# Patient Record
Sex: Female | Born: 1970 | Race: White | Hispanic: No | Marital: Married
Health system: Midwestern US, Community
[De-identification: ages and names within clinical notes are randomized; demographics above are authoritative.]

## PROBLEM LIST (undated history)

## (undated) DIAGNOSIS — I209 Angina pectoris, unspecified: Secondary | ICD-10-CM

## (undated) DIAGNOSIS — R928 Other abnormal and inconclusive findings on diagnostic imaging of breast: Secondary | ICD-10-CM

## (undated) DIAGNOSIS — R1011 Right upper quadrant pain: Secondary | ICD-10-CM

## (undated) DIAGNOSIS — J4540 Moderate persistent asthma, uncomplicated: Secondary | ICD-10-CM

## (undated) DIAGNOSIS — T17308A Unspecified foreign body in larynx causing other injury, initial encounter: Secondary | ICD-10-CM

## (undated) DIAGNOSIS — R110 Nausea: Secondary | ICD-10-CM

## (undated) DIAGNOSIS — I2510 Atherosclerotic heart disease of native coronary artery without angina pectoris: Secondary | ICD-10-CM

## (undated) DIAGNOSIS — R9431 Abnormal electrocardiogram [ECG] [EKG]: Secondary | ICD-10-CM

## (undated) DIAGNOSIS — K219 Gastro-esophageal reflux disease without esophagitis: Secondary | ICD-10-CM

## (undated) DIAGNOSIS — N200 Calculus of kidney: Secondary | ICD-10-CM

## (undated) DIAGNOSIS — M546 Pain in thoracic spine: Secondary | ICD-10-CM

## (undated) DIAGNOSIS — G35 Multiple sclerosis: Secondary | ICD-10-CM

## (undated) DIAGNOSIS — K1379 Other lesions of oral mucosa: Secondary | ICD-10-CM

## (undated) DIAGNOSIS — Z1231 Encounter for screening mammogram for malignant neoplasm of breast: Secondary | ICD-10-CM

## (undated) DIAGNOSIS — M859 Disorder of bone density and structure, unspecified: Secondary | ICD-10-CM

## (undated) DIAGNOSIS — R52 Pain, unspecified: Secondary | ICD-10-CM

## (undated) DIAGNOSIS — S0990XA Unspecified injury of head, initial encounter: Secondary | ICD-10-CM

## (undated) DIAGNOSIS — R109 Unspecified abdominal pain: Secondary | ICD-10-CM

---

## 1998-02-17 NOTE — Op Note (Signed)
[facility]                                OPERATION REPORT   NAME:     [name] [name], [name] [name]   MR#:      [id number]                          DATE:   SS#                                         PT. LOCATION:   Leonia Reeves, M.D.   cc:   Leonia Reeves, M.D.   PREOPERATIVE DIAGNOSIS:   Left breast mass   Intrauterine pregnancy of about [redacted] weeks gestation   POSTOPERATIVE DIAGNOSIS:   Milk adenoma, left breast, by frozen section   PROCEDURE:   Incisional biopsy of left breast mass   SURGEON:   Burna Forts, MD   ANESTHESIA:   Dr. Deanne Coffer (IV sedation and monitoring)  and  1% lidocaine solution and .25%   Marcaine solution as local anesthetics   FINDINGS:   This 28 year old gravida 1 para 0 abortus  0 patient, who is about [redacted] weeks   gestation, presented with left breast  mass  which has progressively gotten   bigger.   Patient was concerned as she  has  a  family  history  of  breast   cancer.   At  this  point in her pregnancy,  after  discussion,  suggesting   biopsy be done after her pregnancy,  as  it  appeared  the  mass  could  be   benign, she requested biopsy as soon  as  possible  because  of  concern of   breast cancer.   The mass was located subareolarly at about  9:00 o'clock in the left breast   area.  Mass measures approximately 2.5  to  3 cm. in diameter.  It is firm,   nontender and there is no evidence of interim changes noted.   PROCEDURE:   With the patient under IV sedation, the entire left breast area was prepped   with Betadine and draped in sterile manner.   The mass was then marked with   marking pen and an outline of incision  was made with the marking pen along   the  areolar  margin  in  the 9:00 o'clock  position.   Then  1%  Xylocaine   solution was injected around the areolar  mass  and  incision was then made   through the skin and subcutaneous tissues  along  the  edge  of the areola.    The mass was identified and this was excised.  The bleeders were controlled   with  electrocautery.   With  good  hemostasis   achieved,  the  wound  was   irrigated with saline solution.  The  incision  was  then approximated with   2-0 chromic catgut.  Skin was closed  subcuticularly  with 4-0 nylon.  Skin   was  reinforced  with  __________________.   Sterile  dressings  were  than   applied over the wound.   Sponge count, needle count and instrument  count were correct at the  end of   the procedure.  Patient tolerated the  procedure  well  and  was  taken  to   recovery room in satisfactory condition.   Frozen section report by Dr. _____________ was "consistent with adenoma."   ___________________________   Leonia Reeves, M.D.   vmt  D:  02/17/1998  T:  03/03/1998  2:36 P   [id number]

## 1998-03-03 NOTE — Op Note (Signed)
 [facility]                                OPERATION REPORT   NAME:     [name] [name], [name] [name]   MR#:      [id number]                          DATE:   SS#                                         PT. LOCATION:   MADELIN MANNHEIM, M.D.   cc:   MADELIN MANNHEIM, M.D.   PREOPERATIVE DIAGNOSIS:   Left breast mass   Intrauterine pregnancy of about [redacted] weeks gestation   POSTOPERATIVE DIAGNOSIS:   Milk adenoma, left breast, by frozen section   PROCEDURE:   Incisional biopsy of left breast mass   SURGEON:   ALONSO MANNHEIM, MD   ANESTHESIA:   Dr. Herma (IV sedation and monitoring)  and  1% lidocaine solution and .25%   Marcaine solution as local anesthetics   FINDINGS:   This 28 year old gravida 1 para 0 abortus  0 patient, who is about [redacted] weeks   gestation, presented with left breast  mass  which has progressively gotten   bigger.   Patient was concerned as she  has  a  family  history  of  breast   cancer.   At  this  point in her pregnancy,  after  discussion,  suggesting   biopsy be done after her pregnancy,  as  it  appeared  the  mass  could  be   benign, she requested biopsy as soon  as  possible  because  of  concern of   breast cancer.   The mass was located subareolarly at about  9:00 o'clock in the left breast   area.  Mass measures approximately 2.5  to  3 cm. in diameter.  It is firm,   nontender and there is no evidence of interim changes noted.   PROCEDURE:   With the patient under IV sedation, the entire left breast area was prepped   with Betadine and draped in sterile manner.   The mass was then marked with   marking pen and an outline of incision  was made with the marking pen along   the  areolar  margin  in  the 9:00 o'clock  position.   Then  1%  Xylocaine   solution was injected around the areolar  mass  and  incision was then made   through the skin and subcutaneous tissues  along  the  edge  of the areola.   The mass was identified and this was excised.  The bleeders were  controlled   with  electrocautery.   With  good  hemostasis   achieved,  the  wound  was   irrigated with saline solution.  The  incision  was  then approximated with   2-0 chromic catgut.  Skin was closed  subcuticularly  with 4-0 nylon.  Skin   was  reinforced  with  __________________.   Sterile  dressings  were  than   applied over the wound.   Sponge count, needle count and instrument  count were correct at the  end of   the procedure.  Patient tolerated the  procedure  well  and  was  taken  to   recovery room in satisfactory condition.   Frozen section report by Dr. _____________ was consistent with adenoma.   ___________________________   MADELIN MANNHEIM, M.D.   vmt  D:  02/17/1998  T:  03/03/1998  2:36 P   [id number]

## 1999-04-24 NOTE — Op Note (Signed)
[name]                                OPERATION REPORT   NAME:         [name] [name], [name] [name]   MR #:         [id number]                    DATE:           04/24/1999   BILLING #:    [id number]                   PT. LOCATION:   OR  OR37   SS #          [id number]   JEFFREY Neoma Laming, M.D.   cc:   JEFFREY Neoma Laming, M.D.(2 COPIES)         Lynann Bologna, M.D.   PREOPERATIVE DIAGNOSIS:   1.  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   2.  Allergic rhinosinusitis.   POSTOPERATIVE DIAGNOSIS:   Same   OPERATION:   1.  Diagnostic nasal sinus endoscopy.   2.  Right endoscopic nasal polypectomies with Microdebrider.   3.  Bilateral endoscopic revision maxillary antrostomies with removal of         the mucous membrane contents.   4.  Bilateral endoscopic total ethmoidectomies with Microdebrider.   5.  Right endoscopic frontal sinusotomy with removal of polypoid tissue and         stenting of frontal nasal duct.   SURGEON:   Dr. Lowell Guitar   ASSISTANT:   Gae Gallop, RN   ANESTHESIA:   General endotracheal anesthesia, Dr. Manson Passey supplemented with 4% topical   cocaine, 2% lidocaine, with 1:100,000 epinephrine (6 cc) and 2% lidocaine   with 1:50,000 epinephrine (4 cc).   ESTIMATED BLOOD LOSS:   Minimal   PACKING:   Bilateral Merogel, bilateral Bactroban ointment, bilateral nasal packs   COMPLICATIONS:   None   INDICATIONS:   This is a pleasant 29 year old white female who is followed by  [facility] for sinonasal disease and   allergies.  She was initially seen on November 23, 1997 when her family   physician referred her for allergies and sinus problems.  In summary, she   was found to have nasal polyps and chronic sinusitis and multiple   allergies.  She had undergone past surgery at [facility] in   [place] in the form of septoplasty and some    sinus surgery, but were not able to obtain the old records.  She moved from   [place], [place] in May 1999 to [place].  She had been seen by   an allergist in [place] and was on allergy injections from age 63   years to age 76 years.  She complains of nasal congestion, sneezing, watery   itchy eyes and rhinorrhea.  She gets multiple sinus infections in the past.   Now she complains of left sided otalgia and tinnitus but no vertigo.   Past surgical history included endoscopic sinus surgery with inferior   turbinate resection in [place], [place] in January 1998.  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  We started her on Amoxicillin for an acute   infection, Nasonex nose spray, saline irrigation and Entex LA.  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  The ethmoid showed near complete opacification on the right   and partial on the left.  She also had near complete opacification of the   right maxillary sinus and a large polyp on the left maxillary sinus.  The   sphenoids were clear.  We also did an allergy workup on her November 23, 1998 and she was found to be a really allergic individual.  She had   positive reactions to white ash, [place] grass, blue grass, cedar, fescue,   Johnson grass, Lambs quarters, oak, pigweed, ragweed, red top, sheep's   sorrel, Timothy pollen, cat hair, dog hair, Alternaria, Aspergillus,   Cephalosporium, Fusarium, Helminthosporium, Hormodendrum, Penicillium,   Stephthelium, Candida, Trichophyte,  Epidermophyton and house dust mix and    dust mites.  Her immunoglobulin level showed a massive elevation of her Ig   to 279 with normal range of 0 to 59.  We started her again on intranasal   steroid sprays of Nasonex because of her nasal polyposis and switched her   to Allegra and started her on allergy shots.  Her nasal polyps decreased in   size, but have not completely resolved.  Because of her situation we have   recommended that we proceed with endoscopic sinus surgery.  She concurs   with that and because of the repeat infections, headaches and other   symptomatology has requested that we proceed.   Note #1.  Informed consent was obtained from the patient.   Note #2.  Preoperative vital signs - blood pressure 108/79, pulse 77,   respirations 16, temperature 97.5, height 5'0", weight 58 kg.   Note #3.  Preoperative lab data - CBC shows white blood cell count 9,200,   RBC 4.32, hemoglobin 13.2, hematocrit 38.8 with normal indices.  Platelets   379,000.  Prothrombin time for patient 13.1, control 13.9.  PTT for patient   34.0, control 31.5 with an INR of 0.9.  Urinalysis was within normal   limits.   DESCRIPTION OF PROCEDURE:  The patient was brought to the operating room   and placed in a supine position.  After satisfactory general anesthesia was   established via oral endotracheal intubation, the patient was given a gram   of Kefzol IV and 12 mg of Decadron IV.  She then had the vibrissae of her   nose clipped with Iris scissors.  The nose was then lightly packed with 4%   topical cocaine on cottonoids.  After satisfactory initiation of  mucosal   vasoconstriction the cottonoids were removed and the nasal septum was   infiltrated in the usual manner with a total of 6 cc of 2% Lidocaine with   1:100,000 epinephrine.  Then the lateral nasal wall and uncinate process on   each side were infiltrated with a total of 2 cc per side for a grand total   of 4 cc of 2% lidocaine with 1:50,000 epinephrine.  After this the patient  underwent a standard surgical prepping and draping of her face and nose for   endoscopic sinus surgery.   Surgery was begun by performing a careful diagnostic nasal sinus endoscopy   with the 0 degree endoscope.  The external nose was photographed showing   some lack of tip support.  The scope was passed into the left side of the   nose showing there had been a resection of the inferior turbinate partially   as well as partial resection of the middle turbinate.  There was a high   inferior wall to the maxillary antrostomy and there were large polyps in   the left maxillary sinus, especially anteriorly which looked to be filled   with mucopus.  The ethmoid had also polyps in its mucosal area.   The   eustachian tube orifice and nasopharynx were normal.  The scope was then   passed into the right side of the nose showing the right maxillary sinus to   be full of fluid and massive polyps.  The ethmoid was totally obliterated   with polyps and no frontal recess could even be seen.  The eustachian tube   orifice and nasopharynx were normal.  We felt we could work around the   septum without having to revise her septum at this time.   After this using the 0 degree endoscope we went into the left side of the   nose and lowered the inferior wall of the maxillary antrostomy and the back   wall.  Then switching to the 70 degree scope we went into the left   maxillary sinus and cleaned out all the diseased mucosa and the large   mucopyocele that was anteriorly.  After this with a Microdebrider selected   air cells were revised and cleansed in the left ethmoid.  Care was taken   not to disturb the frontal nasal duct which was clear on the CT scan.  We   did not disturb the osteoma since it was not obstructing.  We then entered   into the right side of the nose and with the Microdebrider removed all of   the free standing nasal polyps in the posterior nasal cavity.  Then we went    into the right maxillary sinus and with the Microdebrider removed all the   nasal polyps along with suction debridement and enlarged the maxillary   antrostomy opening with back biting and up biting Blakesley-Wilde forceps.   There were multiple large pus filled polyps especially in the roof of the   right maxillary sinus.  Once this was done we went with the Microdebrider   and revised the entire ethmoid.  A lot of the posterior ethmoid air cells   and superior cells and Arginase cells had not been removed.  These were all   filled with polyps.  Once all these cells were removed we found the frontal   nasal duct.  There was a large purulent polyp actually obstructing the   duct.  This polyp was then grabbed with Kuhne sinus forceps and removed   inferiorly, thus allowing release of the air fluid level in the right   frontal sinus.  Then using the 60 degree Microdebrider blade, we enlarged   the frontal nasal duct anteriorly and laterally.  We had a good wide open   duct and one could see some polypoid mucosal changes on the anterior table   and posterior table of the frontal sinus, but the drainage opening  was now   widely patent.  This drainage channel was then stented open with rolled   Gelfilm.  All sinus surgical sites were packed with Merogel, Bactroban   ointment, then bilateral nasal packs were applied, nasal drip pad was   applied and this completed the procedure on the patient.   The patient was then returned to anesthesia where the patient was awakened   from anesthesia, extubated and taken to the recovery room in excellent   condition.  Blood loss on the case was minimal and there were no   complications from the anesthetic or surgery; tolerated both quite well.   The family members were informed of the operative findings.  The   postoperative instructions were reviewed with them in detail.  The patient   was issued a typewritten sheet of instructions on her nasal and sinus    surgery along with three prescriptions:  Zithromax Pak to use one pack as   directed for five days.  Since she is sensitive to Percocet and Demerol we   are giving her Lortab.  We will try that for today, one tablet every four   hours as needed for pain and Phenergan 25 mg suppositories one q 6 hours   p.r.n. postoperative nausea.  We will have her in the office tomorrow   morning at 9 a.m. to remove her packing on Wednesday April 25, 1999.

## 1999-05-01 NOTE — Op Note (Signed)
 [name]                                OPERATION REPORT   NAME:         [name] [name], [name] [name]   MR #:         [id number]                    DATE:           04/24/1999   BILLING #:    [id number]                   PT. LOCATION:   OR  OR37   SS #          [id number]   [name] [name], M.D.   cc:   [name] [name], M.D.(2 COPIES)         [name] [name], M.D.   PREOPERATIVE DIAGNOSIS:   1.  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   2.  Allergic rhinosinusitis.   POSTOPERATIVE DIAGNOSIS:   Same   OPERATION:   1.  Diagnostic nasal sinus endoscopy.   2.  Right endoscopic nasal polypectomies with Microdebrider.   3.  Bilateral endoscopic revision maxillary antrostomies with removal of         the mucous membrane contents.   4.  Bilateral endoscopic total ethmoidectomies with Microdebrider.   5.  Right endoscopic frontal sinusotomy with removal of polypoid tissue and         stenting of frontal nasal duct.   SURGEON:   Dr. MONTE   ASSISTANT:   Gib Sharps, RN   ANESTHESIA:   General endotracheal anesthesia, Dr. Delores supplemented with 4% topical   cocaine, 2% lidocaine, with 1:100,000 epinephrine (6 cc) and 2% lidocaine   with 1:50,000 epinephrine (4 cc).   ESTIMATED BLOOD LOSS:   Minimal   PACKING:   Bilateral Merogel, bilateral Bactroban ointment, bilateral nasal packs   COMPLICATIONS:   None   INDICATIONS:   This is a pleasant 29 year old white female who is followed by  [name] for sinonasal disease and   allergies.  She was initially seen on November 23, 1997 when her family   physician referred her for allergies and sinus problems.  In summary, she   was found to have nasal polyps and chronic sinusitis and multiple   allergies.  She had undergone past surgery at [facility] in   [place], [place]  years ago in the form of septoplasty and some   sinus  surgery, but were not able to obtain the old records.  She moved from   [place], [place]  in May 1999 to [place].  She had been seen by   an allergist in [place]  and was on allergy injections from age 44   years to age 38 years.  She complains of nasal congestion, sneezing, watery   itchy eyes and rhinorrhea.  She gets multiple sinus infections in the past.   Now she complains of left sided otalgia and tinnitus but no vertigo.   Past surgical history included endoscopic sinus surgery with inferior   turbinate resection in [place], [place]  in January 1998.  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  We started her on Amoxicillin for an acute   infection, Nasonex nose spray, saline irrigation and Entex LA.  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  The ethmoid showed near complete opacification on the right   and partial on the left.  She also had near complete opacification of the   right maxillary sinus and a large polyp on the left maxillary sinus.  The   sphenoids were clear.  We also did an allergy workup on her November 23, 1998 and she was found to be a really allergic individual.  She had   positive reactions to white ash, [place] grass, blue grass, cedar, fescue,   Johnson grass, Lambs quarters, oak, pigweed, ragweed, red top, sheep's   sorrel, Timothy pollen, cat hair, dog hair, Alternaria, Aspergillus,   Cephalosporium, Fusarium, Helminthosporium, Hormodendrum, Penicillium,   Stephthelium, Candida, Trichophyte,  Epidermophyton and house dust mix and   dust mites.  Her immunoglobulin level showed a massive elevation of her Ig   to  279 with normal range of 0 to 59.  We started her again on intranasal   steroid sprays of Nasonex because of her nasal polyposis and switched her   to Allegra and started her on allergy shots.  Her nasal polyps decreased in   size, but have not completely resolved.  Because of her situation we have   recommended that we proceed with endoscopic sinus surgery.  She concurs   with that and because of the repeat infections, headaches and other   symptomatology has requested that we proceed.   Note #1.  Informed consent was obtained from the patient.   Note #2.  Preoperative vital signs - blood pressure 108/79, pulse 77,   respirations 16, temperature 97.5, height 5'0, weight 58 kg.   Note #3.  Preoperative lab data - CBC shows white blood cell count 9,200,   RBC 4.32, hemoglobin 13.2, hematocrit 38.8 with normal indices.  Platelets   379,000.  Prothrombin time for patient 13.1, control 13.9.  PTT for patient   34.0, control 31.5 with an INR of 0.9.  Urinalysis was within normal   limits.   DESCRIPTION OF PROCEDURE:  The patient was brought to the operating room   and placed in a supine position.  After satisfactory general anesthesia was   established via oral endotracheal intubation, the patient was given a gram   of Kefzol IV and 12 mg of Decadron IV.  She then had the vibrissae of her   nose clipped with Iris scissors.  The nose was then lightly packed with 4%   topical cocaine on cottonoids.  After satisfactory initiation of  mucosal   vasoconstriction the cottonoids were removed and the nasal septum was   infiltrated in the usual manner with a total of 6 cc of 2% Lidocaine with   1:100,000 epinephrine.  Then the lateral nasal wall and uncinate process on   each side were infiltrated with a total of 2 cc per side for a grand total   of 4 cc of 2% lidocaine with 1:50,000 epinephrine.  After this the patient   underwent  a standard surgical prepping and draping of her face and nose for   endoscopic sinus surgery.    Surgery was begun by performing a careful diagnostic nasal sinus endoscopy   with the 0 degree endoscope.  The external nose was photographed showing   some lack of tip support.  The scope was passed into the left side of the   nose showing there had been a resection of the inferior turbinate partially   as well as partial resection of the middle turbinate.  There was a high   inferior wall to the maxillary antrostomy and there were large polyps in   the left maxillary sinus, especially anteriorly which looked to be filled   with mucopus.  The ethmoid had also polyps in its mucosal area.   The   eustachian tube orifice and nasopharynx were normal.  The scope was then   passed into the right side of the nose showing the right maxillary sinus to   be full of fluid and massive polyps.  The ethmoid was totally obliterated   with polyps and no frontal recess could even be seen.  The eustachian tube   orifice and nasopharynx were normal.  We felt we could work around the   septum without having to revise her septum at this time.   After this using the 0 degree endoscope we went into the left side of the   nose and lowered the inferior wall of the maxillary antrostomy and the back   wall.  Then switching to the 70 degree scope we went into the left   maxillary sinus and cleaned out all the diseased mucosa and the large   mucopyocele that was anteriorly.  After this with a Microdebrider selected   air cells were revised and cleansed in the left ethmoid.  Care was taken   not to disturb the frontal nasal duct which was clear on the CT scan.  We   did not disturb the osteoma since it was not obstructing.  We then entered   into the right side of the nose and with the Microdebrider removed all of   the free standing nasal polyps in the posterior nasal cavity.  Then we went   into the right maxillary sinus and with the Microdebrider removed all the   nasal polyps along with suction debridement and enlarged the maxillary    antrostomy opening with back biting and up biting Blakesley-Wilde forceps.   There were multiple large pus filled polyps especially in the roof of the   right maxillary sinus.  Once this was done we went with the Microdebrider   and revised the entire ethmoid.  A lot of the posterior ethmoid air cells   and superior cells and Arginase cells had not been removed.  These were all   filled with polyps.  Once all these cells were removed we found the frontal   nasal duct.  There was a large purulent polyp actually obstructing the   duct.  This polyp was then grabbed with Kuhne sinus forceps and removed   inferiorly, thus allowing release of the air fluid level in the right   frontal sinus.  Then using the 60 degree Microdebrider blade, we enlarged   the frontal nasal duct anteriorly and laterally.  We had a good wide open   duct and one could see some polypoid mucosal changes on the anterior table   and posterior table of the frontal sinus, but the drainage opening  was now   widely patent.  This drainage channel was then stented open with rolled   Gelfilm.  All sinus surgical sites were packed with Merogel, Bactroban   ointment, then bilateral nasal packs were applied, nasal drip pad was   applied and this completed the procedure on the patient.   The patient was then returned to anesthesia where the patient was awakened   from anesthesia, extubated and taken to the recovery room in excellent   condition.  Blood loss on the case was minimal and there were no   complications from the anesthetic or surgery; tolerated both quite well.   The family members were informed of the operative findings.  The   postoperative instructions were reviewed with them in detail.  The patient   was issued a typewritten sheet of instructions on her nasal and sinus   surgery along with three prescriptions:  Zithromax Pak to use one pack as   directed for five days.  Since she is sensitive to Percocet and Demerol we   are giving her Lortab.   We will try that for today, one tablet every four   hours as needed for pain and Phenergan 25 mg suppositories one q 6 hours   p.r.n. postoperative nausea.  We will have her in the office tomorrow   morning at 9 a.m. to remove her packing on Wednesday April 25, 1999.

## 2000-03-14 NOTE — Progress Notes (Signed)
CORPORATE CARE CLINICAL SUMMARY   NAME:    [name] [name], [name] [name]                               SS#:        [id number]   [id number]   DOB:     08/29/1970                                   AGE:        30   SEX:     F                                            LOCATION:CORPORATE   CARE   MR#:     [id number]                                     DATE:       02/08/200   2   DICTATING PHYSICIAN:  Angus Palms, D.O.   REFERRING PHYS:   The patient is here for right maxillary pressure and drainage over the last   2-3 days, green, with feeling tired, mild cough but no fever or chills.   Also possibly pregnancy as she did a home test which was positive as her   menstrual cycle is five days late and she is usually very regular.  Gravida   I, 0,0.  No GI symptoms.   ALLERGIES:  Demerol.   MEDICATIONS:  None.   PHYSICAL EXAMINATION:   VITAL SIGNS:  Blood pressure 108/70, temperature 97.8, pulse 64,   respirations 16, 5'0" tall, 124 pounds.   HEENT:  Examination shows sinus redness and congestion, slightly sore high   anterior nodes.  Ears normal.  Throat red without pus.  She is hydrated.   Note no abdominal pain.   She is on allergy shots per Dr. Lowell Guitar.   ASSESSMENT:   1.  Amenorrhea.   2.  Maxillary sinusitis.   PLAN:  Amoxicillin 500 mg t.i.d. times ten days and urine HCG checked is   negative, compared to her home test, which was positive.  Will check serum   HCG then further advise and discuss what OTC medicines are safe in   pregnancy.

## 2000-07-25 NOTE — Progress Notes (Signed)
CORPORATE CARE CLINICAL SUMMARY   NAME:    [name] [name], [name] [name]                         SS#:        [id number]   DOB:     06/17/1970                             AGE:        30   SEX:     F                                      LOCATION:   CORPORATE CARE   MR#:     [id number]                               DATE:       07/25/2000   DICTATING PHYSICIAN:  Waynetta Sandy, M.D.   REFERRING PHYS:   CHIEF COMPLAINT:   Nasal congestion.   HISTORY OF PRESENT ILLNESS:   Ms. Borenstein is a 30 year old female with a past   medical history of fibromyalgia and allergies who presents with a two day   history of nasal congestion and sinus pressure.   The patient states that   she has associated headaches, sore throat, no cough noted though.   She has   not had any sputum production from her nose and denies any fever, chills,   but has tried Duratuss which she says has not helped at all.   In the past   she has tried Claritin D and Allegra but is currently on Clarinex.   She is   status post sinus surgery times two, most recent procedure done in 2001.   She has allergies to Demerol.   CURRENT MEDICATIONS:   Elavil, Neurontin, birth control pills, Clarinex and   Nasonex.   PHYSICAL EXAMINATION:   VITAL SIGNS:   Her temperature is 98.0, pulse 100, respirations 20, blood   pressure 80/60.   GENERAL:   She is a well developed, well nourished female who appears to be   in no acute distress.   HEENT:   Normocephalic and atraumatic.   Extraocular muscles are intact.   Pupils are equally round and reactive to light.   Nares are patent with   moist mucosal membranes.   Tympanic membranes are clear but mottled in   appearance.   No erythema presently.   No facial pressure to palpation in   the facial region but mild facial pressure to palpation in the right   maxillary sinus region.   NECK:   Is supple without lymphadenopathy.   No thyromegaly.   LUNGS:   Are clear to auscultation bilaterally.    CARDIOVASCULAR:   Regular rate and rhythm with no murmurs or gallops noted.   ASSESSMENT:   1.  Allergic rhinitis.   2.  Chronic sinusitis.   PLAN:   Patient was reassured that this does not appear to be a sinus infection   currently.   She is to continue her Duratuss as previously prescribed.   A   prescription was given to the patient for Z-Pak to be started if there was   an increase in her temperature or if the symptoms  continue to persist.   She is to follow-up with Dr. ___________ for change in her antihistamine.

## 2000-09-03 NOTE — Procedures (Signed)
[facility]                                 PROCEDURE NOTE   NAME:     [name] [name]   MR #:     [id number]                          DATE:   SS #      [id number]                       PT. LOCATION:     [id number]   DOUGLAS H. HOWERTON, M.D.   cc:   DOUGLAS H. HOWERTON, M.D.   Extra copies to office:  0   PROCEDURE:   Flexible sigmoidoscopy.   SURGEON:   Douglas H. Howerton, M.D.   INDICATIONS FOR PROCEDURE:   Abdominal pain, rectal bleeding, leukocytosis.   ASA score prior to the   procedure is 1.   MEDICATIONS:   Fentyl 75 mg IV.   Versed 2 mg IV.   FINDINGS:   After informed consent was obtained from the patient, the patient was   placed in the left lateral decubitus position and premedicated.   After the   patient was adequately sedated, PCF-140L colonoscope was passed to the mid   transverse colon, originally it was identified by triangular folds.   Slow   withdrawal of the pediatric colonoscope from this region revealed almost   confluent inflammation and ulcer extending from the splenic flexure to 37   centimeter above the dentate line.   There was surface ulceration was well.   There was eccentrically located.   Multiple biopsies were performed.   The   endoscopic appearance was consistent with mild ischemic colitis.   Internal   hemorrhoids were observed at the anorectal junction during endoscopic   withdrawal.   IMPRESSION:   1.  Mild ischemic colitis.   2.  Internal hemorrhoids.   PLAN:   1.  Full liquid diet.   2.  Continue p.r.n. antibiotics.   3.  Home tomorrow if stable.

## 2000-09-03 NOTE — Discharge Summary (Signed)
[facility]                                DISCHARGE SUMMARY   NAME:     [name] [name], [name] [name]   MR #:     [id number]                            ADM DATE:       09/03/2000   SS #      [id number]                         DIS DATE:       09/08/2000   Yisroel Ramming, M.D.   cc:   Yisroel Ramming, M.D.   PRINCIPAL DIAGNOSIS:   1.  Mild left sided ischemic colitis.   2.  Status post left inguinal hernia repair.   PLAN:   1.  Flagyl 250 mg q.i.d. for 4 days.   2.  Continue medication at home, including Neurontin, 900 mg q.h.s. and         Elavil 50 mg q.h.s.   3.  Avoid birth control pills in the future.   4.  Follow up p.r.n.   HISTORY OF PRESENT ILLNESS:    This is a pleasant 30 year old white female   who was admitted to [facility] by Dr. Perry Mount   09/03/2000 for evaluation of GI bleeding. This patient developed severe   infraumbilical and left sided abdominal pain, colicky in nature, followed   subsequently by a large volume repeated or recurrent rectal bleeding.  The   patient had no prior history of abdominal pain or GI bleeding.   PHYSICAL EXAMINATION:   The physical examination on admission revealed a   middle age female in mild distress.   VITAL SIGNS:  Blood pressure of 96/60,  pulse 60,respirations 16 and   temperature of 96.1 degrees Fahrenheit.   HEENT:  Examination was normal.   LUNGS:  The lungs were clear to auscultation.   CARDIAC:  Examination revealed a regular rate and rhythm with no murmur.   ABDOMEN:  The abdomen was soft, tender in the emergency room on the left   side, without a palpable mass.  Bowel sounds  were active throughout.   RECTAL:  Examination revealed Hemoccult positive brown stool.   EXTREMITIES:  The extremities were without cyanosis or edema.   ADMITTING LABORATORIES:  Included a white blood cell count of 14.5,   hemoglobin of 12.5, platelet count of 408,000, normal liver enzymes, BUN    was 9, and a creatinine of .9. Stool samples were all unremarkable   including Clostridium difficile toxin assays and O &amp; P stains.   Biopsy from the colon revealed changes consistent with either   pseudomembranous or ischemic colitis.   HOSPITAL COURSE:  The patient was admitted to the hospital  and underwent   flexible sigmoidoscopy to the mid  transverse colon.  This was performed   the day following admission.  Examination revealed inflammation in the left   colon from 30 centimeters to the splenic flexure, inflammation that was   nonconfluent and eccentrically located with surface ulceration, likely   related to ischemic colitis.  There were no pseudomembranes in the rest of   the colon. The mucosa above this region and below was normal.  Internal  hemorrhoids were visualized at the anorectal junction.   Note that biopsies   were performed in the area of presumed ischemic colitis prior to completion   of the examination.   The patient was admitted to a regular nursing floor where she was kept on a   liquid diet.  Over the next several days, the patient's  symptoms gradually   improved.   At the time of hospital discharge, she was tolerating liquid diet and was   not experiencing abdominal pain and had no further rectal bleeding.   DISCHARGE PLANS:  As dictated above, the patient will be  asked to return   on a p.r.n. basis in the future.

## 2000-09-03 NOTE — Consults (Signed)
[facility]                               CONSULTATION REPORT                     CONSULTANT:  Larene Beach, M.D.   NAME:             [name] [name]   BILLING #:        [id number]              DATE OF CONSULT:       09/03/2000   MR #:             [id number]               ADM DATE:              09/03/2000   SS #              [id number]            PT. LOCATION:   ATTENDING:        Angus Palms, D.O.   cc:   Angus Palms, D.O.         Larene Beach, M.D.   COPY TO [address]   INDICATIONS:    GI bleeding.   This is a 30 year old white female previously healthy except for   fibromyalgia and sinus problems who had acute onset of rectal bleeding   after eating pork chops last evening which has continued.   Patient then   was brought to the ER for further evaluation where a white count was noted   to be evaluated at 15,300.   She is admitted for observation.   Patient   denies previous episodes of this.   She does have a left shift with 86%   granulocytes and 13% bands.   PAST MEDICAL HISTORY:    Includes no known allergies.   OPERATION:   Left inguinal hernia repair.   MEDICATIONS:   Neurontin 900 mg at bed time and Elavil 50 mg at bed time.   She also takes birth control pills, takes Claritin occasionally.   ALLERGIES:    Demerol which produced hives.   SOCIAL HISTORY:   Nonsmoker, nondrinker, has a 65-year-old child.   REVIEW OF SYSTEMS:   Denies chronic shortness of breath, chest pain,   nausea, vomiting on a chronic basis, burning on urination.   She does have   fibromyalgia.   Denies depression, anxiety on a chronic basis.   PHYSICAL EXAMINATION:   VITAL SIGNS:   Blood pressure 96/60, pulse 60, respirations 16, temperature   96.1.   GENERAL:   She is a flush faced well developed, well nourished white female   in no acute distress.   HEENT:   Sclerae clear, conjunctivae pink, EOMs intact, tongue is   papillated.    NECK:   Supple, no bruits.   LUNGS:   Clear.   HEART:   S1, S2 is normal.   No murmurs, gallops.   ABDOMEN:   Soft, no active bowel sounds.   Tender in the ER, tender on my   examination.   No palpable masses or organomegaly.   Bowel sounds are   present.   RECTAL EXAM:   By ER revealed Hemoccult positive, but not grossly bloody   stool.   EXTREMITIES:  No cyanosis, clubbing, calf tenderness or edema.   IMPRESSION:   1.  Rule out infectious gastroenteritis versus ischemia versus diverticular      bleed, versus polyp, versus remote chance of CA or AVM.   2.  Positive family history, maternal grandmother of colon cancer.   SUGGESTED PLAN:   Admit for culture, antibiotics and flexible sigmoidoscopy and when well is   explained to the patient future colonoscopy.   The risks of the procedure   have been explained to the patient and her husband and agree to proceed.   Electronically Signed By:   Larene Beach, M.D. 09/11/2000 16:02   _________________________________   Larene Beach, M.D.   eb  D:  09/03/2000  T:  09/03/2000  8:19 P   [id number]

## 2000-09-03 NOTE — Progress Notes (Signed)
CORPORATE CARE CLINICAL SUMMARY   NAME:    [name] [name], [name] [name]                           SS#:        [id number]   DOB:     02-04-1971                             AGE:        30   SEX:     F                                      LOCATION:   CORPORATE CARE   MR#:     [id number]                               DATE:   DICTATING PHYSICIAN:  Angus Palms, D.O.   REFERRING PHYS:   The patient is here for evaluation of rectal bleed which started on arising   this morning with cramps.  She has no prior history of this, no history of   hemorrhoids, and she had no bowel movement yesterday, which has usually   been normal.  She has no upper respiratory infection symptoms, no fever, no   vomiting.  She notes the cramps are off and on and it has been all blood   coming out when she passed it a few times.  This happened no more than   three times.   ALLERGIES:  DEMEROL.   MEDICATIONS:  Elavil, Neurontin, birth control pills.   PHYSICAL EXAMINATION:   VITAL SIGNS:  Blood pressure 96/60, temperature 96.1, pulse 64,   respirations 16.  Weight 5' tall, 136 pounds.   Examination shows tender diffuse throughout the abdomen except in the right   upper quadrant.  No mass, no organomegaly.  Positive bowel sounds heard and   no rebound.  She is hydrated.  Rectal examination in negative for any mass   or hemorrhoids or lesions, and is heme positive.   ASSESSMENT:   1.  Abdominal pain left lower quadrant.   2.  Rectal bleed.   PLAN:  Will check CBC and would hope that this is self limiting as she did   eat ice cream late last night and did take an Imitrex tablet yesterday for   a headache.   She notes Imitrex and ice cream have never bothered her before but will see   what CBC shows.   May need referral to GI if this does not self limit and stop on its own and   pending the CBC results.

## 2000-09-03 NOTE — Progress Notes (Signed)
CORPORATE CARE CLINICAL SUMMARY   NAME:    [name] [name], [name] [name]                         SS#:        [id number]   DOB:     09-01-1970                             AGE:        30   SEX:     F                                      LOCATION:   CORPORATE CARE   MR#:     [id number]                               DATE:   DICTATING PHYSICIAN:  Angus Palms, D.O.   REFERRING PHYS:   The patient's CBC result came in showing high white count with left shift.   Discussed with Dr. Federico Flake who will see the patient in consult in the   Emergency Room for this rectal bleed with leukocytosis of acute nature.

## 2000-09-04 NOTE — Procedures (Signed)
[facility]                                 PROCEDURE NOTE   NAME:     [name] [name], [name] [name]   MR #:     [id number]                          DATE:   SS #      [id number]                       PT. LOCATION:     [id number]   Yisroel Ramming, M.D.   cc:   Yisroel Ramming, M.D.   Extra copies to office:  0   PROCEDURE:   Flexible sigmoidoscopy.   SURGEON:   Jaynee Eagles. Howerton, M.D.   INDICATIONS FOR PROCEDURE:   Abdominal pain, rectal bleeding, leukocytosis.   ASA score prior to the   procedure is 1.   MEDICATIONS:   Fentyl 75 mg IV.   Versed 2 mg IV.   FINDINGS:   After informed consent was obtained from the patient, the patient was   placed in the left lateral decubitus position and premedicated.   After the   patient was adequately sedated, PCF-140L colonoscope was passed to the mid   transverse colon, originally it was identified by triangular folds.   Slow   withdrawal of the pediatric colonoscope from this region revealed almost   confluent inflammation and ulcer extending from the splenic flexure to 37   centimeter above the dentate line.   There was surface ulceration was well.   There was eccentrically located.   Multiple biopsies were performed.   The   endoscopic appearance was consistent with mild ischemic colitis.   Internal   hemorrhoids were observed at the anorectal junction during endoscopic   withdrawal.   IMPRESSION:   1.  Mild ischemic colitis.   2.  Internal hemorrhoids.   PLAN:   1.  Full liquid diet.   2.  Continue p.r.n. antibiotics.   3.  Home tomorrow if stable.

## 2001-01-08 NOTE — Progress Notes (Signed)
CORPORATE CARE CLINICAL SUMMARY   NAME:  [name] [name], [name] [name]                          SS#:        [id number]   DOB:    05-Mar-1970                               AGE:        30   SEX:    F                                     LOCATION:  CORPORATE CARE   MR#:    [id number]                                 DATE:       01/08/2001   DICTATING PHYSICIAN:  Otilio Carpen, M.D.   REFERRING PHYS:   The patient is a 30 -year-old white female who presents with complaints of   sinus congestion and sinus pressure.  Symptoms have been worsening over the   past several days.  OTC meds are not working.  The patient initially had   sore throat pain. She has thick greenish nasal discharge.  No fever or   chills.  No nausea or vomiting.  She has had occasional loose stools.   PAST MEDICAL HISTORY:  Is significant for fibromyalgia.   MEDICATIONS:  amitriptyline 50 milligrams p.o. daily, Neurontin 900   milligrams p.o. daily, Vioxx 50 milligrams p.o. q.day.   THE PATIENT IS ALLERGIC TO DEMEROL.   LMP was December 07, 2000 and was normal for her.  PHYSICAL EXAMINATION:   VITAL SIGNS:  Blood pressure is 124/82, pulse 76, respirations 18.   Temperature 97.8.  Height 5\' 0" .  Weight 143.   HEENT:  Normocephalic, atraumatic, PERRLA, EOMI.  Nose with boggy mucous   membranes and mucous discharge noted.  Sinuses tender to palpation left   greater than right.  Posterior pharynx clear.  TMs clear.  O significant   adenopathy.  HEENT otherwise within normal limits.   NECK:  Supple and nontender.  No significant cervical or supraclavicular   adenopathy.   HEART:  Is regular rate and rhythm without murmur.   LUNGS:  Clear to auscultation bilaterally.   EXTREMITIES:  Normal and nontender.   NEUROLOGICAL:  Intact.   ASSESSMENT:   1. Sinusitis, acute.   PLAN:   1. p.o. fluids, rest, Tylenol p.r.n.   2. Amoxicillin 500 milligrams p.o. t.i.d. x 10 days.   3. Sudafed p.r.n. for congestion.    4. Humibid-LA 1 p.o. b.i.d. p.r.n. congestion or cough.   5.  Call INI 5-7 days, prior if worse, and p.r.n.  Routine follow up   otherwise.   Electronically Signed By:   Otilio Carpen, M.D. 12/28/2001 10:14   _____________________________________________   Otilio Carpen, M.D.   hp  D: 01/08/2001  T: 01/08/2001  8:41 P    [id number]

## 2001-01-22 NOTE — Progress Notes (Signed)
CORPORATE CARE CLINICAL SUMMARY   NAME:  [name] [name], [name] [name]                       SS#:        [id number]   DOB:    11-22-1970                              AGE:        30   SEX:    F                                      LOCATION:  CORPORATE CARE   MR#:    [id number]                                DATE:       01/22/2001   DICTATING PHYSICIAN:  Angus Palms, D.O.   REFERRING PHYS:   The patient here with complaint of missed cycle 3 weeks now and notes that   she did 3 home test which were negative and wanted to be sure with blood   test.   She went off birth control in July of 2002 after her colitis.  She has had   a menses each month though slightly irregular.   She notes a lot of stress going on and 3 weeks ago she did have a full   blown sinus infection, treated with antibiotics.  This was when her last   cycle was due.  She is Para 1, 00 1.   ALLERGIES:  Demerol.   MEDICATIONS:  Amitriptyline, Neurontin, Vioxx.   PHYSICAL EXAMINATION   VITAL SIGNS:   Blood pressure 120/76, temperature 98.2, pulse 76,   respirations 18, height 5'2", weight 136 pounds.  Last menstrual period   12/07/00.   ABDOMEN:  Soft, nontender.  No masses or organomegaly.  No thyromegaly. No   lymphadenopathy.   GENITOURINARY:  No bladder or bowel difficulties.   ASSESSMENT:  Amenorrhea.   PLAN:  Will check serum hCG and then give further advise.   Electronically Signed By:   Angus Palms, D.O. 01/26/2001 08:30   _____________________________________________   Angus Palms, D.O.   jdm  D: 01/22/2001  T: 01/23/2001  4:56 P    [id number]

## 2001-02-26 NOTE — Progress Notes (Signed)
CORPORATE CARE CLINICAL SUMMARY   NAME:  [name] [name], [name] [name]                       SS#:        [id number]   DOB:    1970-07-03                              AGE:        30   SEX:    F                                      LOCATION:  CORPORATE CARE   MR#:    [id number]                                DATE:       02/26/2001   DICTATING PHYSICIAN:  Angus Palms, D.O.   The patient called noting she is having sleeping problems and wants to know   if her medicine can be changed.   She is currently on Amitriptyline and Neurontin.   Advised the patient to decrease the Amitriptyline to 25 mg q. hs and we   will add Ambien 10 mg #30 to take 1 p.o. q. hs p.r.n. sleep and let me know   if this works by next week.   Made a call to her pharmacy, [phone].   Electronically Signed By:   Angus Palms, D.O. 04/15/2001 16:46   _____________________________________________   Angus Palms, D.O.   sp  D: 02/26/2001  T: 02/27/2001  9:59 A    [id number]

## 2001-03-03 NOTE — Progress Notes (Signed)
CORPORATE CARE CLINICAL SUMMARY   NAME:  [name] [name], [name] [name]                          SS#:        [id number]   DOB:    Sep 02, 1970                               AGE:        30   SEX:    F                                     LOCATION:  CORPORATE CARE   MR#:    [id number]                                 DATE:       03/04/2001   DICTATING PHYSICIAN:  Otilio Carpen, M.D.   The patient is a 31 year old female who presents with complaints of sinus   congestion and sinus pressure in the left maxillary sinus area.  She also   complains of left ear pain.  She denies any fever or chills.  No nausea,   vomiting or diarrhea.  She does have a nasal discharge.   PAST MEDICAL HISTORY:  Significant for fibromyalgia and allergic rhinitis.   MEDICATIONS:   1. Neurontin 900 mg p.o. daily.   2. Elavil 25 mg q. h.s.   3. Ambien 5 mg q. h.s.   ALLERGIES:  Demerol.   The patient states that she has Nasonex and Claritin at home but she has   not been using this.  She also complains of having difficulty following   asleep.  She states this is relieved with the Ambien although now she feels   too groggy with this.   PHYSICAL EXAMINATION:  Blood pressure 100/60, pulse 84, respirations 20,   temperature 98.4, height 5'0", weight 142.   HEENT:  NC/AT, Pupils equal, round, reactive to light and accommodation,   extraocular movements are intact.  Nose with boggy mucous membranes and   mucous discharge noted.  Sinuses are tender to palpation and percussion.   Posterior pharynx clear.  TMs clear.  No significant adenopathy.  HEENT   otherwise within normal limits.   NECK:  Supple and nontender.  No significant cervical or supraclavicular   adenopathy.   HEART:  Regular rate and rhythm without murmur.   LUNGS:  Clear to auscultation bilaterally.   ABDOMEN:  Benign.   EXTREMITIES:  Normal and nontender.   NEUROLOGICAL:  Intact.   ASSESSMENT:   1. Sinusitis, acute.   2. Left otalgia possibly secondary to #1.   3. Allergic rhinitis.    4. Sleep disturbance with intermittent insomnia.   PLAN:   1. P.o. fluids, rest, Tylenol p.r.n.   2. Amoxicillin 500 mg p.o. t.i.d. x 10 days.  Humibid-LA 1 p.o. b.i.d.      p.r.n. congestion or cough.  Sudafed p.r.n. congestion.   3. The patient is to call if she is not improved over the next 5 to 7 days,      prior if worse, and p.r.n.   4. The patient is instructed to decrease her Ambien dose to   tablet and to  only take it on a p.r.n. basis as opposed to taking it daily.  She is to      see if this improves her symptoms and controls her sleep problems as      well.  Routine follow up otherwise.   Electronically Signed By:   Otilio Carpen, M.D. 12/28/2001 10:38   _____________________________________________   Otilio Carpen, M.D.   sp  D: 03/04/2001  T: 03/04/2001  9:57 A    [id number]

## 2001-03-26 NOTE — Progress Notes (Signed)
CORPORATE CARE CLINICAL SUMMARY   NAME:  [name] [name], [name] [name]                          SS#:        [id number]   DOB:    March 17, 1970                               AGE:        30   SEX:    F                                     LOCATION:  CORPORATE CARE   MR#:    [id number]                                 DATE:       03/26/2001   DICTATING PHYSICIAN:  Otilio Carpen, M.D.   The patient is a 31 year old female who presents with complaints of   headache since yesterday evening.  This is consistent with her usual   migraine.  She denies any fever or chills.  No vomiting or diarrhea.  She   does have slight nausea.  Her headache is right-sided in nature.  The   patient does have an ongoing symptom of having a bad taste in her mouth   prior to her headaches and she had this yesterday.  Her headache started   yesterday evening.  She had difficulty sleeping last night due to the   headache.  She rates her headache a 5/10 on a scale of 1 to 10 with 10   being the worst headache she has had.  Imitrex has worked for her in the   past.   PAST MEDICAL HISTORY:  Significant for fibromyalgia and allergic rhinitis.   ALLERGIES:  Demerol.   MEDICATIONS:   1. Neurontin 900 mg p.o. q.d.   2. Elavil 25 mg p.o. q. h.s.   3. Ambien 10 mg p.o. q. h.s.   REVIEW OF SYSTEMS:  Reveals that the patient was unable to rest well with   the   dose of Ambien, so is now back up to 10 mg as needed.  She denies any   numbness or weakness, coordination or balance changes or slurred speech.   Symptoms are like her usual migraines.   Last menstrual period was 03/01/01.   PHYSICAL EXAMINATION:  Blood pressure 94/66, pulse 72, respirations 16,   temperature 98.5, height 5'0", weight 141.   HEENT:  NC/AT, Pupils equal, round, reactive to light and accommodation,   extraocular movements are intact, fundi benign.  Nose with clear discharge   and boggy mucous membranes.  No significant adenopathy.  HEENT otherwise   within normal limits.    NECK:  Supple and nontender.  No significant cervical or supraclavicular   adenopathy.   HEART:  Regular rate and rhythm without murmur.   LUNGS:  Clear to auscultation bilaterally.   ABDOMEN:  Normoactive bowel sounds, soft, nontender, nondistended, without   masses or hepatosplenomegaly.   EXTREMITIES:  Normal and nontender.  Motor 5/5.  No sensory or motor   deficits.   NEUROLOGICAL:  Cranial nerves II-XII intact bilaterally.  The patient   appears not to feel well but  does not look toxic.   ASSESSMENT:   1. Migraine headache.   2. Nausea secondary to #1.   3. Fibromyalgia.   PLAN:   1. Toradol 30 mg intramuscular x 1 dose.  The patient is noted to tolerate      this well without adverse effect.   2. P.o. fluids, rest, Tylenol p.r.n.   3. Phenergan 25 mg p.o. q.6h. p.r.n. nausea (14 with no refill).   4. Imitrex 25 mg.  The patient is instructed to take 1 p.o. p.r.n.      migraine.  She may repeat in 2 hours as directed if needed to relieve      headache.   5. The patient is to call if she is not improved over the next 3 days,      prior if worse, and p.r.n.  Routine follow up otherwise.   Electronically Signed By:   Otilio Carpen, M.D. 12/28/2001 11:32   _____________________________________________   Otilio Carpen, M.D.   sp  D: 03/26/2001  T: 03/27/2001 10:54 A    [id number]

## 2001-04-09 NOTE — Progress Notes (Signed)
CORPORATE CARE CLINICAL SUMMARY   NAME:  [name] [name], [name] [name]                          SS#:        [id number]   DOB:    1970-06-21                               AGE:        31   SEX:    F                                     LOCATION:  CORPORATE CARE   MR#:    [id number]                                 DATE:       04/09/2001   DICTATING PHYSICIAN:  Otilio Carpen, M.D.   REFERRING PHYS:   The patient is a 31 -year-old female who presents with complaints of   congestion and cough.  She has had subjective fever and chills.  No nausea,   vomiting or diarrhea.  She has clear discharge.  Cough is dry.  Symptoms   have been present over the past 4 days.  OTC meds are not working.   PAST MEDICAL HISTORY:  Is significant for fibromyalgia and seasonal   allergies.   MEDICATIONS:  Neurontin 900 milligrams p.o. q.h.s., Claritin-D, Elavil 50   milligrams p.o. q.h.s.   ALLERGIES:  The patient is allergic to Demerol.   LMP was April 01, 2001 and normal for her.  The patient states that she   ran out of her Ambien. She had reduced her Elavil from 50 milligrams to 25   milligrams when she started taking her Ambien for sleep.  She has now   increased her Elavil back to 50 milligrams and feels that this is helping   her sleep.  She does not feel she needs the Ambien at present.   PHYSICAL EXAMINATION: VITAL SIGNS:  Blood pressure is 112/64, pulse 76,   respirations 18.  Temperature 99.9.  Height 5\' 3" .  Weight 138.   HEENT:  Normocephalic, atraumatic, PERRLA, EOMI.  Nose with boggy mucous   membranes and clear discharge.  Posterior pharynx clear.  TMs clear.  No   significant adenopathy.  HEENT otherwise within normal limits.   NECK:  Supple and nontender.  No significant cervical or supraclavicular   adenopathy.   HEART:  Is regular rate and rhythm.   LUNGS:  With coarse upper airway sounds, otherwise clear.   ABDOMEN:  Benign.   EXTREMITIES:  Normal and nontender.   NEUROLOGICAL:  Intact.   ASSESSMENT:    1. Upper respiratory infection.   2. Allergic rhinitis.   3. Fibromyalgia.   PLAN:   1. p.o. fluids, rest, Tylenol p.r.n.   2. Humibid-LA 1 p.o. b.i.d. p.r.n. for congestion or cough.  Claritin-D      daily as directed.   3. The patient is given a refill on her Elavil 50 milligrams p.o. q.h.s.      (30 with 1 refill).   4. The patient is to call if she is not improved in regard to her upper      respiratory  symptoms in the next 5 to 7 days, prior if worse, and p.r.n.      Routine follow up otherwise.   Electronically Signed By:   Otilio Carpen, M.D. 12/28/2001 12:41   _____________________________________________   Otilio Carpen, M.D.   hp  D: 04/09/2001  T: 04/09/2001  6:40 P    [id number]

## 2001-04-09 NOTE — Progress Notes (Signed)
CORPORATE CARE CLINICAL SUMMARY   NAME:  [name] [name], [name] [name]                          SS#:        [id number]   DOB:    08/12/1970                               AGE:        31   SEX:    F                                     LOCATION:  CORPORATE CARE   MR#:    [id number]                                 DATE:   DICTATING PHYSICIAN:  Angus Palms, D.O.   REFERRING PHYS:   The patient called requesting refill on her Neurontin and okayed Neurontin   300 milligrams #90 to take 1 p.o. t.i.d. with 2 refills.  Medication called   into [facility], [id number].   Electronically Signed By:   Angus Palms, D.O. 10/24/2001 09:25   _____________________________________________   Angus Palms, D.O.   hp  D: 04/30/2001  T: 04/30/2001  9:15 P    [id number]

## 2001-05-06 NOTE — Progress Notes (Signed)
CORPORATE CARE CLINICAL SUMMARY   NAME:  [name] [name], [name] [name]                          SS#:        [id number]   DOB:    Mar 12, 1970                               AGE:        31   SEX:    F                                     LOCATION:  CORPORATE CARE   MR#:    [id number]                                 DATE:       05/06/2001   DICTATING PHYSICIAN:  Angus Palms, D.O.   REFERRING PHYS:   Patient here for evaluation of ongoing diarrhea.  She notes she went to   [facility] several days ago for sinus infection and was treated with   Augmentin and within 24 hours, got diarrhea and nausea which has been   ongoing with any food or fluid she takes in her.  She is trying to keep up   hydration.   She notes she is pregnant with several home pregnancy tests and did see her   obstetrician 2 days ago.   She notes no fever, no chills, no bleeding and symptoms have been 5 days   total.   She was hospitalized for 5 days last summer with colitis and had to take   Flagyl.   ALLERGIES:   See chart.   MEDICATIONS:  See chart.   PHYSICAL EXAMINATION:   VITAL SIGNS:   BP 110/80, temperature 98.1, pulse 100, respirations 20, 5   feet, 131 pounds.   LMP 03/27/01.   ABDOMEN:  She has tenderness diffusely of the abdomen equally with no mass,   no organomegaly, no icterus or jaundice.   HEENT:  :  Her oral membranes appear moist.  ENT exam is negative.   NECK:  No lymphadenopathy.   HEART:  Regular without murmur.   LUNGS:  Clear.   ASSESSMENT:   Nausea, diarrhea and dehydration.   PLAN:  Urine test done does show ketones and CBC done STAT shows no anemia   with good white count.   Patient given off work note and today was treated with IV D-5W of 1 liter   over 3 hours.   Patient felt better afterwards and had no orthostasis and her urine test   after IV showed all normal, no ketones.   Patient given Phenergan 25 mg p.o. x 1 on presentation and rested and slept    a few hours while she got the IV and felt better with nausea.   She notes she has lost 7 pounds over 4 days and will slowly increase diet   with the BRAT diet which she is aware of.   Depending on the response of the colitis, if it resolves on its own, she   will be set and if not, she will contact OB about safe medicines to treat   the colitis such as Flagyl or Cipro.  She will keep in touch with me on   this too.   Note:  Urine test after IV normal.  CBC normal also.   Electronically Signed By:   Angus Palms, D.O. 11/10/2001 11:15   _____________________________________________   Angus Palms, D.O.   [email or web address].[email or web address]  D: 05/06/2001  T: 05/06/2001  7:40 P    [id number]

## 2001-05-07 NOTE — ED Provider Notes (Signed)
[facility]                      EMERGENCY DEPARTMENT TREATMENT REPORT   NAME:  WAVERLEY, KREMPASKY   MR #:         BILLING #: [id number]          DOS: 05/07/2001   TIME: 9:56 P   40-03-62   cc:    Angus Palms, D.O.          Karl Pock, M.D.   Primary Physician:  Tresa Endo, M.D.   TIME:   2100 hours.   CHIEF COMPLAINT:   Nausea, vomiting.   HISTORY OF PRESENT ILLNESS:   This is a 31 year old female that arrived to   the emergency department stating that she went to see Dr. Tresa Endo for a sinus   infection and found out that she was pregnant.  Last menstrual period   03/27/01.  Was given Augmentin and after the first dose, Sunday morning she   started having diarrhea and called Dr. Tresa Endo and stopped taking her   antibiotics as she has had a past medical history of pseudomembranous   colitis that she was admitted for in July 2002.  She also saw Dr. [name] for   her pregnancy.  He redid the urinalysis and it was positive. Tried to do a   vaginal ultrasound but stated that it was too early.  She wanted to know   what she could do for the diarrhea and he told her to take Kaopectate.   That did not work and 48 hours afterwards she went to see Dr. Tresa Endo,   received a liter of fluids with Phenergan and this was on Wednesday, and   today she is back because she has not felt better.  Has had nausea and   vomiting.  No fevers.  No sore throat.  No earaches.  Still has the sinus   tenderness.  No vaginal bleeding.   REVIEW OF SYSTEMS:   CONSTITUTIONAL:  No fever, chills, weight loss.   ENT: No sore throat, runny nose or other URI symptoms.   RESPIRATORY:  No cough, shortness of breath, or wheezing.   CARDIOVASCULAR:  No chest pain, chest pressure, or palpitations.   GASTROINTESTINAL: The patient complaining of vomiting and diarrhea but no   abdominal pain.   GENITOURINARY:  No dysuria, frequency, or urgency.   INTEGUMENTARY:  No rashes.   PAST MEDICAL HISTORY:  Fibromyalgia, pseudomembranous colitis.    FAMILY HISTORY:  Negative.   SOCIAL HISTORY: Negative.   ALLERGIES:  Demerol.   Last menstrual period 03/27/01.  Has a positive pregnancy via urinalysis x   2.   PHYSICAL EXAMINATION:   GENERAL APPEARANCE:  The patient appears well developed and well nourished.   Appearance and behavior are age and situation appropriate.   HEENT:   Head is normocephalic and atraumatic.  Eyes:  Conjunctivae clear,   lids normal.  Pupils equal, symmetrical, and normally reactive.   Ears/Nose:  Hearing is grossly intact to voice.  Internal and external   examinations of the ears are unremarkable.   Mouth/Throat:  Surfaces of the   pharynx, palate, and tongue are pink, moist, and without lesions.   NECK:  Supple, nontender, symmetrical, no masses or JVD, trachea midline,   thyroid not enlarged, nodular, or tender.   LYMPHATIC:  No cervical or submandibular lymphadenopathy palpated.   RESPIRATORY:  Clear and equal breath sounds.  No respiratory distress,  tachypnea, or accessory muscle use.  Chest percussion normal.   CARDIOVASCULAR:  Heart regular, without murmurs, gallops, rubs, or thrills.   PMI not displaced. Carotid pulses 2+ and equal bilaterally without bruits.   DP pulses 2+ and equal bilaterally.   GI:  Abdomen soft, nontender, without complaint of pain to palpation.  No   hepatomegaly or splenomegaly.   SKIN:  Warm and dry without rashes.   NEUROLOGIC:  Cranial nerves, deep tendon reflexes, strength, and light   touch sensation are unremarkable.   IMPRESSION/MANAGEMENT PLAN:   The patient will have a laboratory   urinalysis, i-STAT, normal saline wide open, will be given Reglan 10 mg IV   may repeat x 1, orthostatics after the first liter, sips of liquids and   Tylenol 975 p.o.   CONTINUATION BY DR. FRUMKIN:   H&amp;P above by Mr. [place].   My history and examination:  Basically persistent diarrhea after Augmentin   and persistent vomiting after some IV fluid therapy. Diarrhea was actually    gone since this morning.  Admits to lightheadedness with standing, no   significant abdominal pain, no bleeding or discharge. Past history   remarkable for fibromyalgia and she has recently stopped taking those   medications (Neurontin and Elavil).   MY EXAMINATION:   GENERAL DESCRIPTION:   A healthy-appearing 31 year old woman in minimal   distress.   HEENT:  Pupils equal and reactive, extraocular movements normal.  Nose and   throat unremarkable.   NECK:  Supple, no significant cervical lymphadenopathy.  No JVD.   LUNGS:  Clear and equal breath sounds.  No respiratory distress or   tachypnea.   HEART:  Regular without significant murmurs, gallops, or rubs.   BACK:  No CVAT.   EXTREMITIES:   Calves soft and nontender.  No peripheral edema.  Pulses   satisfactory.   ABDOMEN:  Soft, nontender, without complaint of pain to palpation.   INITIAL ASSESSMENT AND MANAGEMENT PLAN:  Recurrent vomiting, volume   depletion most likely with intrauterine pregnancy we want to maximize her   volume status.   COURSE IN THE EMERGENCY DEPARTMENT:   The patient was given a liter of IV   fluids, 10 mg of Reglan and sips of liquids. At the completion of that   therapy she was able to take by mouth and was not orthostatic.   DIAGNOSTIC STUDIES:   An i-STAT was unremarkable with BUN 7, glucose 90,   hematocrit 46, electrolytes normal, CO2 is 26.  Urinalysis showed some   ketones and a specific gravity of 1.020 with moderate leukocyte esterase   which was largely epithelial cells (cultured).  Has 30-50 epithelial cells,   5-9 white cells, 3+ bacteria (no urinary symptoms).   SUBSEQUENT COURSE IN THE EMERGENCY DEPARTMENT:    For the ketonuria the   patient was given a second liter of fluids and felt well until she stood up   and tried to walk to the bathroom when she had some diarrhea.  She has not   had any diarrhea otherwise today.  At disposition she still had no   abdominal pain, no nausea and was taking by mouth.    Repeat abdominal exam was entirely nontender.   FINAL DIAGNOSES:   1. Vomiting and diarrhea.   2. Early pregnancy.   3. Volume depletion.   DISPOSITION/PLAN: Discussed this may be gastroenteritis versus vomiting   from pregnancy which she had with her previous pregnancy. She is given   prescriptions  for Phenergan suppositories 25 mg #3 with 1 refill to take   that or to take Reglan 10 mg p.o. #15 with 1 refill for her nausea. She is   advised Imodium and bedrest for her diarrhea.  To return specifically for   increase in pain, inability to take by mouth or orthostasis, advised to   call her gynecologist in the morning.   Electronically Signed By:   Shanna Cisco, M.D. 05/08/2001 17:14   ____________________________   Shanna Cisco, M.D.   dh/jdm  D:  05/07/2001  T:  05/07/2001 10:28 P   [id number]   Hilaria Ota, PA-C

## 2001-05-09 NOTE — ED Provider Notes (Signed)
[facility]                      EMERGENCY DEPARTMENT TREATMENT REPORT   ADMISSION   NAME:  [name] [name], [name] [name]   MR #:         BILLING #: [id number]          DOS: 05/09/2001   TIME: 2:20 P   40-03-62   cc:    Angus Palms, D.O.          Karl Pock, M.D.   Primary Physician:   CHIEF COMPLAINT: Still vomiting.   HISTORY OF PRESENT ILLNESS:  The patient is a 31 year old woman.  She had   developed an infection, was treated with Augmentin at the very end of   March.  She showed up at [facility] on 05/06/01 with diarrhea.  She has   had a home pregnancy test that was positive at that time and had ketones in   her urine, was given a liter of "D5W" with a repeat urinalysis without   ketones.  She called [facility] on 05/07/01 for continued vomiting and   diarrhea and was referred here.  She saw me on that date.  At that time her   diarrhea had lessened (twice that day including once just before she left   the emergency department) with some persistent vomiting after her visit the   previous day.  She had a benign physical examination except for some   clinical dehydration  She had small ketones in her urine and a specific   gravity of 1.020.  A urine culture just showed some staphylococcus, feces   at 10,000 units and some lactobacillus at 50,000 units.  She had had a lot   of epithelial cells.  She has no urinary symptoms.  The patient returns   after her discharge the evening of the third with, she says, continued   vomiting.  She vomited frequently, has not been able to keep any liquids   down and the diarrhea which was gone the day after her emergency department   evaluation returned yesterday.  No blood or dark material at either end.   The patient admits to being lightheaded with standing again.   She was seen again in [facility] where they spoke to Dr. [name].  A note   from him is for [place] to evaluate.  The impression the patient got was that   she was to be admitted.    At [facility] she had a weight of 132 pounds.  She was 131 pounds on   05/06/01 there.  Urinalysis with large ketones, glucose was negative,   specific gravity of 1.025, trace blood and leukocyte esterase.  She was   sent here from [facility] after their phone call to Dr. [name].   PAST MEDICAL HISTORY:  Fibromyalgia, pseudomembranous colitis.   FAMILY HISTORY:  Negative, on one else ill.   SOCIAL HISTORY: Nonsmoker.   ALLERGIES:  Demerol.   MEDICATIONS: Reglan p.o. and Phenergan suppositories which I discharged her   with (states that they are not helping).   PHYSICAL EXAMINATION:   VITAL SIGNS: Blood pressure 115/80, pulse 90, respiration 20, temperature   98.79F.   GENERAL:   This is a thin 31 year old woman in minimal distress.  She is   known to me from her evaluation on April 3.   NECK:  Supple, Kernig's and Brudzinski's signs were absent.   LUNGS:  Clear  and equal breath sounds, no respiratory distress or   tachypnea.   HEART:  Regular, without significant murmurs, gallops, or rubs.  ABDOMEN:   Soft, nontender, without complaint of pain to palpation.  Mucous membranes   are questionably dry.   EXTREMITIES:   Calves soft and nontender.  No peripheral edema.  Pulses   satisfactory.   NEUROLOGICAL:  Grossly intact.   FINAL DIAGNOSES:   1. Persistent and progressive vomiting and diarrhea: vomiting in pregnancy      plus/minus gastroenteritis.   2. Recurrent volume depletion with ketonuria.   3. Early pregnancy.   DISPOSITION:  Discussed with Dr. [name] who was aware that the patient would be   coming over here.  He had apparently talked to Dr. Lavone Neri and asked him   to see her in consultation.  Orders written by me as requested by him.   Electronically Signed By:   Shanna Cisco, M.D. 05/09/2001 23:37   ____________________________   Shanna Cisco, M.D.   rew  D:  05/09/2001  T:  05/09/2001  2:22 P   [id number]

## 2001-05-09 NOTE — Consults (Signed)
[facility]                               CONSULTATION REPORT                     CONSULTANT:  Larene Beach, M.D.   NAME:       [name] [name]   BILLING #:  [id number]             DATE OF CONSULT:     05/09/2001   MR #:       [id number]              ADM DATE:            05/09/2001   SS #        [id number]           PT. LOCATION:        [id number]   ATTENDING:  Karl Pock, M.D.   cc:    Angus Palms, D.O.          Larene Beach, M.D.          Karl Pock, M.D.   HISTORY OF PRESENT ILLNESS:   I was asked to see this 31 year old white   female who had Augmentin on Saturday for a sinus infection and began Sunday   with diarrhea and [facility] with diarrhea and   received IV fluid with D-5W the next day.  The diarrhea did decrease   somewhat but she has had increased vomiting and ketonuria.  She presents   back to the emergency room.  She has had previous pseudomembranous colitis   and has had ampicillin treatment within the past 4 weeks.  She is 5 to [redacted]   weeks pregnant with her 3rd pregnancy and she has 1 living child.   PAST MEDICAL HISTORY:   Pseudomembranous colitis, fibromyalgia.   ALLERGIES:   Demerol   PAST SURGICAL HISTORY:   Sinus operation and left hernia repair.  Mole   removal.  She has had a lumpectomy for benign breasts.   FAMILY HISTORY:   Negative for hyperemesis gravidarum.   SOCIAL HISTORY:   Nonsmoker, nondrinker.   REVIEW OF SYSTEMS:   Reveals diffuse muscle aches related to fibromyalgia.   She denies trouble with vision, taste or swallowing, shortness of breath,   heartburn, chest pain, burning on urination, muscle aches or arthritis   except as mentioned.  Denies depression.   PHYSICAL EXAMINATION:   VITAL SIGNS:   BP 115/80 right arm supine, pulse 90, respirations 18,   temperature 98.3.   GENERAL:  She is a pale white female with braces on her teeth.   HEENT:  Sclerae clear, conjunctivae pink, EOMs intact.  Tongue is    palpable.   NECK:  Supple, no bruit.   LUNGS:  Clear.   HEART:  S1 and S2 are normal.  No murmurs or gallops.   ABDOMEN:  Soft with bowel sounds nontender.   RECTAL:   Deferred.   EXTREMITIES:  No clubbing, cyanosis, calf tenderness or edema.   PERTINENT LABORATORY DATA:   White count 10,000, hemoglobin 14.7,   hematocrit 44.3, 437,000 platelets.   IMPRESSION:   1.    Diarrhea, rule out pseudomembranous colitis, rule out antibiotic         associated, rule out viral gastroenteritis.   2.  Vomiting, rule out viral, rule out hyperemesis secondary to         sinusitis.  Rule out occult gallbladder disease.   PLAN:   1.    Coca-Cola.   2.    IV fluids.   3.    Nutritional consult.   4.    Stool for clostridium difficile and routine culture.   5.    Yogurt.   6.    Observe.   7.    Ultrasound right upper quadrant.   If stool for clostridium difficile positive, will have to discuss whether   or not vancomycin is viable option in someone in early stages of   pregnancy.   Electronically Signed By:   Larene Beach, M.D. 05/14/2001 15:41   _________________________________   Larene Beach, M.D.   [email or web address]  D:  05/09/2001  T:  05/10/2001  1:10 P   [id number]

## 2001-05-09 NOTE — Progress Notes (Signed)
[facility] CLINICAL SUMMARY   NAME:  [name] [name], [name] [name]                          SS#:        [id number]   DOB:    [birth year]                               AGE:        31   SEX:    F                                     [facility]   MR#:    [id number]                                 DATE:       05/09/2001   DICTATING PHYSICIAN:  Angus Palms, D.O.   REFERRING PHYS:   The patient in for evaluation of ongoing nausea, vomiting, and weakness,   and she has noted that she is even vomiting water today.  Diarrhea is very   little, as she has not been able to eat.  She is pregnant, about 5-6 weeks,   and is seen by Dr. [name].  She notes over this past week since this all   started, after the once dose of Augmentin, symptoms have never gone away,   but the IV fluids given x 2 on 2 different days did help temporarily.   She notes emergency room did blood test and a stool test when she was there   two and a half days ago.  She has not seen any blood.  Husband with her   today.   ALLERGIES:  Demerol.   MEDICATIONS:  None.   PHYSICAL EXAMINATION:   VITAL SIGNS:  Blood pressure 102/60, temperature 98.1, pulse 84,   respiratory 12, height 5' tall, weight 132 pounds.   Last menstrual period 03/27/01.   Exam: Weight is down 7 pounds total, but no more since last 3 days.  Exam   shows soreness of lower abdomen.  Tongue moist.  No rash.   ASSESSMENT:  Dehydration, malnourishment, and weakness.   PLAN:  Urine test done shows large ketones.   Discussed with Dr. [name] and Dr. Lavone Neri.  Note Dr. Lavone Neri admitted her 9   months ago when she had a colitis problem, but that had all resolved.   The patient sent to emergency room where Dr. [name] will meet patient and start   evaluation for further IV fluids, treatment of the intestinal problems, and   will also consult Dr. Lavone Neri, who I spoke with today too.   Electronically Signed By:   Angus Palms, D.O. 11/10/2001 11:15    _____________________________________________   Angus Palms, D.O.   gm  D: 05/09/2001  T: 05/10/2001 12:02 A    [id number]

## 2001-05-09 NOTE — Discharge Summary (Signed)
[facility]                                DISCHARGE SUMMARY   [name] [name], [name] [name]   E:   MR  [id number]                          ADM DATE:      05/09/2001   #:   [name] [name]  [id number]                       DIS DATE:      05/12/2001   #   Karl Pock, M.D.   cc:    Karl Pock, M.D.   FINAL DIAGNOSIS:   Nausea and vomiting with dehydration as well as diarrhea, probable   antibiotic induced gastrointestinal upset.   This is a 31 year old, white female, gravida 3, para 1, AB 1, admitted on   May 09, 2001, because of nausea, vomiting, diarrhea and weight loss of 7   pounds.  The patient is also pregnant.  LMP   March 27, 2001.  She was   previously treated by Dr. Lavone Neri for colitis back in July of 2002.   Because of that, Dr. Lavone Neri was also consulted.   Treatment consisted of IV therapy of Coke Cola, yogurt.  The patient's   symptoms gradually subsided.  First the diarrhea and then the vomiting, and   then finally the nausea.  The patient is now discharged to be followed up   in the office for the [facility] checkup.   ________________________________   Karl Pock, M.D.   le  D:  05/12/2001  T:  05/13/2001  6:29 A   [id number]

## 2001-05-09 NOTE — H&P (Signed)
[facility]                              HISTORY AND PHYSICAL   NAME:    [name] [name], [name] [name]   MR #:    [id number]                    ADM DATE:        05/09/2001   BILLING  [id number]                   PT. LOCATION     [id number]   #:   SS #     [id number]   Karl Pock, M.D.   cc:    Karl Pock, M.D.   CHIEF COMPLAINT:  Nausea, vomiting with dehydration and diarrhea.   PRESENT HISTORY:  This is a 31 year old white female gravida 3, para 1, AB   1, admitted because of nausea, vomiting, diarrhea and weight loss of 7   pounds.  The patient is also pregnant, last menstrual period March 27, 2001, about 5 to [redacted] weeks pregnant.  She was seen in [facility] on April   2nd, 2003 and received intravenous IV of D5W.  The diarrhea got better but   the nausea and vomiting increased and resulted in ketonuria.   ALLERGIES:  Demerol.   SOCIAL AND PERSONAL HISTORY:  Non smoker and non alcohol.   FAMILY HISTORY:  Benign.   PAST SURGICAL HISTORY:  Sinus surgery.  Left inguinal hernia repair.   Breast biopsies.   PAST MEDICAL HISTORY:  Fibromyalgia and pseudomembranous colitis.   GENERAL PHYSICAL EXAMINATION:  Afebrile, blood pressure 150/80.   GENERAL:  Well developed, nourished female.   SKIN:  Unremarkable.   LYMPH NODES:  Negative.   HEENT:  Unremarkable.   NECK:  Supple.   CHEST:  Equal expansion.   LUNGS:  Clear to P &amp; A.   HEART:  Regular sinus rhythm.  No murmurs.   BREASTS:  Negative for masses and tenderness.   GENITOURINARY:  No cva tenderness.   ABDOMEN:  Soft, non-tender.  Liver and spleen not palpable.  Bowel sounds   normal.   PELVIC:   Soft closed cervix, no vaginal bleeding.   EXTREMITIES AND SPINE:  Within normal limits.   IMPRESSION:  Diarrhea with nausea and vomiting, previous history associated   membranous colitis.   ____________________________   Karl Pock, M.D.   Xaver.Mink  D:  06/01/2001  T:  06/01/2001  6:54 A   [id number]

## 2001-12-15 NOTE — Op Note (Signed)
[facility]                                OPERATION REPORT   [name], [name]   E:   MR  [id number]                         DATE:            12/15/2001   #:   [name]  [id number]                      PT. LOCATION:    [id number]   #   Karl Pock, M.D.   cc:    Karl Pock, M.D.   PREOPERATIVE DIAGNOSES:   1. Gravida 3, para 1, 37 weeks and 4 days.   2. Previous history of difficult vacuum delivery.   3. Spontaneous onset of labor.   POSTOPERATIVE DIAGNOSES:   Same   OPERATION PERFORMED:   Primary cesarean section   SURGEON:   Maude Leriche, M.D.   ASSISTANT:   Mr. Jetta Lout   ANESTHESIA:   Spinal by Dr. Vickie Epley and Ms. Edwards   DESCRIPTION OF PROCEDURE:  Under satisfactory general anesthesia the   patient was prepped and draped in supine position with left lateral tilt.   Pfannenstiel incision made on the lower anterior abdomen. The anterior   abdominal wall was entered in layers, followed by the entrance of the   peritoneal cavity.  Bladder flap created transversely.  Transverse incision   was made on the lower anterior uterine wall. Uterine cavity entered.   Amniotic membrane ruptured.  Clear fluid was obtained.  A viable female   infant was delivered through LOT position.  The baby was suctioned,   umbilical cord doubly clamped and cut.  The baby was given to house   pediatrician for further care and management. The placenta was delivered   manually.  The uterine cavity was closed in one layer with continuous   interlocking chromic #1.  Visceral peritonealization accomplished with #0   chromic continuously. Peritoneal cavity irrigated with lactated Ringer's   solution.  After sponge count and needle count were correct, the peritoneal   cavity was closed with #1 chromic continuously, fascia re-approximated with   #0 Vicryl continuously in 2 segments and subcutaneous fat interruptedly   re-approximated with #00 plain.  The skin was re-approximated with metallic   staples.  The  patient tolerated the procedure well.  Estimated blood loss   was 300 cc. As stated, the baby is a viable female infant.   _________________________________   Karl Pock, M.D.   hp  D:  12/15/2001  T:  12/16/2001  2:53 P   [id number]

## 2001-12-15 NOTE — Op Note (Signed)
[facility]                                OPERATION REPORT   [name] [name], [name] [name]   E:   MR  [id number]                         DATE:            12/15/2001   #:   [name] [name]  [id number]                      PT. LOCATION:    [id number]   #   Karl Pock, M.D.   cc:    Karl Pock, M.D.   PREOPERATIVE DIAGNOSES:   1. Gravida 3, para 1, 37 weeks and 4 days.   2. Previous history of difficult vacuum delivery.   3. Spontaneous onset of labor.   POSTOPERATIVE DIAGNOSES:   Same   OPERATION PERFORMED:   Primary cesarean section   SURGEON:   Maude Leriche, M.D.   ASSISTANT:   Mr. Jetta Lout   ANESTHESIA:   Spinal by Dr. Vickie Epley and Ms. Edwards   DESCRIPTION OF PROCEDURE:  Under satisfactory general anesthesia the   patient was prepped and draped in supine position with left lateral tilt.   Pfannenstiel incision made on the lower anterior abdomen. The anterior   abdominal wall was entered in layers, followed by the entrance of the   peritoneal cavity.  Bladder flap created transversely.  Transverse incision   was made on the lower anterior uterine wall. Uterine cavity entered.   Amniotic membrane ruptured.  Clear fluid was obtained.  A viable female   infant was delivered through LOT position.  The baby was suctioned,   umbilical cord doubly clamped and cut.  The baby was given to house   pediatrician for further care and management. The placenta was delivered   manually.  The uterine cavity was closed in one layer with continuous   interlocking chromic #1.  Visceral peritonealization accomplished with #0   chromic continuously. Peritoneal cavity irrigated with lactated Ringer's   solution.  After sponge count and needle count were correct, the peritoneal   cavity was closed with #1 chromic continuously, fascia re-approximated with   #0 Vicryl continuously in 2 segments and subcutaneous fat interruptedly   re-approximated with #00 plain.  The skin was re-approximated with metallic    staples.  The patient tolerated the procedure well.  Estimated blood loss   was 300 cc. As stated, the baby is a viable female infant.   _________________________________   Karl Pock, M.D.   hp  D:  12/15/2001  T:  12/16/2001  2:53 P   [id number]

## 2001-12-17 NOTE — Discharge Summary (Signed)
[facility]                                DISCHARGE SUMMARY   [name] [name], [name] [name]   E:   MR  [id number]                          ADM DATE:      12/15/2001   #:   [name] [name]  [id number]                       DIS DATE:      12/17/2001   #   Karl Pock, M.D.   cc:    Karl Pock, M.D.   FINAL DIAGNOSIS:  Gravida 3, para 2, 37 weeks and 4 days spontaneous onset   of labor, previous history of difficult vacuum.   PROCEDURE PERFORMED:  Primary cesarean section.   This is a 31 year old white female gravida 3, para 2 at 37 weeks and 4 days   with spontaneous onset of labor, previous history of difficult vacuum.   Primary cesarean section was performed under spinal anesthesia which she   tolerated the procedure well and left the operating room with satisfactory   condition.  The delivery was a viable female infant.  Postoperative course is   benign and discharge today to be followed up in the office in 6 weeks.  The   patient at the time of discharge on regular diet, up and about, not   complaining of any unusual pain.  Postoperative instructions given.  Take   home medication, [id number]-N-[id number] #30.   ________________________________   Karl Pock, M.D.   [email or web address]  D:  12/17/2001  T:  12/17/2001  3:35 P   [id number]

## 2004-06-13 NOTE — Procedures (Signed)
[facility]                         PERIPHERAL VASCULAR LABORATORY   NAME:     [name] [name], [name] [name]                      DATE:   06/13/2004   AGE/DOB: 34  /  1970/09/15                      ROOM #: OP   SEX:      F                                 MR #:    [id number]   CPT CODE: 16109                            SS#     [id number]   REFERRING PHYSICIAN:   Vivianne Master, M.D.   CHIEF COMPLAINT/SYMPTOMS:  Headaches; right eye pain   EXAMINATION:  CEREBROVASCULAR EXAMINATION   INTERPRETATION:   Duplex velocity profile reveals peak systolic velocity of   122 in the right internal and 128 in the left internal.  B-mode ultrasound   reveals minimal plaquing in both carotid bifurcations.  Vertebrals are open   with normally directed flow.   IMPRESSION:      1. Less than 20% diameter reduction in both internal carotid arteries.      2. Vertebrals are open with normally directed flow.   Electronically Signed By:   Ponciano Ort, M.D. 06/22/2004 09:35   ______________________________________________   Ponciano Ort, M.D.   lo  D: 06/13/2004  T: 06/13/2004  5:59 P  [id number] dj

## 2004-06-13 NOTE — Procedures (Signed)
[facility]                         PERIPHERAL VASCULAR LABORATORY   NAME:     [name], [name]                      DATE:   06/13/2004   AGE/DOB: 34  /  06/10/1970                      ROOM #: OP   SEX:      F                                 MR #:    [id number]   CPT CODE: 93880                            SS#     [id number]   REFERRING PHYSICIAN:   HOWARD R. FELDMAN, M.D.   CHIEF COMPLAINT/SYMPTOMS:  Headaches; right eye pain   EXAMINATION:  CEREBROVASCULAR EXAMINATION   INTERPRETATION:   Duplex velocity profile reveals peak systolic velocity of   122 in the right internal and 128 in the left internal.  B-mode ultrasound   reveals minimal plaquing in both carotid bifurcations.  Vertebrals are open   with normally directed flow.   IMPRESSION:      1. Less than 20% diameter reduction in both internal carotid arteries.      2. Vertebrals are open with normally directed flow.   Electronically Signed By:   ROBERT G. GAYLE, M.D. 06/22/2004 09:35   ______________________________________________   ROBERT G. GAYLE, M.D.   lo  D: 06/13/2004  T: 06/13/2004  5:59 P  [id number] dj

## 2004-08-14 NOTE — Op Note (Signed)
[facility]                                OPERATION REPORT                         SURGEON:  Ann Held, M.D.   [facility] [place], [place]   E:   MR  [id number]                         DATE:            08/14/2004   #:   [name] [name]  [id number]                      PT. LOCATION:    OR  OR04   #   Ann Held, M.D.   cc:    Ann Held, M.D.   PREOPERATIVE DIAGNOSIS:   Mass in left chest wall, left upper breast   POSTOPERATIVE DIAGNOSIS:   Mass, left chest wall, clinically lipoma   OPERATIVE PROCEDURE:   Excision of subcutaneous mass, left chest wall   SURGEON:   Dr. Marcelle Overlie   ASSISTANT:   2A   ANESTHESIA:   LMA, 1% lidocaine with epinephrine local   COMPLICATIONS:   None   INDICATIONS FOR OPERATION:   The patient is 34 year old female with a persistent mass located either   within the superior aspect of the left breast or within the subcutaneous   tissue of the left chest wall just superior to the breast.  This mass did   not show up on mammogram.  It had remained persistently palpable.  It was   felt that excision to rule out a breast mass was indicated.   DESCRIPTION OF PROCEDURE:  The patient was placed on the operating table in   the supine position.  After induction of LMA anesthesia a small transverse   incision was made over the mass.  This was carried down into the   subcutaneous tissues, at which time a typical-appearing lipoma with a   definite capsule surrounding it was dissected free from the surrounding   fatty tissue.  The mass was passed off the operative field.  Further   palpation revealed no abnormality in the chest wall.  Following this, the   wound was irrigated.  Hemostasis was assured with the electrocautery unit.   The skin was then closed with a running subcuticular suture of 4-0 Monocryl   and further reinforced with benzoin and Steri-Strips.  Needle and sponge   counts were correct.  The patient tolerated the procedure well and was   returned to the recovery room in satisfactory  condition.   Electronically Signed By:   Ann Held, M.D. 08/24/2004 15:19   _________________________________   Ann Held, M.D.   sc  D:  08/14/2004  T:  08/15/2004  3:54 P   [id number]

## 2004-08-14 NOTE — Op Note (Signed)
[facility]                                OPERATION REPORT                         SURGEON:  Ann Held, M.D.   [facility] [place], [place]   E:   MR  [id number]                         DATE:            08/14/2004   #:   [name]  [id number]                      PT. LOCATION:    OR  OR04   #   Ann Held, M.D.   cc:    Ann Held, M.D.   PREOPERATIVE DIAGNOSIS:   Mass in left chest wall, left upper breast   POSTOPERATIVE DIAGNOSIS:   Mass, left chest wall, clinically lipoma   OPERATIVE PROCEDURE:   Excision of subcutaneous mass, left chest wall   SURGEON:   Dr. Marcelle Overlie   ASSISTANT:   2A   ANESTHESIA:   LMA, 1% lidocaine with epinephrine local   COMPLICATIONS:   None   INDICATIONS FOR OPERATION:   The patient is 34 year old female with a persistent mass located either   within the superior aspect of the left breast or within the subcutaneous   tissue of the left chest wall just superior to the breast.  This mass did   not show up on mammogram.  It had remained persistently palpable.  It was   felt that excision to rule out a breast mass was indicated.   DESCRIPTION OF PROCEDURE:  The patient was placed on the operating table in   the supine position.  After induction of LMA anesthesia a small transverse   incision was made over the mass.  This was carried down into the   subcutaneous tissues, at which time a typical-appearing lipoma with a   definite capsule surrounding it was dissected free from the surrounding   fatty tissue.  The mass was passed off the operative field.  Further   palpation revealed no abnormality in the chest wall.  Following this, the   wound was irrigated.  Hemostasis was assured with the electrocautery unit.   The skin was then closed with a running subcuticular suture of 4-0 Monocryl   and further reinforced with benzoin and Steri-Strips.  Needle and sponge   counts were correct.  The patient tolerated the procedure well and was    returned to the recovery room in satisfactory condition.   Electronically Signed By:   Ann Held, M.D. 08/24/2004 15:19   _________________________________   Ann Held, M.D.   sc  D:  08/14/2004  T:  08/15/2004  3:54 P   [id number]

## 2005-01-17 NOTE — ED Provider Notes (Signed)
[facility]                      EMERGENCY DEPARTMENT TREATMENT REPORT   NAME:  [name] [name]                       PT. LOCATION:     ER  ER21   MR #:         BILLING #: [id number]          DOS: 01/17/2005   TIME: 1:19 P   [id number]   cc:    Vivianne Master, M.D.   Primary Physician:   CHIEF COMPLAINT:   Dizziness.   HISTORY OF PRESENT ILLNESS:   The patient is a 34 year old woman who has   history of chronic headaches, migraines under the care of Dr. Josefa Half, who   comes in complaining of new onset of dizziness, feeling like she is going   to pass out.  She said that has been going on for the past couple of days.   She sees Dr. Josefa Half has MRI of the brain is currently trying to get   insurance approval for MRV of the brain as well.  She says the headache has   been unchanged.  They are everyday constantly for the past 2 weeks.  She   said the headache severity does not seem to correlate with the dizziness   episodes that she believes are unrelated.  Prior dizziness she says she   just feels like her whole body is heavy, that she is going to pass out.  It   is worse when she sits up, when she is about doing things.  Has not had any   palpitations, chest pain.  Saw Dr. Gibson Ramp for the dizziness yesterday,   when it persisted today he told her to come to the emergency department.   REVIEW OF SYSTEMS:   CONSTITUTIONAL:  No fever, chills, weight loss.   EYES: No visual symptoms.   ENT: No sore throat, runny nose or other URI symptoms.   RESPIRATORY:  No cough, shortness of breath, or wheezing.   CARDIOVASCULAR:  No chest pain, chest pressure, or palpitations.   GASTROINTESTINAL:  No vomiting, diarrhea, or abdominal pain.   GENITOURINARY:  No dysuria, frequency, or urgency.   MUSCULOSKELETAL:  No joint pain or swelling.   INTEGUMENTARY:  No rashes.   NEUROLOGICAL:  As described in HPI.   PAST MEDICAL HISTORY:  Fibromyalgia, chronic headaches.  Under the care of   Dr. Josefa Half.    PAST SURGICAL HISTORY:  C-section, hernia repair, polyp removal, jaw   surgery.   SOCIAL HISTORY: Denies tobacco or illicit drug use. Consumes alcohol   socially.   FAMILY HISTORY:  Mother with coronary disease, bypass x 2.   MEDICATIONS:  BuSpar, Flexeril, Singulair, verapamil, Rhinocort, Astelin.   ALLERGIES:  Demerol-causes stomach sensitivity.   PHYSICAL EXAMINATION:   VITAL SIGNS:  Temperature 97.7, blood pressure 134/86, pulse 74,   respirations 16, pulse oximetry 100%.   GENERAL:   Well-developed woman.   HEENT:   Eyes:  Conjunctivae clear, lids normal.  Pupils equal,   symmetrical, and normally reactive.   Mouth/Throat:  Surfaces of the   pharynx, palate, and tongue are pink, moist, and without lesions.   LYMPHATICS:  No cervical or submandibular lymphadenopathy palpated.   RESPIRATORY:  Clear and equal breath sounds.  No respiratory distress,   tachypnea, or accessory muscle  use.   CARDIOVASCULAR:  Heart regular, without murmurs, gallops, rubs, or thrills.   PMI not displaced.   GI:  Abdomen soft, nontender, without complaint of pain to palpation.  No   hepatomegaly or splenomegaly.   MUSCULOSKELETAL:   Nails:  No clubbing or deformities.  Nail beds pink with   prompt capillary refill.   SKIN:  Warm and dry without rashes.   NEUROLOGICAL:  Awake, oriented x 3.  Strength 5/5 upper and lower   extremities.  Sensation intact.   BACK:  No cva tenderness.   INITIAL IMPRESSION/MANAGEMENT PLAN:   The patient comes in with complaints   of dizziness.  We are going to check orthostatics, metabolic cardiac   workup.   CONTINUATION BY DR. Raynald Kemp:   COURSE IN THE EMERGENCY DEPARTMENT:  The patient's orthostatics showed that   her blood pressure and pulse remained stable and she did not have dizziness   with standing.  She did receive a liter of fluids  Her diagnostic labs have   returned.   DIAGNOSTIC STUDIES:  EKG showed sinus rhythm, some nonspecific ST  changes,    no signs of acute ischemia.  Cardiac enzymes completely normal.  Troponin 0   despite having her symptoms now for a couple of days.  Electrolytes are   within normal limits.  CBC showed a normal white count, no differential   shift.   PLAN:  I spoke with Dr. Gibson Ramp. The patient symptomatically feels better,   she has been up, she no longer feels dizzy. She received Haldol and   Benadryl and her headache has improved. She feels better and would like to   go home, and I believe given her lack of cardiac risk factors that she   could be followed safely as an outpatient.  Dr. Gibson Ramp is going to set up   an outpatient Holter monitor to recheck to see if she maybe needs   outpatient stress test.   FINAL DIAGNOSIS:  Dizziness evaluation.   DISPOSITION/PLAN: Home.   Electronically Signed By:   Jerilynn Som, M.D. 01/19/2005 00:22   ____________________________   Jerilynn Som, M.D.   dh/jdm  D:  01/17/2005  T:  01/17/2005  2:58 P   [id number]

## 2005-01-25 NOTE — Procedures (Signed)
[facility]                          STRESS ECHOCARDIOGRAM REPORT   NAME:   [name], [name] B                                SS#:   [id number]   DOB:     01/30/1971                                     AGE:        34   SEX:     F                                           ROOM#:     OP   MR#:    [id number]                                       DATE:   01/25/2005   REFERRING PHYS:   H. Feldman                              TAPE/INDEX:   [id number]   PRETEST DATA:   INDICATION:  Syncope   MEDS TAKEN:  --   MEDS HELD:  Flexeril, Welchol, Singulair, Verapamil   TARGET HEART RATE:  184     85%:  156   RISK FACTORS:  Family history; hyperlipidemia   BASELINE ECG:  Normal sinus rhythm; within normal limits   EXERCISE SUPERVISED BY:  Charles C. Ashby, Jr., M.D.   TEST RESULTS:             BRUCE PROTOCOL    STAGE    SPEED (MPH)   GRADE (%)    TIME (MIN:SEC)     HR       BP   Resting                                                  82     115/77      1          1.7           10            3:00         128      94/60      2          2.5           12            3:00         148      98/70      3          3.4           14            1:00           166     ---/--   Recovery                               Immediate        --     ---/--                                             2:00         108     124/72                                             4:00          95     117/74                                             6:00          87     118/65   REASON FOR STOPPING:  Achieved target heart rate; dizziness       TOTAL   EXERCISE TIME:  7:00   ACHIEVED HEART RATE:  166 (90%  max HR)                           EST.   METS:  --   HR RESPONSE:  Tachycardic                                         PEAK RPP:   20,584   BP RESPONSE:  Normal                                              CHEST   PAIN:  None   OBSERVED DYSRHYTHMIAS:  None   ST SEGMENT CHANGES:  None                                 WALL MOTION  ANALYSIS   LV WALL SEGMENT             PRE-EXERCISE             POST-EXERCISE   Basal Anteroseptal             Normal                 Hyperkinesis   Basal Septal                   Normal                 Hyperkinesis   Basal Inferoseptal             Normal                 Hyperkinesis   Basal Posterior                  Normal                 Hyperkinesis   Basal Lateral                  Normal                 Hyperkinesis   Basal Anterior                 Normal                 Hyperkinesis   Mid Anteroseptal               Normal                 Hyperkinesis   Mid Septal                     Normal                 Hyperkinesis   Mid Inferoseptal               Normal                 Hyperkinesis   Mid Posterior                  Normal                 Hyperkinesis   Mid Lateral                    Normal                 Hyperkinesis   Mid Anterior                   Normal                 Hyperkinesis   Apical Septal                  Normal                 Hyperkinesis   Apical Inferior                Normal                 Hyperkinesis   Apical Lateral                 Normal                 Hyperkinesis   Apical Anterior                Normal                 Hyperkinesis   LV  Chamber Size                                        Smaller   ECG INTERPRETATION:  Negative by ECG criteria.   ECHO INTERPRETATION:  Normal wall motion.   OVERALL IMPRESSION:  Negative for ischemia.   ECG AND ECHO INTERPRETATION BY :                                            CHARLES ASHBY, JR, M.D.   ds  D: 01/25/2005  T: 01/27/2005 10:44 P    wks

## 2005-01-25 NOTE — Procedures (Signed)
[facility]                          STRESS ECHOCARDIOGRAM REPORT   NAME:   [name] [name], [name] [name]                                SS#:   [id number]   DOB:     [birth year]                                     AGE:        34   SEX:     F                                           ROOM#:     OP   MR#:    [id number]                                       DATE:   01/25/2005   REFERRING PHYSEbony Hail                              TAPE/INDEX:   [id number]   PRETEST DATA:   INDICATION:  Syncope   MEDS TAKEN:  --   MEDS HELD:  Flexeril, Welchol, Singulair, Verapamil   TARGET HEART RATE:  184     85%:  156   RISK FACTORS:  Family history; hyperlipidemia   BASELINE ECG:  Normal sinus rhythm; within normal limits   EXERCISE SUPERVISED BY:  Zonia Kief. Dian Situ., M.D.   TEST RESULTS:             BRUCE PROTOCOL    STAGE    SPEED (MPH)   GRADE (%)    TIME (MIN:SEC)     HR       BP   Resting                                                  82     115/77      1          1.7           10            3:00         128      94/60      2          2.5           12            3:00         148      98/70      3          3.4           14            1:00  166     ---/--   Recovery                               Immediate        --     ---/--                                             2:00         108     124/72                                             4:00          95     117/74                                             6:00          87     118/65   REASON FOR STOPPING:  Achieved target heart rate; dizziness       TOTAL   EXERCISE TIME:  7:00   ACHIEVED HEART RATE:  166 (90%  max HR)                           EST.   METS:  --   HR RESPONSE:  Tachycardic                                         PEAK RPP:   20,584   BP RESPONSE:  Normal                                              CHEST   PAIN:  None   OBSERVED DYSRHYTHMIAS:  None   ST SEGMENT CHANGES:  None                                 WALL MOTION  ANALYSIS   LV WALL SEGMENT             PRE-EXERCISE             POST-EXERCISE   Basal Anteroseptal             Normal                 Hyperkinesis   Basal Septal                   Normal                 Hyperkinesis   Basal Inferoseptal             Normal                 Hyperkinesis   Basal Posterior  Normal                 Hyperkinesis   Basal Lateral                  Normal                 Hyperkinesis   Basal Anterior                 Normal                 Hyperkinesis   Mid Anteroseptal               Normal                 Hyperkinesis   Mid Septal                     Normal                 Hyperkinesis   Mid Inferoseptal               Normal                 Hyperkinesis   Mid Posterior                  Normal                 Hyperkinesis   Mid Lateral                    Normal                 Hyperkinesis   Mid Anterior                   Normal                 Hyperkinesis   Apical Septal                  Normal                 Hyperkinesis   Apical Inferior                Normal                 Hyperkinesis   Apical Lateral                 Normal                 Hyperkinesis   Apical Anterior                Normal                 Hyperkinesis   LV  Chamber Size                                        Smaller   ECG INTERPRETATION:  Negative by ECG criteria.   ECHO INTERPRETATION:  Normal wall motion.   OVERALL IMPRESSION:  Negative for ischemia.   ECG AND ECHO INTERPRETATION BY :  Donnel Saxon, M.D.   ds  D: 01/25/2005  T: 01/27/2005 10:44 P    wks

## 2005-01-25 NOTE — Procedures (Signed)
[facility]                          STRESS ECHOCARDIOGRAM REPORT   NAME:   [name], [name] B                                SS#:   [id number]   DOB:     04/05/1970                                     AGE:        34   SEX:     F                                           ROOM#:     OP   MR#:    [id number]                                       DATE:   01/25/2005   REFERRING PHYS:   H. Feldman                              TAPE/INDEX:   [id number]   PRETEST DATA:   INDICATION:  Syncope   MEDS TAKEN:  --   MEDS HELD:  Flexeril, Welchol, Singulair, Verapamil   TARGET HEART RATE:  184     85%:  156   RISK FACTORS:  Family history; hyperlipidemia   BASELINE ECG:  Normal sinus rhythm; within normal limits   EXERCISE SUPERVISED BY:  Charles C. Ashby, Jr., M.D.   TEST RESULTS:             BRUCE PROTOCOL    STAGE    SPEED (MPH)   GRADE (%)    TIME (MIN:SEC)     HR       BP   Resting                                                  82     115/77      1          1.7           10            3:00         128      94/60      2          2.5           12            3:00         148      98/70      3          3.4           14            1:00           166     ---/--   Recovery                               Immediate        --     ---/--                                             2:00         108     124/72                                             4:00          95     117/74                                             6:00          87     118/65   REASON FOR STOPPING:  Achieved target heart rate; dizziness       TOTAL   EXERCISE TIME:  7:00   ACHIEVED HEART RATE:  166 (90%  max HR)                           EST.   METS:  --   HR RESPONSE:  Tachycardic                                         PEAK RPP:   20,584   BP RESPONSE:  Normal                                              CHEST   PAIN:  None   OBSERVED DYSRHYTHMIAS:  None   ST SEGMENT CHANGES:  None                                  WALL MOTION ANALYSIS   LV WALL SEGMENT             PRE-EXERCISE             POST-EXERCISE   Basal Anteroseptal             Normal                 Hyperkinesis   Basal Septal                   Normal                 Hyperkinesis   Basal Inferoseptal             Normal                 Hyperkinesis   Basal Posterior                  Normal                 Hyperkinesis   Basal Lateral                  Normal                 Hyperkinesis   Basal Anterior                 Normal                 Hyperkinesis   Mid Anteroseptal               Normal                 Hyperkinesis   Mid Septal                     Normal                 Hyperkinesis   Mid Inferoseptal               Normal                 Hyperkinesis   Mid Posterior                  Normal                 Hyperkinesis   Mid Lateral                    Normal                 Hyperkinesis   Mid Anterior                   Normal                 Hyperkinesis   Apical Septal                  Normal                 Hyperkinesis   Apical Inferior                Normal                 Hyperkinesis   Apical Lateral                 Normal                 Hyperkinesis   Apical Anterior                Normal                 Hyperkinesis   LV  Chamber Size                                        Smaller   ECG INTERPRETATION:  Negative by ECG criteria.   ECHO INTERPRETATION:  Normal wall motion.   OVERALL IMPRESSION:  Negative for ischemia.   ECG AND ECHO INTERPRETATION BY :                                            CHARLES ASHBY, JR, M.D.   ds  D: 01/25/2005  T: 01/27/2005 10:44 P    wks

## 2005-01-25 NOTE — Procedures (Signed)
[facility]                          STRESS ECHOCARDIOGRAM REPORT   NAME:   [name] [name], [name] [name]                                SS#:   [id number]   DOB:     1970/12/12                                     AGE:        34   SEX:     F                                           ROOM#:     OP   MR#:    [id number]                                       DATE:   01/25/2005   REFERRING PHYSEbony Hail                              TAPE/INDEX:   [id number]   PRETEST DATA:   INDICATION:  Syncope   MEDS TAKEN:  --   MEDS HELD:  Flexeril, Welchol, Singulair, Verapamil   TARGET HEART RATE:  184     85%:  156   RISK FACTORS:  Family history; hyperlipidemia   BASELINE ECG:  Normal sinus rhythm; within normal limits   EXERCISE SUPERVISED BY:  Zonia Kief. Dian Situ., M.D.   TEST RESULTS:             BRUCE PROTOCOL    STAGE    SPEED (MPH)   GRADE (%)    TIME (MIN:SEC)     HR       BP   Resting                                                  82     115/77      1          1.7           10            3:00         128      94/60      2          2.5           12            3:00         148      98/70      3          3.4           14            1:00  166     ---/--   Recovery                               Immediate        --     ---/--                                             2:00         108     124/72                                             4:00          95     117/74                                             6:00          87     118/65   REASON FOR STOPPING:  Achieved target heart rate; dizziness       TOTAL   EXERCISE TIME:  7:00   ACHIEVED HEART RATE:  166 (90%  max HR)                           EST.   METS:  --   HR RESPONSE:  Tachycardic                                         PEAK RPP:   20,584   BP RESPONSE:  Normal                                              CHEST   PAIN:  None   OBSERVED DYSRHYTHMIAS:  None   ST SEGMENT CHANGES:  None                                  WALL MOTION ANALYSIS   LV WALL SEGMENT             PRE-EXERCISE             POST-EXERCISE   Basal Anteroseptal             Normal                 Hyperkinesis   Basal Septal                   Normal                 Hyperkinesis   Basal Inferoseptal             Normal                 Hyperkinesis   Basal Posterior  Normal                 Hyperkinesis   Basal Lateral                  Normal                 Hyperkinesis   Basal Anterior                 Normal                 Hyperkinesis   Mid Anteroseptal               Normal                 Hyperkinesis   Mid Septal                     Normal                 Hyperkinesis   Mid Inferoseptal               Normal                 Hyperkinesis   Mid Posterior                  Normal                 Hyperkinesis   Mid Lateral                    Normal                 Hyperkinesis   Mid Anterior                   Normal                 Hyperkinesis   Apical Septal                  Normal                 Hyperkinesis   Apical Inferior                Normal                 Hyperkinesis   Apical Lateral                 Normal                 Hyperkinesis   Apical Anterior                Normal                 Hyperkinesis   LV  Chamber Size                                        Smaller   ECG INTERPRETATION:  Negative by ECG criteria.   ECHO INTERPRETATION:  Normal wall motion.   OVERALL IMPRESSION:  Negative for ischemia.   ECG AND ECHO INTERPRETATION BY :  Donnel Saxon, M.D.   ds  D: 01/25/2005  T: 01/27/2005 10:44 P    wks

## 2005-01-29 NOTE — Procedures (Signed)
[facility]                              CARDIOLOGY DEPARTMENT                     AMBULATORY ECG (HOLTER MONITOR) REPORT   Name: Coombs, Ammara B                      Location: OP         Age: 34   Date: 01/29/2005   Ref Phys: Howard Feldman                     MR#: [id number]   Reason For Study:  Syncope   Procedure:    A dual channel ambulatory ECG was obtained and computer scan                 analysis was performed.  Selected strips were reviewed as                 recorded in real time.   Results and Impression:   INDICATION:  Syncope.   INTERPRETATION:  Normal sinus rhythm is the basic rhythm.  Physiologic   heart rate variation from 61-143 bpm is observed with a mean of 89 bpm.   Symptom diary entries of "nausea, dizziness, etc." occur during sinus   rhythm without ST segment changes.  A single VPC is recorded in the entire   tracing.  This was asymptomatic.   __________________________________________________   STEVEN R. JONES, M.D.   bes  D: 01/31/2005  T: 02/03/2005  3:27 P    [id number]

## 2005-01-31 NOTE — Procedures (Signed)
 [facility]                              CARDIOLOGY DEPARTMENT                     AMBULATORY ECG (HOLTER MONITOR) REPORT   Name: [name] [name], [name] [name]                      Location: OP         Age: 34   Date: 01/29/2005   Ref Phys: Kayla Sella                     MR#: [id number]   Reason For Study:  Syncope   Procedure:    A dual channel ambulatory ECG was obtained and computer scan                 analysis was performed.  Selected strips were reviewed as                 recorded in real time.   Results and Impression:   INDICATION:  Syncope.   INTERPRETATION:  Normal sinus rhythm is the basic rhythm.  Physiologic   heart rate variation from 61-143 bpm is observed with a mean of 89 bpm.   Symptom diary entries of nausea, dizziness, etc. occur during sinus   rhythm without ST segment changes.  A single VPC is recorded in the entire   tracing.  This was asymptomatic.   __________________________________________________   ELSPETH FABIENE MOLT, M.D.   bes  D: 01/31/2005  T: 02/03/2005  3:27 P    [id number]

## 2005-02-19 NOTE — Procedures (Signed)
[facility]                         PERIPHERAL VASCULAR LABORATORY   NAME:     Apps, Takeisha B                     DATE:   02/19/2005   AGE/DOB: 34  /  08/16/1970                      ROOM #: OP   SEX:      F                                 MR #:    [id number]   CPT CODE: 93880                            SS#     [id number]   REFERRING PHYSICIAN:   HOWARD R. FELDMAN, M.D.   CHIEF COMPLAINT/SYMPTOMS:  ATYPICAL NEUROLOGICAL SYMPTOMS   EXAMINATION:   CEREBROVASCULAR EXAMINATION   INTERPRETATION:   Duplex exam revealed normal velocities and spectra   throughout both carotid bifurcations.  Imaging revealed no significant   stenosis or flow disturbance.  Vertebral flow was antegrade with normal   velocities.   IMPRESSION:   No evidence of significant extracranial carotid or vertebral artery   occlusive disease,   1-49% stenoses bilaterally.   Electronically Signed By:   RASESH M. SHAH, M.D. 02/22/2005 07:57   ______________________________________________   RASESH M. SHAH, M.D.   lo  D: 02/20/2005  T: 02/20/2005  6:44 P  [id number]

## 2005-02-20 NOTE — Procedures (Signed)
[facility]                         PERIPHERAL VASCULAR LABORATORY   NAME:     [name] [name], [name] [name]                     DATE:   02/19/2005   AGE/DOB: 34  /  May 11, 1970                      ROOM #: OP   SEX:      F                                 MR #:    [id number]   CPT CODE: [id number]                            SS#     [id number]   REFERRING PHYSICIAN:   Vivianne Master, M.D.   CHIEF COMPLAINT/SYMPTOMS:  ATYPICAL NEUROLOGICAL SYMPTOMS   EXAMINATION:   CEREBROVASCULAR EXAMINATION   INTERPRETATION:   Duplex exam revealed normal velocities and spectra   throughout both carotid bifurcations.  Imaging revealed no significant   stenosis or flow disturbance.  Vertebral flow was antegrade with normal   velocities.   IMPRESSION:   No evidence of significant extracranial carotid or vertebral artery   occlusive disease,   1-49% stenoses bilaterally.   Electronically Signed By:   Michael Litter, M.D. 02/22/2005 07:57   ______________________________________________   Michael Litter, M.D.   lo  D: 02/20/2005  T: 02/20/2005  6:44 P  [id number]

## 2006-11-22 NOTE — ED Provider Notes (Signed)
[facility]                      EMERGENCY DEPARTMENT TREATMENT REPORT   NAME:  [name] [name]            PT. LOCATION:      ER  ER52          DOB:                                                                         AGE:   MR #:      BILLING #:           DOA:  11/22/2006   DOD:              SEX:  F   [id number]   [id number]   cc:    Vivianne Master, M.D.   Primary Care Physician:  Dr. Thayer Jew   CHIEF COMPLAINT:  Headache.   HISTORY OF PRESENT ILLNESS:  This is a 36 year old female who states she   had an LP done by radiology yesterday and workup for MS.  She states that   since that time she has had pain that goes from her neck down her back.   She states it is worse when she stands and does radiate up into the back of   her head as well.  She denies any fever.  No numbness or weakness in   extremities.  Since arrival here, she reports some lightheadedness and some   tinnitus.  Denies any other complaints.   REVIEW OF SYSTEMS:   CONSTITUTIONAL:  No fever, chills, weight loss.   EYES: No visual symptoms.   ENT:  Positive for tinnitus.   RESPIRATORY:  No cough, shortness of breath, or wheezing.   CARDIOVASCULAR:  No chest pain, chest pressure, or palpitations.   GASTROINTESTINAL:  No vomiting, diarrhea, or abdominal pain.   GENITOURINARY:  No dysuria, frequency, or urgency.   MUSCULOSKELETAL:  No joint pain or swelling.   NEUROLOGIC:  As per HPI.   PAST MEDICAL HISTORY:  Includes fibromyalgia, celiac disease.   SOCIAL HISTORY:  Here with her husband.  Drinks alcohol socially.  Denies   tobacco.   FAMILY HISTORY:  Noncontributory.   MEDICATIONS:  Multiple and reviewed by myself in ibex.   ALLERGIES:  Demerol.   PHYSICAL EXAMINATION:   VITAL SIGNS:  Temperature 97.4, blood pressure 117/77, pulse 78,   respiratory rate of 16.  O2 saturation is 100% on room air.   GENERAL APPEARANCE:  The patient appears well developed and well nourished.    Appearance and behavior are age and situation appropriate.   HEENT:  Eyes:  Conjunctivae clear, lids normal.  Pupils equal, symmetrical,   and normally reactive. Mouth/Throat:  Surfaces of the pharynx, palate, and   tongue are pink, moist, and without lesions.  Tympanic membranes are clear   bilaterally.   NECK:  Supple.   RESPIRATORY:  Clear and equal breath sounds.  No respiratory distress,   tachypnea, or accessory muscle use.   HEART:  Regular rate and rhythm.   ABDOMEN:  Soft and nontender.  There is no tenderness over the spine.  The  patient has full range of motion of her neck.   SKIN:  Warm and dry without rashes.   NEUROLOGIC:  Alert, oriented.  Sensation intact, motor strength equal and   symmetric.  Cranial nerves are intact.   INITIAL ASSESSMENT AND MANAGEMENT PLAN:  This is a 36 year old female with   possibly an atypical spinal headache post LP.  She also complains of this   lightheadedness and tinnitus.  She states that she has had similar symptoms   with anxiety in the past as well.  We will hydrate and treat   symptomatically.   DIAGNOSTIC STUDIES:  CBC is normal except for 71% segmented neutrophils and   17% lymphocytes.  BMP is normal except for chloride of 110 and glucose of   115.   EMERGENCY DEPARTMENT COURSE:  The patient received IV fluids along with   caffeine IV.  She reports not feeling much change and much worse with   standing.  I spoke with anesthesiology who came to see the patient.   Discussed risks and benefits of the above patch.  At this point, the   patient would like to go home and see if she gets better and possibly   return to the anesthesiology department for a blood patch tomorrow if she   is not improved.  She is given the number and contact information for this.   I also spoke with Dr. Gavin Potters, her neurologist.  She agrees to follow up   with this patient.  I reviewed her CSF results, which reveal no evidence of    infection from what was done here yesterday.  I did give her a dose of   Ativan and Dilaudid prior to discharge.  She reports feeling better and   ready to go home.  She agrees to return for any new or worsening symptoms,   numbness or weakness in extremities, increasing headache, or fever   concerns.   CLINICAL IMPRESSION/DIAGNOSES:   1.  Acute cephalgia, likely spinal headache.   2.  Lightheadedness.   3.  Tinnitus.   DISPOSITION:  The patient is to follow up with her neurologist as above.   Prescriptions for Ativan p.r.n. dispense 12 and Fioricet dispense 15.   Electronically Signed By:   Acquanetta Chain, M.D. 11/29/2006 17:45   ____________________________   Acquanetta Chain, M.D.   My signature above authenticates this document and my orders, the final   diagnosis(es), discharge prescription(s) and instructions in the [facility] record.   MW  D:  11/22/2006  T:  11/24/2006  8:25 A   [id number]

## 2006-11-23 NOTE — Op Note (Signed)
[facility]                                OPERATION REPORT                          SURGEON:  Theo Dills, M.D.   [facility] [place], [place]   E:   MR  [id number]                DATE OF SURGERY:                     11/21/2006   #:   [name] [name]  [id number]             PT. LOCATION:                        [facility]   #   Theo Dills, M.D.          DOB: January 15, 1971        AGE:36        SEX:  F   cc:    Theo Dills, M.D.   PREOPERATIVE DIAGNOSIS:   Post dural puncture headache.   POSTOPERATIVE DIAGNOSIS:   Post dural puncture headache.   PROCEDURE PERFORMED:   Epidural blood patch.   SURGEON:   Dr. Theo Dills   ANESTHESIA:   Local infiltration.   JUSTIFICATION FOR THE PROCEDURE:   This 36 year old lady presents today after initial presentation for   nonresolving, nonremitting headache for which she had undergone the initial   headache workup to include a lumbar puncture. The patient developed a   decided positional headache reminiscent of a dural puncture headache for   which she sought medical attention 2 evenings ago. The patient was   evaluated and found to have equivocal symptoms and was urged to make the   decision to try and wait and see if the headache would resolve naturally as   it often times does. The patient was carefully instructed to follow up if   her headache did not remit and if she developed any other significant   symptoms, and she concurred. The patient was also given clear access to the   anesthesia services to follow up with if her headache did develop a more   positional character and did not remit with the assumption that she would   have an epidural blood patch at that time should she decide to return to   the hospital for two purposes: 1) diagnostic; secondary intent is for   therapeutic benefit. After careful consideration of the risks and benefits   explanation to the patient, she enthusiastically wishes to proceed and in   view of the clear positional nature of this headache and its  nonremitting   nature, I felt that this is a good therapeutic option at this time.   DESCRIPTION OF PROCEDURE:   Informed consent was obtained. The patient was placed sitting and prepared   with a Betadine skin wash and a 1% Xylocaine skin wheal in the skin and   subcutaneous overlying the posterior spinous processes of L3 and L4. An   18-gauge Tuohy needle was then introduced through the skin wheal and   advanced to the midline utilizing the loss of resistance to air technique   until the epidural space was quickly and positively identified. My  assistant drew off 20 mL of sterile autologous blood from her right   antecubital space and transferred the blood to me sterilely at which time   the patient was injected with 18 mL total volume of her autologous blood   under sterile fashion without untoward sequelae. The needle was withdrawn,   sterile dressing was placed, and the patient was then allowed to return to   the supine position in stable condition.   The patient was then evaluated in approximately 20 minutes with a 20%,   20-degree head up position, and she noted no return of her headache. The   patient was then advanced to a 45-degree head up position for several   minutes and reported a slight return of her headache but a definite   improvement over what it had been. The patient was once again returned to   the supine position and urged to relax until such time as she felt that she   could put her clothes on and get ready to go home. Further recovery in the   postoperative area was uneventful, and the patient was ready for discharge   to home within the hour. The patient is urged to force fluids and remain at   relative bed rest on light duty for approximately 24-48 hours and to follow   up through the emergency room should she have any significant developments   and if her headache returns to once again contact [facility] to   see if she needs another blood patch. The patient understands her    instructions for discharge and agrees to follow up if she develops any   further problems.   Electronically Signed By:   Theo Dills, M.D. 12/22/2006 12:13   _________________________________   Theo Dills, M.D.   PS1  D:  11/23/2006  T:  11/24/2006  8:03 A   [id number]

## 2006-11-23 NOTE — Op Note (Signed)
[facility]                                OPERATION REPORT                          SURGEON:  Theo Dills, M.D.   [facility] [place], [place]   E:   MR  [id number]                DATE OF SURGERY:                     11/21/2006   #:   [name] [name]  [id number]             PT. LOCATION:                        [facility]   #   Theo Dills, M.D.          DOB: [birth year]        AGE:36        SEX:  F   cc:    Theo Dills, M.D.   PREOPERATIVE DIAGNOSIS:   Post dural puncture headache.   POSTOPERATIVE DIAGNOSIS:   Post dural puncture headache.   PROCEDURE PERFORMED:   Epidural blood patch.   SURGEON:   Dr. Theo Dills   ANESTHESIA:   Local infiltration.   JUSTIFICATION FOR THE PROCEDURE:   This 36 year old lady presents today after initial presentation for   nonresolving, nonremitting headache for which she had undergone the initial   headache workup to include a lumbar puncture. The patient developed a   decided positional headache reminiscent of a dural puncture headache for   which she sought medical attention 2 evenings ago. The patient was   evaluated and found to have equivocal symptoms and was urged to make the   decision to try and wait and see if the headache would resolve naturally as   it often times does. The patient was carefully instructed to follow up if   her headache did not remit and if she developed any other significant   symptoms, and she concurred. The patient was also given clear access to the   anesthesia services to follow up with if her headache did develop a more   positional character and did not remit with the assumption that she would   have an epidural blood patch at that time should she decide to return to   the hospital for two purposes: 1) diagnostic; secondary intent is for   therapeutic benefit. After careful consideration of the risks and benefits   explanation to the patient, she enthusiastically wishes to proceed and in    view of the clear positional nature of this headache and its nonremitting   nature, I felt that this is a good therapeutic option at this time.   DESCRIPTION OF PROCEDURE:   Informed consent was obtained. The patient was placed sitting and prepared   with a Betadine skin wash and a 1% Xylocaine skin wheal in the skin and   subcutaneous overlying the posterior spinous processes of L3 and L4. An   18-gauge Tuohy needle was then introduced through the skin wheal and   advanced to the midline utilizing the loss of resistance to air technique   until the epidural space was quickly and positively identified. My  assistant drew off 20 mL of sterile autologous blood from her right   antecubital space and transferred the blood to me sterilely at which time   the patient was injected with 18 mL total volume of her autologous blood   under sterile fashion without untoward sequelae. The needle was withdrawn,   sterile dressing was placed, and the patient was then allowed to return to   the supine position in stable condition.   The patient was then evaluated in approximately 20 minutes with a 20%,   20-degree head up position, and she noted no return of her headache. The   patient was then advanced to a 45-degree head up position for several   minutes and reported a slight return of her headache but a definite   improvement over what it had been. The patient was once again returned to   the supine position and urged to relax until such time as she felt that she   could put her clothes on and get ready to go home. Further recovery in the   postoperative area was uneventful, and the patient was ready for discharge   to home within the hour. The patient is urged to force fluids and remain at   relative bed rest on light duty for approximately 24-48 hours and to follow   up through the emergency room should she have any significant developments   and if her headache returns to once again contact [facility] to    see if she needs another blood patch. The patient understands her   instructions for discharge and agrees to follow up if she develops any   further problems.   Electronically Signed By:   Theo Dills, M.D. 12/22/2006 12:13   _________________________________   Theo Dills, M.D.   PS1  D:  11/23/2006  T:  11/24/2006  8:03 A   [id number]

## 2007-06-05 NOTE — Procedures (Signed)
[facility]                               STRESS ECHO-(ECHO)   NAME:   [name], [name] B                                SS#:   [id number]   DOB:     01/08/1971                                     AGE:        37   SEX:     F                                           ROOM#:     OP   MR#:    [id number]                                       DATE:   06/05/2007   REFERRING PHYS:   HOWARD R. FELDMAN, M.D.                 TAPE/INDEX:   Referring Physician:  Dr. Feldman       WALL MOTION ANALYSIS   LV WALL SEGMENT              PRE-EXERCISE             POST-EXERCISE   Basal Anteroseptal              Normal                 Hyperkinesis   Basal Septal                    Normal                 Hyperkinesis   Basal Inferoseptal              Normal                 Hyperkinesis   Basal Posterior                 Normal                 Hyperkinesis   Basal Lateral                   Normal                 Hyperkinesis   Basal Anterior                  Normal                 Hyperkinesis   Mid Anteroseptal                Normal                 Hyperkinesis   Mid Septal                        Normal                 Hyperkinesis   Mid Inferoseptal                Normal                 Hyperkinesis   Mid Posterior                   Normal                 Hyperkinesis   Mid Lateral                     Normal                 Hyperkinesis   Mid Anterior                    Normal                 Hyperkinesis   Apical Septal                   Normal                 Hyperkinesis   Apical Inferior                 Normal                 Hyperkinesis   Apical Lateral                  Normal                 Hyperkinesis   Apical Anterior                 Normal                 Hyperkinesis   LV  Chamber Size                                         Smaller   ECG INTERPRETATION:  Negative by ECG criteria.   ECHO INTERPRETATION:  Normal wall motion.   OVERALL IMPRESSION:  Negative for ischemia.   Electronically Signed  By:   CHARLES ASHBY, JR, M.D. 06/09/2007 10:25   ______________________________   MOHIT BHASIN, M.D.   Values Dictated by KATHRYN NEWCOMB, ECHOTECH   AK D: 06/05/2007  T: 06/05/2007 10:37 P    [id number]   cc:   MOHIT BHASIN, M.D.         HOWARD R. FELDMAN, M.D.

## 2007-06-05 NOTE — Procedures (Signed)
[facility]                               STRESS ECHO-(ECHO)   NAME:   [name] [name], [name] [name]                                SS#:   [id number]   DOB:     01-20-1971                                     AGE:        37   SEX:     F                                           ROOM#:     OP   MR#:    [id number]                                       DATE:   06/05/2007   REFERRING PHYS:   Vivianne Master, M.D.                 TAPE/INDEX:   Referring Physician:  Dr. Gibson Ramp       WALL MOTION ANALYSIS   LV WALL SEGMENT              PRE-EXERCISE             POST-EXERCISE   Basal Anteroseptal              Normal                 Hyperkinesis   Basal Septal                    Normal                 Hyperkinesis   Basal Inferoseptal              Normal                 Hyperkinesis   Basal Posterior                 Normal                 Hyperkinesis   Basal Lateral                   Normal                 Hyperkinesis   Basal Anterior                  Normal                 Hyperkinesis   Mid Anteroseptal                Normal                 Hyperkinesis   Mid Septal  Normal                 Hyperkinesis   Mid Inferoseptal                Normal                 Hyperkinesis   Mid Posterior                   Normal                 Hyperkinesis   Mid Lateral                     Normal                 Hyperkinesis   Mid Anterior                    Normal                 Hyperkinesis   Apical Septal                   Normal                 Hyperkinesis   Apical Inferior                 Normal                 Hyperkinesis   Apical Lateral                  Normal                 Hyperkinesis   Apical Anterior                 Normal                 Hyperkinesis   LV  Chamber Size                                         Smaller   ECG INTERPRETATION:  Negative by ECG criteria.   ECHO INTERPRETATION:  Normal wall motion.   OVERALL IMPRESSION:  Negative for ischemia.   Electronically Signed  By:   Donnel Saxon, M.D. 06/09/2007 10:25   ______________________________   Osa Craver, M.D.   Values Dictated by Nancie Neas, ECHOTECH   AK D: 06/05/2007  T: 06/05/2007 10:37 P    [id number]   cc:   Osa Craver, M.D.         Vivianne Master, M.D.

## 2007-06-05 NOTE — Procedures (Signed)
Acquisition Time: 2007-06-05  08:54:11   Total Exercise Time: 450 secs   Test Indications: CHEST PAIN   Medications:   Protocol: BRUCE STRESS   Max HR: 169 BPM  93% of  Pred: 181 BPM   Max BP: 127/064 mmHG   Max Work Load: 9.2 METS   Conclusion: Please Correlate with Echo Report   Confirmed by Francisco Capuchin, M.D., Mohit ([id number]) on 06/05/2007 9:53:18 AM   Referred By:  Thayer Jew           Overread By: Osa Craver, M.D.

## 2007-06-05 NOTE — Procedures (Signed)
Acquisition Time: 2007-06-05  08:54:11   Total Exercise Time: 450 secs   Test Indications: CHEST PAIN   Medications:   Protocol: BRUCE STRESS   Max HR: 169 BPM  93% of  Pred: 181 BPM   Max BP: 127/064 mmHG   Max Work Load: 9.2 METS   Conclusion: Please Correlate with Echo Report   Confirmed by Bhasin, M.D., Mohit ([id number]) on 06/05/2007 9:53:18 AM   Referred By:  FELDMAN, HOWARD           Overread By: Mohit Bhasin, M.D.

## 2007-06-05 NOTE — Procedures (Signed)
[facility]                               STRESS ECHO-(ECHO)   NAME:   [name] [name], [name] [name]                                SS#:   [id number]   DOB:     1970/03/17                                     AGE:        37   SEX:     F                                           ROOM#:     OP   MR#:    [id number]                                       DATE:   06/05/2007   REFERRING PHYS:   Vivianne Master, M.D.                 TAPE/INDEX:   Referring Physician:  Dr. Gibson Ramp       WALL MOTION ANALYSIS   LV WALL SEGMENT              PRE-EXERCISE             POST-EXERCISE   Basal Anteroseptal              Normal                 Hyperkinesis   Basal Septal                    Normal                 Hyperkinesis   Basal Inferoseptal              Normal                 Hyperkinesis   Basal Posterior                 Normal                 Hyperkinesis   Basal Lateral                   Normal                 Hyperkinesis   Basal Anterior                  Normal                 Hyperkinesis   Mid Anteroseptal                Normal                 Hyperkinesis   Mid Septal  Normal                 Hyperkinesis   Mid Inferoseptal                Normal                 Hyperkinesis   Mid Posterior                   Normal                 Hyperkinesis   Mid Lateral                     Normal                 Hyperkinesis   Mid Anterior                    Normal                 Hyperkinesis   Apical Septal                   Normal                 Hyperkinesis   Apical Inferior                 Normal                 Hyperkinesis   Apical Lateral                  Normal                 Hyperkinesis   Apical Anterior                 Normal                 Hyperkinesis   LV  Chamber Size                                         Smaller   ECG INTERPRETATION:  Negative by ECG criteria.   ECHO INTERPRETATION:  Normal wall motion.   OVERALL IMPRESSION:  Negative for ischemia.    Electronically Signed By:   Donnel Saxon, M.D. 06/09/2007 10:25   ______________________________   Osa Craver, M.D.   Values Dictated by Nancie Neas, ECHOTECH   AK D: 06/05/2007  T: 06/05/2007 10:37 P    [id number]   cc:   Osa Craver, M.D.         Vivianne Master, M.D.

## 2007-06-05 NOTE — Procedures (Signed)
[facility]                               STRESS ECHO-(ECHO)   NAME:   [name] [name], [name] [name]                                SS#:   [id number]   DOB:     08/29/1970                                     AGE:        37   SEX:     F                                           ROOM#:     OP   MR#:    [id number]                                       DATE:   06/05/2007   REFERRING PHYS:   HOWARD R. FELDMAN, M.D.                 TAPE/INDEX:   Referring Physician:  Dr. Feldman       WALL MOTION ANALYSIS   LV WALL SEGMENT              PRE-EXERCISE             POST-EXERCISE   Basal Anteroseptal              Normal                 Hyperkinesis   Basal Septal                    Normal                 Hyperkinesis   Basal Inferoseptal              Normal                 Hyperkinesis   Basal Posterior                 Normal                 Hyperkinesis   Basal Lateral                   Normal                 Hyperkinesis   Basal Anterior                  Normal                 Hyperkinesis   Mid Anteroseptal                Normal                 Hyperkinesis   Mid Septal                        Normal                 Hyperkinesis   Mid Inferoseptal                Normal                 Hyperkinesis   Mid Posterior                   Normal                 Hyperkinesis   Mid Lateral                     Normal                 Hyperkinesis   Mid Anterior                    Normal                 Hyperkinesis   Apical Septal                   Normal                 Hyperkinesis   Apical Inferior                 Normal                 Hyperkinesis   Apical Lateral                  Normal                 Hyperkinesis   Apical Anterior                 Normal                 Hyperkinesis   LV  Chamber Size                                         Smaller   ECG INTERPRETATION:  Negative by ECG criteria.   ECHO INTERPRETATION:  Normal wall motion.   OVERALL IMPRESSION:  Negative for ischemia.    Electronically Signed By:   CHARLES ASHBY, JR, M.D. 06/09/2007 10:25   ______________________________   MOHIT BHASIN, M.D.   Values Dictated by KATHRYN NEWCOMB, ECHOTECH   AK D: 06/05/2007  T: 06/05/2007 10:37 P    [id number]   cc:   MOHIT BHASIN, M.D.         HOWARD R. FELDMAN, M.D.

## 2008-07-08 LAB — CULTURE, URINE
Culture result:: NO GROWTH
Culture: NO GROWTH

## 2008-09-03 LAB — CULTURE, URINE
Culture result:: NO GROWTH
Culture: NO GROWTH

## 2011-05-30 NOTE — Procedures (Signed)
[facility]                  [address], [place], [place]  [address]                          SOMATOSENSORY EVOKED RESPONSES      PATIENT:    [name] [name], [name] [name]  MRN:            [id number]     DATE:       05/23/2011  BILLING:        [id number]    LOCATION  REFERRING:  DICTATING:  [name], MD      PROCEDURE:  Upper extremity somatosensory evoked responses.    EP:  13-149    DESCRIPTION:  Well formed reproducible waves were obtained to median nerve  stimulation on the right.  On the left, subcortical waves were obtained but  a cortical channel was noisy and a latency for the cortical wave could not  be determined on that side.    With right median nerve stimulation, Erb point was at 9.0 ms.  The cervical  N13 was 11.6 ms.  The subcortical P14 was at 12 ms, and the cortical N19  was at 14.1 ms.  The interval from Erb point to N13 was 2.6 ms, which was  normal.  From N13 to N19 was 2.5 ms, also normal, and the central  conduction time from Erb point to N19 was 5.1 ms, normal on the right.    On the left side, the Erb point potential was normal and symmetric at 9.1  ms and the cervical N13 was normal and symmetric at 12.2 ms.  The  subcortical P14 was normal and symmetric at 12.1 ms, but a cortical latency  could not be determined.    IMPRESSION:  These evoked potentials are normal with right median nerve  stimulation, and the subcortical waves with left median nerve stimulation  are normal.  A cortical wave could not be obtained on the left due to  excessive noise in the cortical channel.  If clinically indicated, a repeat  study with sedation may give more accurate results.                                                        [name], MD    JPH:wmx  D: 05/30/2011  3:26 P T: 05/30/2011  8:51 P  Job #:  [id number]  CScriptDoc #:  [id number]  cc:   Vivianne Master, MD        [name], MD

## 2011-05-30 NOTE — Procedures (Signed)
[facility]                  [address] [place], [place]  [address]                        BRAINSTEM AUDITORY EVOKED RESPONSES    PATIENT:    [name] [name], [name] [name]  MRN:            [id number]     DATE:       05/23/2011  BILLING:        [id number]    LOCATION  REFERRING:  DICTATING:  [name], MD      Repetition rate:  Marland Kitchen  Masking:  .  Stimulus intensity:  .  Thresholds  (r/l): Marland Kitchen  Sampling time:  .  High-frequency filter:  .  Low-frequency  filter:  .  Repetitions averaged:  I-             I     III    V        I-III    III-V    I-V       V/I                          (<6.2)   (<2.65)  (<2.3)   (<4.6)    (>0.5)  Right  Left  Difference                                 (<0.5)   (<0.5)  (<0.5)      PROCEDURE: Brainstem auditory evoked responses.    EP 13-148    DESCRIPTION: Well-formed, reproducible waves were obtained to quick  stimulation bilaterally using 90 dB stimulation and 20 dB contralateral  masking.    With right ear stimulation, wave 1 was at 1.6 ms, wave 3 was at 3.7 ms, and  wave 5 was at 5.4 ms.    With left ear stimulation, wave 1 was at 1.6 ms, wave 3 was at 3.5 ms, and  wave 5 was 5.5 ms.    The intervals from wave 1 to wave 3 were 2.1 ms on the right and 1.9 ms on  the left, from wave 3 to wave 5 were 1.7 ms on the right and 2 ms on the  left, and from wave 1 to wave 5 was 3.8 ms on the right and 3.9 ms on the  left, the difference being 0.1 ms.    IMPRESSION: These are normal and symmetric brainstem auditory evoked  responses bilaterally.                                                        [name], MD    JPH:wmx  D: 05/30/2011  3:26 P T: 05/30/2011  8:41 P  Job #:  [id number]  CScriptDoc #:  [id number]  cc:   Vivianne Master, MD        [name], MD

## 2011-05-30 NOTE — Procedures (Signed)
[facility]                  [address], [place], [place]  [address]                              VISUAL EVOKED RESPONSES    PATIENT:    [name] [name], [name] [name]  MRN:            [id number]     DATE:       05/23/2011  BILLING:        [id number]    LOCATION  REFERRING:  DICTATING:  [name], MD        Check size:  .  Repetition rate:  .  Responses averaged:                    N75               P100 (<125.1)     Amplitude  Right  Left  Difference                                      (<11.3 ms)        (<2.5-fold)  13-147    CLINICAL INFORMATION:  Question of demyelinating disease.    PROCEDURE:  Visual evoked response.    DESCRIPTION:  Well-formed, reproducible waves were obtained to pattern  reversal stimulation bilaterally using 30 minute checks.    With left eye stimulation, where the visual acuity was 20/30, N75 was at  80.5 ms and P100 was at 106.3 ms.    With right eye stimulation, where the visual acuity was 20/30, N75 was 80.1  ms and P100 was at 104.3 ms.    The intraocular latency difference in the P100 potential was 2 ms.    IMPRESSION:  These are normal and symmetric pattern  reversal visual evoked  responses bilaterally.                                                        [name], MD    JPH:wmx  D: 05/30/2011  3:26 P T: 05/30/2011  8:37 P  Job #:  [id number]  CScriptDoc #:  [id number]  cc:   Vivianne Master, MD        [name], MD

## 2011-05-30 NOTE — Procedures (Signed)
[facility]                  [address], [place], [place]  [address]                          SOMATOSENSORY EVOKED RESPONSES      PATIENT:    [name] [name], [name] [name]  MRN:            [id number]     DATE:       05/23/2011  BILLING:        [id number]    LOCATION  REFERRING:  DICTATING:  Pharaoh Pio P Chinita Schimpf, MD      PROCEDURE:  Upper extremity somatosensory evoked responses.    EP:  13-149    DESCRIPTION:  Well formed reproducible waves were obtained to median nerve  stimulation on the right.  On the left, subcortical waves were obtained but  a cortical channel was noisy and a latency for the cortical wave could not  be determined on that side.    With right median nerve stimulation, Erb point was at 9.0 ms.  The cervical  N13 was 11.6 ms.  The subcortical P14 was at 12 ms, and the cortical N19  was at 14.1 ms.  The interval from Erb point to N13 was 2.6 ms, which was  normal.  From N13 to N19 was 2.5 ms, also normal, and the central  conduction time from Erb point to N19 was 5.1 ms, normal on the right.    On the left side, the Erb point potential was normal and symmetric at 9.1  ms and the cervical N13 was normal and symmetric at 12.2 ms.  The  subcortical P14 was normal and symmetric at 12.1 ms, but a cortical latency  could not be determined.    IMPRESSION:  These evoked potentials are normal with right median nerve  stimulation, and the subcortical waves with left median nerve stimulation  are normal.  A cortical wave could not be obtained on the left due to  excessive noise in the cortical channel.  If clinically indicated, a repeat  study with sedation may give more accurate results.                                                        Brenin Heidelberger P Reyne Falconi, MD    JPH:wmx  D: 05/30/2011  3:26 P T: 05/30/2011  8:51 P  Job #:  [id number]  CScriptDoc #:  [id number]  cc:   HOWARD R FELDMAN, MD        Lorianne Malbrough P Peggi Yono, MD

## 2011-05-30 NOTE — Procedures (Signed)
[facility]                  [address], [place], [place]  [address]                        BRAINSTEM AUDITORY EVOKED RESPONSES    PATIENT:    [name] [name], [name] [name]  MRN:            [id number]     DATE:       05/23/2011  BILLING:        [id number]    LOCATION  REFERRING:  DICTATING:  Tameka Hoiland P Meegan Shanafelt, MD      Repetition rate:  .  Masking:  .  Stimulus intensity:  .  Thresholds  (r/l): .  Sampling time:  .  High-frequency filter:  .  Low-frequency  filter:  .  Repetitions averaged:  I-             I     III    V        I-III    III-V    I-V       V/I                          (<6.2)   (<2.65)  (<2.3)   (<4.6)    (>0.5)  Right  Left  Difference                                 (<0.5)   (<0.5)  (<0.5)      PROCEDURE: Brainstem auditory evoked responses.    EP 13-148    DESCRIPTION: Well-formed, reproducible waves were obtained to quick  stimulation bilaterally using 90 dB stimulation and 20 dB contralateral  masking.    With right ear stimulation, wave 1 was at 1.6 ms, wave 3 was at 3.7 ms, and  wave 5 was at 5.4 ms.    With left ear stimulation, wave 1 was at 1.6 ms, wave 3 was at 3.5 ms, and  wave 5 was 5.5 ms.    The intervals from wave 1 to wave 3 were 2.1 ms on the right and 1.9 ms on  the left, from wave 3 to wave 5 were 1.7 ms on the right and 2 ms on the  left, and from wave 1 to wave 5 was 3.8 ms on the right and 3.9 ms on the  left, the difference being 0.1 ms.    IMPRESSION: These are normal and symmetric brainstem auditory evoked  responses bilaterally.                                                        Johnte Portnoy P Bronsyn Shappell, MD    JPH:wmx  D: 05/30/2011  3:26 P T: 05/30/2011  8:41 P  Job #:  [id number]  CScriptDoc #:  [id number]  cc:   HOWARD R FELDMAN, MD        Vanessa Kampf P Ronnette Rump, MD

## 2011-05-30 NOTE — Procedures (Signed)
[facility]                  [address], [place], [place]  [address]                              VISUAL EVOKED RESPONSES    PATIENT:    [name] [name], [name] [name]  MRN:            [id number]     DATE:       05/23/2011  BILLING:        [id number]    LOCATION  REFERRING:  DICTATING:  Gustavo Dispenza P Omri Bertran, MD        Check size:  .  Repetition rate:  .  Responses averaged:                    N75               P100 (<125.1)     Amplitude  Right  Left  Difference                                      (<11.3 ms)        (<2.5-fold)  13-147    CLINICAL INFORMATION:  Question of demyelinating disease.    PROCEDURE:  Visual evoked response.    DESCRIPTION:  Well-formed, reproducible waves were obtained to pattern  reversal stimulation bilaterally using 30 minute checks.    With left eye stimulation, where the visual acuity was 20/30, N75 was at  80.5 ms and P100 was at 106.3 ms.    With right eye stimulation, where the visual acuity was 20/30, N75 was 80.1  ms and P100 was at 104.3 ms.    The intraocular latency difference in the P100 potential was 2 ms.    IMPRESSION:  These are normal and symmetric pattern  reversal visual evoked  responses bilaterally.                                                        Maleiyah Releford P Hatcher Froning, MD    JPH:wmx  D: 05/30/2011  3:26 P T: 05/30/2011  8:37 P  Job #:  [id number]  CScriptDoc #:  [id number]  cc:   HOWARD R FELDMAN, MD        Vlada Uriostegui P Marlita Keil, MD

## 2011-06-05 NOTE — Procedures (Signed)
[facility]                  [address], [place], [place]  [address]                          SOMATOSENSORY EVOKED RESPONSES      PATIENT:    [name] [name], [name] [name]  MRN:            [id number]     DATE:       05/23/2011  BILLING:        [id number]    LOCATION  REFERRING:  DICTATING:  [name], MD      PROCEDURE: Upper extremity somatosensory evoked responses.    EP #: [id number]    DESCRIPTION: Well-formed reproducible subcortical waves were obtained to  median nerve stimulation bilaterally. With left median nerve stimulation,  the cortical channel was noisy and no wave could be identified. On that  side, the Erb point potential was at 9.1 ms, the N13 was at 12.2 ms, and  the P14 was 12.1 ms.    With right median nerve stimulation, the Erb point potential was at 9 ms,  N13 was 11.6 ms, and the P14 was at 12 ms. The cortical wave N20 was at  14.1 ms.    IMPRESSION:  These upper extremity somatosensory evoked responses are not  satisfactory for interpretation. A repeat study with sedation should be  requested if clinically indicated.                                                        [name], MD    JPH:wmx  D: 06/05/2011  5:36 P T: 06/05/2011  9:17 P  Job #:  [id number]  CScriptDoc #:  [id number]  cc:   Vivianne Master, MD        [name], MD

## 2011-06-05 NOTE — Procedures (Signed)
[facility]                  [address], [place], [place]  [address]                        BRAINSTEM AUDITORY EVOKED RESPONSES    PATIENT:    [name] [name], [name] [name]  MRN:            [id number]     DATE:       05/23/2011  BILLING:        [id number]    LOCATION  REFERRING:  DICTATING:  [name], MD      Repetition rate:  Marland Kitchen  Masking:  .  Stimulus intensity:  .  Thresholds  (r/l): Marland Kitchen  Sampling time:  .  High-frequency filter:  .  Low-frequency  filter:  .  Repetitions averaged:  I-             I     III    V        I-III    III-V    I-V       V/I                          (<6.2)   (<2.65)  (<2.3)   (<4.6)    (>0.5)  Right  Left  Difference                                 (<0.5)   (<0.5)  (<0.5)      DESCRIPTION:  Well-formed reproducible waves were obtained to quick  stimulation using 90 dB stimulation with contralateral 20 dB masking.    With right ear stimulation, wave 1 was at 1.6 ms, wave 3 at 3.7 ms, and  wave 5 at 5.4 ms.    With left ear stimulation, wave 1 was at 1.6 ms, wave 3 at 3.5 ms, and wave  5 at 5.5 ms.    The intervals from wave 1 to wave 3, 2.1 ms on the right and 1.9 ms on the  left, from wave 3 to wave 5, 1.7 ms on the right and 2 ms on the left, and  from wave 1 to wave 5, 3.8 ms on the right and 3.9 ms on the left, the  difference being 0.1 ms.    IMPRESSION: These are normal and symmetric brain stem auditory evoked  responses bilaterally.                                                        [name], MD    JPH:wmx  D: 06/05/2011  5:36 P T: 06/05/2011  9:11 P  Job #:  [id number]  CScriptDoc #:  [id number]  cc:   Vivianne Master, MD        [name], MD

## 2011-06-05 NOTE — Procedures (Signed)
[facility]                  [address] [place], [place]  [address]                              VISUAL EVOKED RESPONSES    PATIENT:    [name] [name], [name] [name]  MRN:            [id number]     DATE:       05/23/2011  BILLING:        [id number]    LOCATION  REFERRING:  DICTATING:  [name], MD      EP #: [id number]    CLINICAL INFORMATION: Question of demyelinating disease.    DESCRIPTION: Well-formed reproducible waves were obtained to pattern  reversal stimulation bilaterally using 30 minute checks.    With left eye stimulation, where the visual acuity was 20/30, N75 was at  80.5 ms and P100 was at 106.3 ms.    With right eye stimulation, where the visual acuity was 20/30, N75 was 80.1  ms and P100 was at 104.3 ms.    The left to right intraocular latency difference was 3.0 ms.    IMPRESSION: These are normal and symmetric visual evoked responses  bilaterally.                                                        [name], MD    JPH:wmx  D: 06/05/2011  5:36 P T: 06/05/2011  9:04 P  Job #:  [id number]  CScriptDoc #:  [id number]  cc:   Vivianne Master, MD        [name], MD

## 2011-06-05 NOTE — Procedures (Signed)
[facility]                  [address], [place], [place]  [address]                        BRAINSTEM AUDITORY EVOKED RESPONSES    PATIENT:    [name] [name], [name] [name]  MRN:            [id number]     DATE:       05/23/2011  BILLING:        [id number]    LOCATION  REFERRING:  DICTATING:  Kadee Philyaw P Delaine Canter, MD      Repetition rate:  .  Masking:  .  Stimulus intensity:  .  Thresholds  (r/l): .  Sampling time:  .  High-frequency filter:  .  Low-frequency  filter:  .  Repetitions averaged:  I-             I     III    V        I-III    III-V    I-V       V/I                          (<6.2)   (<2.65)  (<2.3)   (<4.6)    (>0.5)  Right  Left  Difference                                 (<0.5)   (<0.5)  (<0.5)      DESCRIPTION:  Well-formed reproducible waves were obtained to quick  stimulation using 90 dB stimulation with contralateral 20 dB masking.    With right ear stimulation, wave 1 was at 1.6 ms, wave 3 at 3.7 ms, and  wave 5 at 5.4 ms.    With left ear stimulation, wave 1 was at 1.6 ms, wave 3 at 3.5 ms, and wave  5 at 5.5 ms.    The intervals from wave 1 to wave 3, 2.1 ms on the right and 1.9 ms on the  left, from wave 3 to wave 5, 1.7 ms on the right and 2 ms on the left, and  from wave 1 to wave 5, 3.8 ms on the right and 3.9 ms on the left, the  difference being 0.1 ms.    IMPRESSION: These are normal and symmetric brain stem auditory evoked  responses bilaterally.                                                        Monalisa Bayless P Lira Stephen, MD    JPH:wmx  D: 06/05/2011  5:36 P T: 06/05/2011  9:11 P  Job #:  [id number]  CScriptDoc #:  [id number]  cc:   HOWARD R FELDMAN, MD        Josearmando Kuhnert P Chester Sibert, MD

## 2011-06-05 NOTE — Procedures (Signed)
[facility]                  [address], [place], [place]  [address]                              VISUAL EVOKED RESPONSES    PATIENT:    [name] [name], [name] [name]  MRN:            [id number]     DATE:       05/23/2011  BILLING:        [id number]    LOCATION  REFERRING:  DICTATING:  Honour Schwieger P Diamonds Lippard, MD      EP #: [id number]    CLINICAL INFORMATION: Question of demyelinating disease.    DESCRIPTION: Well-formed reproducible waves were obtained to pattern  reversal stimulation bilaterally using 30 minute checks.    With left eye stimulation, where the visual acuity was 20/30, N75 was at  80.5 ms and P100 was at 106.3 ms.    With right eye stimulation, where the visual acuity was 20/30, N75 was 80.1  ms and P100 was at 104.3 ms.    The left to right intraocular latency difference was 3.0 ms.    IMPRESSION: These are normal and symmetric visual evoked responses  bilaterally.                                                        Yuchen Fedor P Tatiyanna Lashley, MD    JPH:wmx  D: 06/05/2011  5:36 P T: 06/05/2011  9:04 P  Job #:  [id number]  CScriptDoc #:  [id number]  cc:   HOWARD R FELDMAN, MD        Harshan Kearley P Kimia Finan, MD

## 2011-06-05 NOTE — Procedures (Signed)
[facility]                  [address], [place], [place]  [address]                          SOMATOSENSORY EVOKED RESPONSES      PATIENT:    [name] [name], [name] [name]  MRN:            [id number]     DATE:       05/23/2011  BILLING:        [id number]    LOCATION  REFERRING:  DICTATING:  Eugune Sine P Kassi Esteve, MD      PROCEDURE: Upper extremity somatosensory evoked responses.    EP #: [id number]    DESCRIPTION: Well-formed reproducible subcortical waves were obtained to  median nerve stimulation bilaterally. With left median nerve stimulation,  the cortical channel was noisy and no wave could be identified. On that  side, the Erb point potential was at 9.1 ms, the N13 was at 12.2 ms, and  the P14 was 12.1 ms.    With right median nerve stimulation, the Erb point potential was at 9 ms,  N13 was 11.6 ms, and the P14 was at 12 ms. The cortical wave N20 was at  14.1 ms.    IMPRESSION:  These upper extremity somatosensory evoked responses are not  satisfactory for interpretation. A repeat study with sedation should be  requested if clinically indicated.                                                        Vinnie Bobst P Chakita Mcgraw, MD    JPH:wmx  D: 06/05/2011  5:36 P T: 06/05/2011  9:17 P  Job #:  [id number]  CScriptDoc #:  [id number]  cc:   HOWARD R FELDMAN, MD        Lord Lancour P Anabell Swint, MD

## 2012-01-10 LAB — CBC WITH AUTOMATED DIFF
BASOPHILS: 0 % (ref 0–3)
EOSINOPHILS: 7 % — ABNORMAL HIGH (ref 0–5)
HCT: 40.9 % (ref 37.0–50.0)
HGB: 13.3 gm/dl (ref 13.0–17.2)
LYMPHOCYTES: 25 % — ABNORMAL LOW (ref 28–48)
MCH: 31.8 pg (ref 25.4–34.6)
MCHC: 32.4 gm/dl (ref 30.0–36.0)
MCV: 97.9 fL (ref 80.0–98.0)
MONOCYTES: 6 % (ref 1–13)
MPV: 7.8 fL (ref 6.0–10.0)
NEUTROPHILS: 62 % (ref 34–64)
NRBC: 0 (ref 0–0)
PLATELET: 362 10*3/uL (ref 140–450)
RBC: 4.18 M/uL (ref 3.60–5.20)
RDW: 13.6 % (ref 11.5–14.0)
WBC: 8.3 10*3/uL (ref 4.0–11.0)

## 2012-01-10 LAB — CELL COUNT, CSF
CSF RBCs: 0 /mm3 (ref 0–10)
CSF RBCs: 3 /mm3 (ref 0–10)
CSF WBCs: 1 /mm3 (ref 0–10)
CSF WBCs: 2 /mm3 (ref 0–10)

## 2012-01-10 LAB — CRYPTOCOCCUS AG TITER, CSF: Cryptococcus Ag, CSF: NEGATIVE

## 2012-01-10 LAB — PROTHROMBIN TIME + INR
INR: 1 (ref 0.0–1.1)
Prothrombin time: 13.3 seconds (ref 11.5–14.0)

## 2012-01-10 LAB — PROTEIN, CSF: Protein,CSF: 31.4 mg/dl (ref 15.0–45.0)

## 2012-01-10 LAB — PTT: aPTT: 34.8 seconds (ref 24.9–35.6)

## 2012-01-10 LAB — GLUCOSE, CSF: Glucose,CSF: 61 mg/dl (ref 40–70)

## 2012-01-13 LAB — VDRL, CSF: VDRL,CSF: NONREACTIVE

## 2012-01-14 LAB — IMMUNOELECTROPHORESIS (IMMUNOFIX.)
ALPHA-2 GLOBULIN: 0.8 g/dL (ref 0.5–1.0)
Abnormal Protein Band 1: NOT DETECTED
Albumin: 3.8 g/dL (ref 3.5–4.7)
Alpha-1-globulin: 0.3 g/dL (ref 0.1–0.3)
Beta globulin: 1 g/dL (ref 0.8–1.4)
Gamma globulin: 1.1 g/dL (ref 0.6–1.6)
IMMUNOGLOBULIN G: 1078 mg/dL (ref 694–1618)
IMMUNOGLOBULIN M: 145 mg/dL (ref 48–271)
IgA TOTAL: 205 mg/dL (ref 81–463)
K:L Free Light Chain ratio: 1.75 (ref 1.35–2.95)
KAPPA QT, free lt chains: 249 mg/dL (ref 158–502)
LAMBDA QT, free lt chains: 142 mg/dL (ref 100–317)
Protein, total: 6.9 g/dL (ref 6.1–8.1)

## 2012-01-14 LAB — PROTEIN ELECTROPHORESIS
ALPHA-2 GLOBULIN: 0.8 g/dL (ref 0.5–1.0)
Abnormal Protein Band 1: NOT DETECTED
Albumin: 3.7 g/dL (ref 3.5–4.7)
Alpha-1-globulin: 0.2 g/dL (ref 0.1–0.3)
Beta globulin: 1 g/dL (ref 0.8–1.4)
Gamma globulin: 1.1 g/dL (ref 0.6–1.6)
Protein, total: 6.9 g/dL (ref 6.1–8.1)

## 2012-01-15 LAB — MS PROFILE+MBP, CSF
ALBUMIN, CSF,ALBC: 15 mg/dL (ref 8.0–42.0)
Albumin, CSF: 15 mg/dL (ref 8.0–42.0)
Albumin: 4.3 g/dL (ref 3.5–4.9)
Albumin: 4.3 g/dL (ref 3.5–4.9)
IGG INDEX, CSF, 13924A: 0.43 (ref <0.66)
IGG SYNTHESIS RATE, CSF, MSP6T: -5 mg/24 h (ref <=3.3)
IGG, CSF, 6444: 1.8 mg/dL (ref 0.8–7.7)
IMMUNOGLOBULIN G,IGG: 1200 mg/dL (ref 694–1618)
IMMUNOGLOBULIN G: 1200 mg/dL (ref 694–1618)
IgG Index, CSF: 0.43 (ref <0.66)
IgG Synthesis Rate, CSF: -5 mg/24 h (ref <=3.3)
IgG, CSF: 1.8 mg/dL (ref 0.8–7.7)
MYELIN BASIC PROTEIN: <2 mcg/L (ref 0.0–4.0)
Myelin basic protein: <2 mcg/L (ref 0.0–4.0)

## 2012-01-19 LAB — CULTURE, VIRAL

## 2012-12-06 NOTE — ED Provider Notes (Signed)
[facility]  EMERGENCY DEPARTMENT TREATMENT REPORT  NAME:  [name], [name]  SEX:   F  ADMIT: 12/06/2012  DOB:   1970-11-30  MR#    [id number]  ROOM:    TIME DICTATED: 08 51 PM  ACCT#  [id number]    cc: [name] [name]    TIME OF EVALUATION:    1930    PRIMARY CARE PHYSICIAN:  Dr. Thayer [name]    CHIEF COMPLAINT:  Stomach pain.    HISTORY OF PRESENT ILLNESS:  A 42 year old female who relates gradual worsening right upper and right lower   abdominal pain since last evening that has been constant.  No exacerbating or   relieving factors, now associated with nausea, no vomiting.  Presents for   further evaluation.  Pain described as sharp and aching.    REVIEW OF SYSTEMS:  CONSTITUTIONAL:  No fevers or shivering chills.  EYES:   No visual symptoms.   ENT:  No sore throat, runny nose, or other URI symptoms.   RESPIRATORY:  No cough, shortness of breath, or wheezing.   CARDIOVASCULAR:  No chest pain, chest pressure, or palpitations.   GASTROINTESTINAL:  As above.  Bowel movements have been entirely normal with   no constipation or diarrhea.  GENITOURINARY:  No dysuria, frequency, or urgency. No vaginal bleeding or   discharge.  MUSCULOSKELETAL:  No joint pain or swelling.   INTEGUMENTARY:  No rashes.   NEUROLOGICAL:  No headaches, sensory or motor symptoms.     PAST MEDICAL HISTORY:  Migraines with negative workups, fibromyalgia, celiac disease.    PAST SURGICAL HISTORY:  C-section, hernia repair, sinus polyp removal, lumpectomy to breast, anxiety,   depression.    SOCIAL HISTORY:  Social drinker, nonsmoker or drug use.    FAMILY HISTORY:  Noncontributory.    ALLERGIES:  DEMEROL.    MEDICATIONS:  Multiple and reviewed in Ibex.    PHYSICAL EXAMINATION:  VITAL SIGNS:  Blood pressure 141/71, pulse 62, respirations 18, temperature   97.5 orally, pain 6 out of 10, O2 sats 100% on room air.  GENERAL APPEARANCE:  Patient appears well developed and well nourished.     Appearance and behavior are age and situation appropriate.   Ears/Nose:  Hearing is grossly intact to voice.  Internal and external   examinations of the ears and nose are unremarkable. Mouth/Throat:  Surfaces of   the pharynx, palate, and tongue are pink, moist, and without lesions. Nasal   mucosa, septum, and turbinates unremarkable. Teeth and gums unremarkable.   RESPIRATORY:  Clear and equal breath sounds.  No respiratory distress,   tachypnea, or accessory muscle use.     CARDIOVASCULAR:   Heart regular, without murmurs, gallops, rubs, or thrills.       ABDOMEN: Soft, tender to palpation, predominantly in the  right upper and   right lower quadrants.  Negative  Murphy's sign. Positive Rovsing sign.  No   guarding. The patient has questionable rebound to right lower quadrant.    GENITOURINARY:  External genitalia without swelling, lesions, or discharge.     Vaginal walls have no bulging or lesions.  Cervix pink with no lesions or   discharge noted.  No pain on cervical motion.  Uterus movable without mass or   tenderness.  No adnexal mass or tenderness noted. Performed with Shelbie Hutching at bedside.    MUSCULOSKELETAL: Stance and gait appear normal.   SPINE:  There is no localized cervical, thoracic, lumbar or sacral  body   tenderness to palpation or fist percussion.  There are no bony step-offs,   ecchymosis, areas of soft tissue swelling or deformities.   NEUROLOGIC:  Alert, oriented.  Sensation intact, motor strength equal and   symmetric.     CONTINUATION BY [name] P. HENDRICK, PA:     INITIAL ASSESSMENT AND MANAGEMENT PLAN:  A 42 year old female with acute abdominal pain.  The patient has no   significant pain on adnexal examination. I suspect this is abdominal source.    We will obtain CT to rule out appendicitis. There is no concern for colitis.    His bowel movements have been normal.  We will medicate symptomatically and   also obtain screening laboratory studies for acute infectious inflammatory    pancreatic, hepatic or metabolic abnormalities and urine studies to evaluate   for infection.    DIAGNOSTIC STUDIES:  CT abdomen and pelvis showed right ovarian cyst.  Appendix within normal   limits, intrauterine contraceptive device in satisfactory position. Cyst   measuring 2.8 cm with recommended followup  ultrasound to document clearance   per Radiology.  CMP showed chloride 109, calcium 8.4, otherwise unremarkable.    Lipase normal.  Creatinine 1.  Microscopic urine showed 2+ crystals, 1+   bacteria, occasional WBCs, 5 to 9 squamous cells.  CBC showed normal white   blood cell count, MPV of 10.8, lymphocytes 25.6%, otherwise unremarkable.    Urine pregnancy is negative.  Point of care urinalysis showed trace leukocyte   esterase, otherwise unremarkable.    COURSE:  The patient was stable in the Emergency Department.  As the patient had no   irritative voiding symptoms and minimal findings on urine,  urine culture was    sent, results of which are pending.  Given the ovarian cyst, there was a   concern for torsion. A transvaginal ultrasound was ordered showing no evidence   of torsion.  Complex right ovarian cyst found. An IUD was within uterus and a   small amount of free pelvic fluid per Radiology.  The patient was  informed   of results, medicated during the course of her stay with 2 liters IV fluids,   morphine and Zofran IV oral Percocet.  Symptoms controlled and  patient   subsequently discharged in stable condition. The patient was transferred to   the care of [name] [name] by Dr. Sherlon [name] for the final portion of her ER course.    FINAL DIAGNOSES:  1. Right ovarian cyst.  2. Abdominal pain, right lower quadrant.     DISPOSITION AND TREATMENT PLAN:  Discharge instructions given.  The patient was given a copy of her ultrasound   results. Informed of results and subsequently discharged.  The patient was   given prescriptions for Phenergan, Norco. The patient is discharged home in    stable condition, with instructions to follow up with their regular doctor.    They are advised to return immediately for any worsening or symptoms of   concern. The patient was personally evaluated by myself and Dr. Sherlon [name]  who   agrees with the above assessment and plan.      ___________________  Wynelle Bourgeois [name]  Dictated By: Mearl Latin. Lockie Pares, PA-C    My signature above authenticates this document and my orders, the final  diagnosis (es), discharge prescription (s), and instructions in the [facility]   Pul[facility] record.  Nursing notes have been reviewed by the physician/mid-level provider.    If you have any  questions please contact [phone].    PB  D:12/06/2012 20:51:12  T: 12/06/2012 23:40:37  [id number]  Authenticated by Wynelle Bourgeois, [name] On 12/10/2012 07:14:21 PM

## 2012-12-07 LAB — POC URINE MACROSCOPIC
Bilirubin: NEGATIVE
Blood: NEGATIVE
Glucose: NEGATIVE mg/dl
Ketone: NEGATIVE mg/dl
Nitrites: NEGATIVE
Protein: NEGATIVE mg/dl
Specific gravity: 1.015 (ref 1.005–1.030)
Urobilinogen: 0.2 EU/dl (ref 0.0–1.0)
pH (UA): 8.5 (ref 5–9)

## 2012-12-07 LAB — METABOLIC PANEL, COMPREHENSIVE
ALT (SGPT): 37 U/L (ref 12–78)
AST (SGOT): 15 U/L (ref 15–37)
Albumin: 3.5 gm/dl (ref 3.4–5.0)
Alk. phosphatase: 92 U/L (ref 50–136)
BUN: 11 mg/dl (ref 7–25)
Bilirubin, total: 0.6 mg/dl (ref 0.2–1.0)
CO2: 25 mEq/L (ref 21–32)
Calcium: 8.4 mg/dl — ABNORMAL LOW (ref 8.5–10.1)
Chloride: 109 mEq/L — ABNORMAL HIGH (ref 98–107)
Creatinine: 0.8 mg/dl (ref 0.6–1.3)
GFR est AA: >60
GFR est non-AA: >60
Glucose: 91 mg/dl (ref 74–106)
Potassium: 3.8 mEq/L (ref 3.5–5.1)
Protein, total: 7.4 gm/dl (ref 6.4–8.2)
Sodium: 140 mEq/L (ref 136–145)

## 2012-12-07 LAB — CBC WITH AUTOMATED DIFF
BASOPHILS: 0.5 % (ref 0–3)
EOSINOPHILS: 4.9 % (ref 0–5)
HCT: 43.1 % (ref 37.0–50.0)
HGB: 14.5 gm/dl (ref 13.0–17.2)
IMMATURE GRANULOCYTES: 0.3 % (ref 0.0–3.0)
LYMPHOCYTES: 25.6 % — ABNORMAL LOW (ref 28–48)
MCH: 32 pg (ref 25.4–34.6)
MCHC: 33.6 gm/dl (ref 30.0–36.0)
MCV: 95.1 fL (ref 80.0–98.0)
MONOCYTES: 7.3 % (ref 1–13)
MPV: 10.8 fL — ABNORMAL HIGH (ref 6.0–10.0)
NEUTROPHILS: 61.4 % (ref 34–64)
NRBC: 0 (ref 0–0)
PLATELET: 245 10*3/uL (ref 140–450)
RBC: 4.53 M/uL (ref 3.60–5.20)
RDW-SD: 43.4 (ref 36.4–46.3)
WBC: 10.9 10*3/uL (ref 4.0–11.0)

## 2012-12-07 LAB — POC URINE MICROSCOPIC

## 2012-12-07 LAB — POC HCG,URINE: HCG urine, QL: NEGATIVE

## 2012-12-07 LAB — CREATININE, POC: Creatinine: 1 mg/dl (ref 0.6–1.3)

## 2012-12-07 LAB — LIPASE: Lipase: 128 U/L (ref 73–393)

## 2012-12-14 LAB — CBC W/O DIFF
HCT: 42.9 % (ref 37.0–50.0)
HGB: 13.9 gm/dl (ref 13.0–17.2)
MCH: 31 pg (ref 25.4–34.6)
MCHC: 32.4 gm/dl (ref 30.0–36.0)
MCV: 95.5 fL (ref 80.0–98.0)
MPV: 10.4 fL — ABNORMAL HIGH (ref 6.0–10.0)
PLATELET: 276 10*3/uL (ref 140–450)
RBC: 4.49 M/uL (ref 3.60–5.20)
RDW-SD: 42.2 (ref 36.4–46.3)
WBC: 7.6 10*3/uL (ref 4.0–11.0)

## 2012-12-14 LAB — HCG URINE, QL: HCG urine, QL: NEGATIVE

## 2012-12-17 NOTE — ED Provider Notes (Signed)
[facility]  EMERGENCY DEPARTMENT TREATMENT REPORT  NAME:  [name], [name]  SEX:   F  ADMIT: 12/06/2012  DOB:   1970/10/22  MR#    [id number]  ROOM:    TIME DICTATED: 10 04 AM  ACCT#  [id number]        ADDENDUM BY Wynelle Bourgeois, MD:       The patient was seen and examined with physician assistant, Anibal Henderson, I   agree with his history and examination.  A 42 year old female with right-sided   abdominal discomfort.  CT and ultrasound studies really are unremarkable   other than right ovarian cyst.  Disposition and plan as per Judie Grieve.      ___________________  Wynelle Bourgeois MD  Dictated By: Marland Kitchen     My signature above authenticates this document and my orders, the final  diagnosis (es), discharge prescription (s), and instructions in the [facility]   Pul[facility]ck record.  Nursing notes have been reviewed by the physician/mid-level provider.    If you have any questions please contact [phone].    [facility]  D:12/17/2012 10:04:59  T: 12/17/2012 11:40:58  [id number]  Authenticated by Wynelle Bourgeois, MD On 12/17/2012 02:02:08 PM

## 2012-12-21 NOTE — H&P (Signed)
[facility]  Pre-Admit History and Physical  NAME:  [name] [name]  SEX:   F  ADMIT: 12/23/2012  DOB:09/25/1970  MR#    [id number]  ROOM:    ACCT#  [id number]    I hereby certify this patient for admission based upon medical necessity as   noted below:    <cc: Antonieta Pert MD    DATE OF SERVICE:  12/21/2012.    HISTORY OF PRESENT ILLNESS:  A 42 year old gravida 3, para 2, white married female with chronic pelvic   pain, acute right lower quadrant pain, right ovarian cyst and desire for   sterilization.  Outpatient evaluation has included a transvaginal ultrasound   showing an anteverted uterus measuring 86 x 39 x 48 mm, an endometrium   measuring 3.1 mm with an IUD in situ.  Left ovary not visualized.  Right ovary   has a 24 x 19 x 23 mm cyst.  The patient is now scheduled for surgical   evaluation and treatment.    ALLERGIES:  DEMEROL GAVE SOME TYPE OF REACTION.    MEDICATIONS:  Dexmethylphenidate 10 mg oral tablet 1 p.o. b.i.d., gabapentin 300 mg 1 p.o.   t.i.d., Lexapro 20 mg  2 tablets p.o. every day, Lyrica 100 mg 1 p.o. t.i.d.,   Rizatriptan 10 mg 1 p.o. p.r.n. migraine, tizanidine 4 mg tablet 1 p.o.   b.i.d., Topamax 50 mg 1 p.o. at bedtime, trazodone 50 mg 1 p.o. at bedtime,   Tramadol 50 mg 1 p.o. q.6 hours p.r.n. pain.    PAST SURGICAL HISTORY:  Cesarean section times 1 in 2003, jaw surgery in 2004.  Chest wall biopsy in   2006, sinus surgery 2000, some type of hernia repair in 1996.    MEDICAL ILLNESSES:  Migraines with aura, celiac disease, depression, fibromyalgia and anemia.    SOCIAL HISTORY:  The patient does not smoke cigarettes or drink alcohol.  She is married.  She   is disabled.  She has a bachelor's degree.  She lives in [place].    FAMILY HISTORY:  Positive for heart disease in her father, breast cancer in her grandmother,   colon cancer in a grandmother, depression.    REVIEW OF SYSTEMS:  Noncontributory.  She had a normal menstrual period 11/11/2012 and spotting    12/12/2012.  She has a Mirena IUD for birth control.    PHYSICAL EXAMINATION:  VITAL SIGNS:  Height 60 inches, weight 150 pounds, blood pressure 110/60,   pulse 80.  HEENT:  Unremarkable.  Thyroid without masses.  NECK:  Supple.  CHEST:  Clear.  HEART:  Regular rate and rhythm without murmur, gallop or rub.  BACK:  Negative CVA tenderness.  ABDOMEN:  Soft, flat without hepatosplenomegaly, mass or tenderness.  PELVIC:  Shows normal external genitalia.  Cervix without lesions.  Uterus is   anteverted about 8 to 9 weeks' size and movable.  Adnexa negative.    Rectovaginal confirms.    IMPRESSION:  Chronic pelvic pain with acute right lower quadrant pain, right ovarian cyst   and request for sterilization.    PLAN:  Diagnostic and possible operative laparoscopy with laparoscopic tubal ligation   with Falope rings.      ___________________  Sherrian Divers III  Dictated By: .   Arnetha Courser  D:12/21/2012 15:03:12  T: 12/21/2012 15:25:13  [id number]  Authenticated by Lum Keas. Missy Sabins, M.D. On 12/23/2012 02:30:20 PM

## 2012-12-23 NOTE — Op Note (Signed)
[facility]  Operation Report  NAME:  [name], [name]  SEX:   F  DATE: 12/23/2012  DOB: 12-Jan-1971  MR#    [id number]  ROOM:  OR01  ACCT#  [id number]    cc: Thayer Jew MD    PREOPERATIVE DIAGNOSES:  Chronic pelvic pain, acute right lower quadrant pain, right ovarian cyst,   request for sterilization.      POSTOPERATIVE DIAGNOSES:  Endometriosis, omental adhesions, right ovarian cyst, thickened distal half of   the appendix.    PROCEDURE:  Laparoscopic lysis of abdominal adhesions and omental adhesions, right   salpingo-oophorectomy, appendectomy, left tubal ligation with Falope ring,   fulguration of endometriosis.    FINDINGS:  Left ovarian endometriosis, right posterior uterine serosal endometriosis,   right ovarian cyst, thickened appendix.    SURGEON:  Clementeen Hoof, M.D.      SURGICAL ASSISTANT:     One.      ESTIMATED BLOOD LOSS:  Minimal.    ANESTHESIA:  General endotracheal.    SPECIMENS:  Appendix, right tube and ovary.    INDICATIONS AND CONSENT:  This is a 42 year old female with chronic pelvic pain, right lower quadrant   pain, ovarian cyst, request for sterilization.  Outpatient evaluation included   emergency room evaluation that was unremarkable.  She gives her informed   consent.    DESCRIPTION OF PROCEDURE:  The patient was taken to the operating room, intubated under general   anesthesia without difficulty, prepped and draped in routine sterile fashion   with Hibiclens and Betadine.  Examination under anesthesia revealed an   anteverted uterus, adnexa negative.  Single-tooth tenaculum was placed on the   anterior lip of the cervix and the uterus was sounded to 8 cm.  Cervix was   dilated to a #14 Hanks dilator.  The HUMI uterine manipulator was placed and   attention was directed above.  An infraumbilical skin incision was made.  The   Veress needle was inserted.  The opening pressure was 6 mmHg.  When the   pressure reached 20 mmHg, the laparoscopic trocar was passed into the    abdominal cavity.  The findings were as noted above.  The upper abdomen was   visualized.  The liver, gallbladder, small bowel and large bowel were   unremarkable.  The appendix seemed indurated and distended at its distal half.    The left ovary had some ovarian surface endometriosis.  The posterior serosa   of the uterus had some endometriosis.  The right ovary had a cyst and some   minimal endometriosis.  The omentum was adherent to the periumbilical area   diffusely.  Three laparoscopic trocars were placed into the abdominal cavity   under direct visualization, and then the laparoscopic scissors were used to   cauterize and cut the omental adhesions.  The ureters were identified   bilaterally and the left ovarian endometriosis was cauterized.  The right tube   and ovary were cauterized and cut at the infundibulopelvic ligament well   above the ureter at the level of the ovary, and then the tube and ovary were   excised and sent to pathology.  The appendix was visualized and the   mesoappendix was cauterized and cut and then an Endoloop was placed over the   base of the appendix using a 0 Prolene.  Then the appendix was cauterized and   cut with the EnSeal device.  The abdomen was copiously irrigated.  All areas  were reinspected.  The left tube was then identified by its fimbriated end and   a Falope ring placed across the mid portion.  Twenty mL of 0.5% Marcaine was   instilled into the pelvis.  The extra fluid was evacuated.  The gas and   instruments were then removed and the incisions injected with 10 to 20 mL of   0.5% Marcaine plain.  This was after the 12 mm trocar site was closed with a 0   Vicryl suture using the disposable Carter-Thomason device.  The incisions   were closed with 4-0 Monocryl subcuticular sutures, and the HUMI uterine   manipulator was removed from the vagina.  The patient had no significant   bleeding.  The patient tolerated the procedure well and was returned to the   recovery  room with IV in place, breathing on her own and vital signs stable.    Needle and sponge counts were reported as correct.      ___________________  Sherrian Divers III  Dictated By:.   [facility]  D:12/23/2012 14:39:29  T: 12/23/2012 15:54:22  [id number]  Authenticated by Lum Keas. Missy Sabins, M.D. On 12/24/2012 07:56:34 AM

## 2012-12-23 NOTE — Op Note (Signed)
[facility]  Operation Report  NAME:  [name], [name]  SEX:   F  DATE: 12/23/2012  DOB: 03-21-1970  MR#    [id number]  ROOM:  OR01  ACCT#  [id number]    cc: Thayer Jew MD    PREOPERATIVE DIAGNOSES:  Chronic pelvic pain, acute right lower quadrant pain, right ovarian cyst,   request for sterilization.      POSTOPERATIVE DIAGNOSES:  Endometriosis, omental adhesions, right ovarian cyst, thickened distal half of   the appendix.    PROCEDURE:  Laparoscopic lysis of abdominal adhesions and omental adhesions, right   salpingo-oophorectomy, appendectomy, left tubal ligation with Falope ring,   fulguration of endometriosis.    FINDINGS:  Left ovarian endometriosis, right posterior uterine serosal endometriosis,   right ovarian cyst, thickened appendix.    SURGEON:  Clementeen Hoof, M.D.      SURGICAL ASSISTANT:     One.      ESTIMATED BLOOD LOSS:  Minimal.    ANESTHESIA:  General endotracheal.    SPECIMENS:  Appendix, right tube and ovary.    INDICATIONS AND CONSENT:  This is a 42 year old female with chronic pelvic pain, right lower quadrant   pain, ovarian cyst, request for sterilization.  Outpatient evaluation included   emergency room evaluation that was unremarkable.  She gives her informed   consent.    DESCRIPTION OF PROCEDURE:  The patient was taken to the operating room, intubated under general   anesthesia without difficulty, prepped and draped in routine sterile fashion   with Hibiclens and Betadine.  Examination under anesthesia revealed an   anteverted uterus, adnexa negative.  Single-tooth tenaculum was placed on the   anterior lip of the cervix and the uterus was sounded to 8 cm.  Cervix was   dilated to a #14 Hanks dilator.  The HUMI uterine manipulator was placed and   attention was directed above.  An infraumbilical skin incision was made.  The   Veress needle was inserted.  The opening pressure was 6 mmHg.  When the   pressure reached 20 mmHg, the laparoscopic trocar was passed into the    abdominal cavity.  The findings were as noted above.  The upper abdomen was   visualized.  The liver, gallbladder, small bowel and large bowel were   unremarkable.  The appendix seemed indurated and distended at its distal half.    The left ovary had some ovarian surface endometriosis.  The posterior serosa   of the uterus had some endometriosis.  The right ovary had a cyst and some   minimal endometriosis.  The omentum was adherent to the periumbilical area   diffusely.  Three laparoscopic trocars were placed into the abdominal cavity   under direct visualization, and then the laparoscopic scissors were used to   cauterize and cut the omental adhesions.  The ureters were identified   bilaterally and the left ovarian endometriosis was cauterized.  The right tube   and ovary were cauterized and cut at the infundibulopelvic ligament well   above the ureter at the level of the ovary, and then the tube and ovary were   excised and sent to pathology.  The appendix was visualized and the   mesoappendix was cauterized and cut and then an Endoloop was placed over the   base of the appendix using a 0 Prolene.  Then the appendix was cauterized and   cut with the EnSeal device.  The abdomen was copiously irrigated.  All areas  were reinspected.  The left tube was then identified by its fimbriated end and   a Falope ring placed across the mid portion.  Twenty mL of 0.5% Marcaine was   instilled into the pelvis.  The extra fluid was evacuated.  The gas and   instruments were then removed and the incisions injected with 10 to 20 mL of   0.5% Marcaine plain.  This was after the 12 mm trocar site was closed with a 0   Vicryl suture using the disposable Carter-Thomason device.  The incisions   were closed with 4-0 Monocryl subcuticular sutures, and the HUMI uterine   manipulator was removed from the vagina.  The patient had no significant   bleeding.  The patient tolerated the procedure well and was returned to the    recovery room with IV in place, breathing on her own and vital signs stable.    Needle and sponge counts were reported as correct.      ___________________  Sherrian Divers III  Dictated By:.   [facility]  D:12/23/2012 14:39:29  T: 12/23/2012 15:54:22  [id number]  Authenticated by Lum Keas. [name] [name], M.D. On 12/24/2012 07:56:34 AM

## 2012-12-25 LAB — BLOOD TYPE, (ABO+RH)
ABO/Rh(D): A POS
ABO/Rh: A POS

## 2012-12-25 LAB — URINALYSIS W/ RFLX MICROSCOPIC
Bilirubin: NEGATIVE
Glucose: NEGATIVE mg/dl
Ketone: NEGATIVE mg/dl
Leukocyte Esterase: NEGATIVE
Nitrites: NEGATIVE
Protein: NEGATIVE mg/dl
Specific gravity: 1.01 (ref 1.005–1.030)
Urobilinogen: 1 mg/dl (ref 0.0–1.0)
pH (UA): 7 (ref 5.0–9.0)

## 2012-12-25 LAB — GLUCOSE, POC: Glucose (POC): 177 mg/dL — ABNORMAL HIGH (ref 65–105)

## 2012-12-25 LAB — CBC WITH AUTOMATED DIFF
BASOPHILS: 0.4 % (ref 0–3)
EOSINOPHILS: 0.9 % (ref 0–5)
HCT: 34.2 % — ABNORMAL LOW (ref 37.0–50.0)
HGB: 11.2 gm/dl — ABNORMAL LOW (ref 13.0–17.2)
IMMATURE GRANULOCYTES: 0.7 % (ref 0.0–3.0)
LYMPHOCYTES: 7.9 % — ABNORMAL LOW (ref 28–48)
MCH: 31.6 pg (ref 25.4–34.6)
MCHC: 32.7 gm/dl (ref 30.0–36.0)
MCV: 96.6 fL (ref 80.0–98.0)
MONOCYTES: 5 % (ref 1–13)
MPV: 10.9 fL — ABNORMAL HIGH (ref 6.0–10.0)
NEUTROPHILS: 85.1 % — ABNORMAL HIGH (ref 34–64)
NRBC: 0 (ref 0–0)
PLATELET: 238 10*3/uL (ref 140–450)
RBC: 3.54 M/uL — ABNORMAL LOW (ref 3.60–5.20)
RDW-SD: 45.1 (ref 36.4–46.3)
WBC: 16.9 10*3/uL — ABNORMAL HIGH (ref 4.0–11.0)

## 2012-12-25 LAB — METABOLIC PANEL, COMPREHENSIVE
ALT (SGPT): 49 U/L (ref 12–78)
AST (SGOT): 20 U/L (ref 15–37)
Albumin: 2.6 gm/dl — ABNORMAL LOW (ref 3.4–5.0)
Alk. phosphatase: 74 U/L (ref 45–117)
BUN: 10 mg/dl (ref 7–25)
Bilirubin, total: 1 mg/dl (ref 0.2–1.0)
CO2: 23 mEq/L (ref 21–32)
Calcium: 8.1 mg/dl — ABNORMAL LOW (ref 8.5–10.1)
Chloride: 111 mEq/L — ABNORMAL HIGH (ref 98–107)
Creatinine: 1.1 mg/dl (ref 0.6–1.3)
GFR est AA: >60
GFR est non-AA: 57
Glucose: 159 mg/dl — ABNORMAL HIGH (ref 74–106)
Potassium: 3.6 mEq/L (ref 3.5–5.1)
Protein, total: 6.2 gm/dl — ABNORMAL LOW (ref 6.4–8.2)
Sodium: 141 mEq/L (ref 136–145)

## 2012-12-25 LAB — AMYLASE: Amylase, Total: 33 U/L (ref 25–115)

## 2012-12-25 LAB — POC URINE MICROSCOPIC

## 2012-12-25 LAB — LIPASE: Lipase: 73 U/L (ref 73–393)

## 2012-12-25 LAB — ANTIBODY SCREEN: Antibody screen: NEGATIVE

## 2012-12-25 NOTE — H&P (Signed)
[facility]  Stat History and Physical  NAME:  [name], [name]  SEX:   F  ADMIT: 12/25/2012  DOB:Jun 12, 1970  MR#    [id number]  ROOM:  4210  ACCT#  [id number]    I hereby certify this patient for admission based upon medical necessity as   noted below:    <cc: Thayer Jew MD    REPORT CONFIRMATION #:  [id number]    HISTORY OF PRESENT ILLNESS:  This is a 42 year old white married female who underwent a laparoscopic lysis   of adhesions, right salpingo-oophorectomy, appendectomy, left tubal ligation   and fulguration of endometriosis on 12/23/2012, which was Wednesday afternoon   2 days ago.  Last night she awoke with a temperature of 103.8 degrees and was   complaining of feeling achy, headache, knee pain, shoulder pain and right   abdominal incisional pain.  She denies nausea, vomiting, dysuria and had a   normal bowel movement yesterday.  A 12-point review of systems is negative.      PAST MEDICAL HISTORY:    MEDICAL ILLNESSES:  Migraines with aura, celiac disease, depression, fibromyalgia and anemia.    PAST SURGICAL HISTORY:  Cesarean section in 2003, some type of jaw surgery in 2004, chest wall biopsy   in 2006, sinus surgery in 2000, hernia repair in 1996 and the surgery noted   above laparoscopically on 12/23/2012.    MEDICATIONS:  Dexmethylphenidate 10 mg 1 tablet p.o. b.i.d., gabapentin 300 mg 1 p.o.   t.i.d., Lexapro 20 mg 2 tablets p.o. every day, Lyrica 100 mg p.o. t.i.d.,   Percocet 5/325 1 to 2 p.o. q.6 hours p.r.n. pain, rizatriptan 10 mg 1 p.o.   p.r.n. migraine, tizanidine 4 mg 1 capsule p.o. b.i.d., Topamax 50 mg 1 p.o.   at bedtime, tramadol 50 mg 1 p.o. q.6 hours p.r.n. pain prior to her surgery,   trazodone 50 mg tablet 1 p.o. at bedtime.      REVIEW OF SYSTEMS:    Noncontributory.    SOCIAL HISTORY:  She does not smoke cigarettes or drink alcohol.  She is disabled.  She lives   in [place].  She has a bachelor's degree.    FAMILY HISTORY:   Positive for heart disease in her father, breast cancer in a grandmother and   colon cancer in a grandmother, and depression.    PHYSICAL EXAMINATION:    VITAL SIGNS:  Weight is 150 pounds, blood pressure 116/76, pulse 100,   temperature is 100.9.    GENERAL:  She appears in mild distress which worsens when attempting to lie   down.  HEENT:  Unremarkable.  Thyroid without masses.  NECK:  Supple.  CHEST:  Clear.  HEART:  Regular rate and rhythm without murmur, gallop or rub, pulse rate 100.  BACK:  Negative CVA tenderness.  ABDOMEN:  Soft, flat and diffusely tender with possible rebound.  Bowel sounds   decreased.  No hepatosplenomegaly or masses.  The incisions are well healed   and clean.  PELVIC:  Normal external genitalia.  Cervix without lesion.  There is a small   amount of blood in the vagina.  Uterus is anteverted, anteflexed, tender,   without masses.  Adnexa negative.  Rectovaginal confirms.    IMPRESSION:  Postoperative fever, abdominal tenderness.    PLAN:  Admit to [facility] for evaluation and treatment   including lab work and radiologic evaluation of her abdomen.      ___________________  Sherrian Divers  III  Dictated By: .   MLT  D:12/25/2012 14:28:43  T: 12/25/2012 14:58:16  [id number]  Authenticated by Lum Keas. [name] [name], M.D. On 12/26/2012 09:36:28 AM

## 2012-12-26 LAB — CBC WITH AUTOMATED DIFF
BASOPHILS: 0.3 % (ref 0–3)
EOSINOPHILS: 2.8 % (ref 0–5)
HCT: 32.3 % — ABNORMAL LOW (ref 37.0–50.0)
HGB: 10.7 gm/dl — ABNORMAL LOW (ref 13.0–17.2)
IMMATURE GRANULOCYTES: 0.5 % (ref 0.0–3.0)
LYMPHOCYTES: 10.2 % — ABNORMAL LOW (ref 28–48)
MCH: 31.6 pg (ref 25.4–34.6)
MCHC: 33.1 gm/dl (ref 30.0–36.0)
MCV: 95.3 fL (ref 80.0–98.0)
MONOCYTES: 6.7 % (ref 1–13)
MPV: 11.3 fL — ABNORMAL HIGH (ref 6.0–10.0)
NEUTROPHILS: 79.5 % — ABNORMAL HIGH (ref 34–64)
NRBC: 0 (ref 0–0)
PLATELET: 250 10*3/uL (ref 140–450)
RBC: 3.39 M/uL — ABNORMAL LOW (ref 3.60–5.20)
RDW-SD: 43.5 (ref 36.4–46.3)
WBC: 15.5 10*3/uL — ABNORMAL HIGH (ref 4.0–11.0)

## 2012-12-27 LAB — METABOLIC PANEL, COMPREHENSIVE
ALT (SGPT): 26 U/L (ref 12–78)
AST (SGOT): 9 U/L — ABNORMAL LOW (ref 15–37)
Albumin: 2.4 gm/dl — ABNORMAL LOW (ref 3.4–5.0)
Alk. phosphatase: 69 U/L (ref 45–117)
BUN: 6 mg/dl — ABNORMAL LOW (ref 7–25)
Bilirubin, total: 0.5 mg/dl (ref 0.2–1.0)
CO2: 25 mEq/L (ref 21–32)
Calcium: 8.4 mg/dl — ABNORMAL LOW (ref 8.5–10.1)
Chloride: 112 mEq/L — ABNORMAL HIGH (ref 98–107)
Creatinine: 0.9 mg/dl (ref 0.6–1.3)
GFR est AA: >60
GFR est non-AA: >60
Glucose: 95 mg/dl (ref 74–106)
Potassium: 4.5 mEq/L (ref 3.5–5.1)
Protein, total: 6.2 gm/dl — ABNORMAL LOW (ref 6.4–8.2)
Sodium: 142 mEq/L (ref 136–145)

## 2012-12-27 LAB — CBC WITH AUTOMATED DIFF
BASOPHILS: 0.3 % (ref 0–3)
EOSINOPHILS: 7.9 % — ABNORMAL HIGH (ref 0–5)
HCT: 32.7 % — ABNORMAL LOW (ref 37.0–50.0)
HGB: 10.8 gm/dl — ABNORMAL LOW (ref 13.0–17.2)
IMMATURE GRANULOCYTES: 0.3 % (ref 0.0–3.0)
LYMPHOCYTES: 18.7 % — ABNORMAL LOW (ref 28–48)
MCH: 31.7 pg (ref 25.4–34.6)
MCHC: 33 gm/dl (ref 30.0–36.0)
MCV: 95.9 fL (ref 80.0–98.0)
MONOCYTES: 8.7 % (ref 1–13)
MPV: 10.7 fL — ABNORMAL HIGH (ref 6.0–10.0)
NEUTROPHILS: 64.1 % — ABNORMAL HIGH (ref 34–64)
NRBC: 0 (ref 0–0)
PLATELET: 265 10*3/uL (ref 140–450)
RBC: 3.41 M/uL — ABNORMAL LOW (ref 3.60–5.20)
RDW-SD: 44.1 (ref 36.4–46.3)
WBC: 9 10*3/uL (ref 4.0–11.0)

## 2012-12-27 LAB — C. DIFFICILE/EPI PCR: C. diff toxin by PCR: NEGATIVE

## 2012-12-28 LAB — CBC WITH AUTOMATED DIFF
BASOPHILS: 0.5 % (ref 0–3)
EOSINOPHILS: 8.8 % — ABNORMAL HIGH (ref 0–5)
HCT: 35.2 % — ABNORMAL LOW (ref 37.0–50.0)
HGB: 11.5 gm/dl — ABNORMAL LOW (ref 13.0–17.2)
IMMATURE GRANULOCYTES: 0.4 % (ref 0.0–3.0)
LYMPHOCYTES: 22.3 % — ABNORMAL LOW (ref 28–48)
MCH: 31.1 pg (ref 25.4–34.6)
MCHC: 32.7 gm/dl (ref 30.0–36.0)
MCV: 95.1 fL (ref 80.0–98.0)
MONOCYTES: 8.5 % (ref 1–13)
MPV: 10.9 fL — ABNORMAL HIGH (ref 6.0–10.0)
NEUTROPHILS: 59.5 % (ref 34–64)
NRBC: 0 (ref 0–0)
PLATELET: 301 10*3/uL (ref 140–450)
RBC: 3.7 M/uL (ref 3.60–5.20)
RDW-SD: 43.4 (ref 36.4–46.3)
WBC: 7.4 10*3/uL (ref 4.0–11.0)

## 2013-01-06 LAB — METABOLIC PANEL, COMPREHENSIVE
ALT (SGPT): 126 U/L — ABNORMAL HIGH (ref 12–78)
AST (SGOT): 34 U/L (ref 15–37)
Albumin: 3.3 gm/dl — ABNORMAL LOW (ref 3.4–5.0)
Alk. phosphatase: 94 U/L (ref 45–117)
BUN: 11 mg/dl (ref 7–25)
Bilirubin, total: 0.6 mg/dl (ref 0.2–1.0)
CO2: 24 mEq/L (ref 21–32)
Calcium: 9.4 mg/dl (ref 8.5–10.1)
Chloride: 105 mEq/L (ref 98–107)
Creatinine: 1 mg/dl (ref 0.6–1.3)
GFR est AA: >60
GFR est non-AA: >60
Glucose: 89 mg/dl (ref 74–106)
Potassium: 4.2 mEq/L (ref 3.5–5.1)
Protein, total: 7.6 gm/dl (ref 6.4–8.2)
Sodium: 137 mEq/L (ref 136–145)

## 2013-01-06 LAB — CBC WITH AUTOMATED DIFF
BASOPHILS: 0.3 % (ref 0–3)
EOSINOPHILS: 0.8 % (ref 0–5)
HCT: 39.3 % (ref 37.0–50.0)
HGB: 12.8 gm/dl — ABNORMAL LOW (ref 13.0–17.2)
IMMATURE GRANULOCYTES: 0.5 % (ref 0.0–3.0)
LYMPHOCYTES: 8 % — ABNORMAL LOW (ref 28–48)
MCH: 30.9 pg (ref 25.4–34.6)
MCHC: 32.6 gm/dl (ref 30.0–36.0)
MCV: 94.9 fL (ref 80.0–98.0)
MONOCYTES: 5.5 % (ref 1–13)
MPV: 10.5 fL — ABNORMAL HIGH (ref 6.0–10.0)
NEUTROPHILS: 84.9 % — ABNORMAL HIGH (ref 34–64)
NRBC: 0 (ref 0–0)
PLATELET: 444 10*3/uL (ref 140–450)
RBC: 4.14 M/uL (ref 3.60–5.20)
RDW-SD: 43.5 (ref 36.4–46.3)
WBC: 19.5 10*3/uL — ABNORMAL HIGH (ref 4.0–11.0)

## 2013-01-11 LAB — CBC WITH AUTOMATED DIFF
BASOPHILS: 0.6 % (ref 0–3)
EOSINOPHILS: 2.1 % (ref 0–5)
HCT: 41 % (ref 37.0–50.0)
HGB: 13.5 gm/dl (ref 13.0–17.2)
IMMATURE GRANULOCYTES: 0.3 % (ref 0.0–3.0)
LYMPHOCYTES: 29.6 % (ref 28–48)
MCH: 31.5 pg (ref 25.4–34.6)
MCHC: 32.9 gm/dl (ref 30.0–36.0)
MCV: 95.6 fL (ref 80.0–98.0)
MONOCYTES: 6.9 % (ref 1–13)
MPV: 10.5 fL — ABNORMAL HIGH (ref 6.0–10.0)
NEUTROPHILS: 60.5 % (ref 34–64)
NRBC: 0 (ref 0–0)
PLATELET: 438 10*3/uL (ref 140–450)
RBC: 4.29 M/uL (ref 3.60–5.20)
RDW-SD: 44.1 (ref 36.4–46.3)
WBC: 6.8 10*3/uL (ref 4.0–11.0)

## 2013-01-18 LAB — CBC WITH AUTOMATED DIFF
BASOPHILS: 0.8 % (ref 0–3)
EOSINOPHILS: 5.1 % — ABNORMAL HIGH (ref 0–5)
HCT: 41.3 % (ref 37.0–50.0)
HGB: 13.4 gm/dl (ref 13.0–17.2)
IMMATURE GRANULOCYTES: 0.1 % (ref 0.0–3.0)
LYMPHOCYTES: 33 % (ref 28–48)
MCH: 30.5 pg (ref 25.4–34.6)
MCHC: 32.4 gm/dl (ref 30.0–36.0)
MCV: 94.1 fL (ref 80.0–98.0)
MONOCYTES: 6.5 % (ref 1–13)
MPV: 10.7 fL — ABNORMAL HIGH (ref 6.0–10.0)
NEUTROPHILS: 54.5 % (ref 34–64)
NRBC: 0 (ref 0–0)
PLATELET: 333 10*3/uL (ref 140–450)
RBC: 4.39 M/uL (ref 3.60–5.20)
RDW-SD: 43 (ref 36.4–46.3)
WBC: 7.4 10*3/uL (ref 4.0–11.0)

## 2013-02-02 LAB — CBC WITH AUTOMATED DIFF
BASOPHILS: 0.6 % (ref 0–3)
EOSINOPHILS: 1.4 % (ref 0–5)
HCT: 42.1 % (ref 37.0–50.0)
HGB: 13.6 gm/dl (ref 13.0–17.2)
IMMATURE GRANULOCYTES: 0.3 % (ref 0.0–3.0)
LYMPHOCYTES: 29.1 % (ref 28–48)
MCH: 30 pg (ref 25.4–34.6)
MCHC: 32.3 gm/dl (ref 30.0–36.0)
MCV: 92.9 fL (ref 80.0–98.0)
MONOCYTES: 5.7 % (ref 1–13)
MPV: 10.8 fL — ABNORMAL HIGH (ref 6.0–10.0)
NEUTROPHILS: 62.9 % (ref 34–64)
NRBC: 0 (ref 0–0)
PLATELET: 284 10*3/uL (ref 140–450)
RBC: 4.53 M/uL (ref 3.60–5.20)
RDW-SD: 43.3 (ref 36.4–46.3)
WBC: 7.7 10*3/uL (ref 4.0–11.0)

## 2013-02-02 LAB — URINALYSIS W/ RFLX MICROSCOPIC
Bilirubin: NEGATIVE
Glucose: NEGATIVE mg/dl
Ketone: NEGATIVE mg/dl
Leukocyte Esterase: NEGATIVE
Nitrites: NEGATIVE
Protein: NEGATIVE mg/dl
Specific gravity: 1.025 (ref 1.005–1.030)
Urobilinogen: 0.2 mg/dl (ref 0.0–1.0)
pH (UA): 6 (ref 5.0–9.0)

## 2013-02-02 LAB — METABOLIC PANEL, COMPREHENSIVE
ALT (SGPT): 35 U/L (ref 12–78)
AST (SGOT): 18 U/L (ref 15–37)
Albumin: 3.9 gm/dl (ref 3.4–5.0)
Alk. phosphatase: 80 U/L (ref 45–117)
BUN: 16 mg/dl (ref 7–25)
Bilirubin, total: 0.6 mg/dl (ref 0.2–1.0)
CO2: 25 mEq/L (ref 21–32)
Calcium: 9 mg/dl (ref 8.5–10.1)
Chloride: 108 mEq/L — ABNORMAL HIGH (ref 98–107)
Creatinine: 1.1 mg/dl (ref 0.6–1.3)
GFR est AA: >60
GFR est non-AA: 57
Glucose: 79 mg/dl (ref 74–106)
Potassium: 3.9 mEq/L (ref 3.5–5.1)
Protein, total: 7.6 gm/dl (ref 6.4–8.2)
Sodium: 138 mEq/L (ref 136–145)

## 2013-02-02 LAB — POC URINE MICROSCOPIC

## 2013-10-14 LAB — METABOLIC PANEL, COMPREHENSIVE
ALT (SGPT): 21 U/L (ref 12–78)
AST (SGOT): 11 U/L — ABNORMAL LOW (ref 15–37)
Albumin: 3.5 gm/dl (ref 3.4–5.0)
Alk. phosphatase: 115 U/L (ref 45–117)
BUN: 9 mg/dl (ref 7–25)
Bilirubin, total: 0.6 mg/dl (ref 0.2–1.0)
CO2: 21 mEq/L (ref 21–32)
Calcium: 8.2 mg/dl — ABNORMAL LOW (ref 8.5–10.1)
Chloride: 111 mEq/L — ABNORMAL HIGH (ref 98–107)
Creatinine: 1.1 mg/dl (ref 0.6–1.3)
GFR est AA: >60
GFR est non-AA: 57
Glucose: 81 mg/dl (ref 74–106)
Potassium: 3.8 mEq/L (ref 3.5–5.1)
Protein, total: 7 gm/dl (ref 6.4–8.2)
Sodium: 141 mEq/L (ref 136–145)

## 2013-10-14 LAB — CBC WITH AUTOMATED DIFF
BASOPHILS: 0.6 % (ref 0–3)
EOSINOPHILS: 2.7 % (ref 0–5)
HCT: 41.9 % (ref 37.0–50.0)
HGB: 14 gm/dl (ref 13.0–17.2)
IMMATURE GRANULOCYTES: 0.1 % (ref 0.0–3.0)
LYMPHOCYTES: 27.1 % — ABNORMAL LOW (ref 28–48)
MCH: 31.4 pg (ref 25.4–34.6)
MCHC: 33.4 gm/dl (ref 30.0–36.0)
MCV: 93.9 fL (ref 80.0–98.0)
MONOCYTES: 7.4 % (ref 1–13)
MPV: 10.8 fL — ABNORMAL HIGH (ref 6.0–10.0)
NEUTROPHILS: 62.1 % (ref 34–64)
NRBC: 0 (ref 0–0)
PLATELET: 268 10*3/uL (ref 140–450)
RBC: 4.46 M/uL (ref 3.60–5.20)
RDW-SD: 43.3 (ref 36.4–46.3)
WBC: 8.2 10*3/uL (ref 4.0–11.0)

## 2013-10-14 LAB — POC URINE MACROSCOPIC
Bilirubin: NEGATIVE
Blood: NEGATIVE
Glucose: NEGATIVE mg/dl
Ketone: NEGATIVE mg/dl
Leukocyte Esterase: NEGATIVE
Nitrites: NEGATIVE
Protein: NEGATIVE mg/dl
Specific gravity: 1.01 (ref 1.005–1.030)
Urobilinogen: 0.2 EU/dl (ref 0.0–1.0)
pH (UA): 7 (ref 5–9)

## 2013-10-14 LAB — LIPASE: Lipase: 151 U/L (ref 73–393)

## 2013-10-14 NOTE — ED Provider Notes (Addendum)
[facility]  EMERGENCY DEPARTMENT TREATMENT REPORT  NAME:  [name] [name]  SEX:   F  ADMIT: 10/14/2013  DOB:   06/25/1970  MR#    [id number]  ROOM:    TIME DICTATED: 06 33 PM  ACCT#  [id number]        CHIEF COMPLAINT:  Abdominal pain.    HISTORY OF PRESENT ILLNESS:  A 43 year old female who states that since November she has had pretty much  constant right upper quadrant abdominal pain.  It will get better and worse  and she will have some moments without pain, but for the most part it has been  there.  She states she has had an ultrasound of her right upper quadrant  ordered by Dr. Janeann Forehand done yesterday.  She does not know the results of that  study yet.  She states this all started when she had her right ovary taken  out.  Afterwards she had a urinary tract infection with ESBL.  She has been  treated for that a couple times and was finally cleared.  She has seen Dr.  Ma Rings in followup, who feels that this is not related to her surgery.  She has  again seen Dr. Janeann Forehand.  It hurts to take a deep breath, it hurts to sit up  because she feels \\crunched.\\  She rates her pain as a 7 out of 10.  She  has been taking Tylenol without relief.  Reportedly, she has had elevated  liver enzymes recently, which is what prompted her right upper quadrant  ultrasound.  The pain is in the right upper quadrant, does not otherwise  radiate.  It hurts to touch, it hurts to move, and it hurts to take a deep  breath.    REVIEW OF SYSTEMS:  CONSTITUTIONAL:  No fevers.  EYES:  No visual symptoms.  ENT:  No sore throat.  RESPIRATORY:  No cough or shortness of breath.  CARDIOVASCULAR:  No chest pain.  GASTROINTESTINAL:  No vomiting, but she is very nauseated and that has been  constant.  No diarrhea.  GENITOURINARY:  No dysuria.  INTEGUMENTARY:  No rashes.  NEUROLOGIC:  No tingling, numbness or weakness throughout.    PAST MEDICAL HISTORY:  Significant for celiac disease, migraines, fibromyalgia. She had endoscopy   that showed ulcers.     SOCIAL HISTORY:  No alcohol, tobacco or drug abuse.    MEDICATIONS:  Please see [facility].    ALLERGIES:  DEMEROL.    PHYSICAL EXAMINATION:  VITAL SIGNS:  Blood pressure 127/79, pulse 65, respiratory rate 16,  temperature 98.1, O2 sat 96% on room air.  GENERAL APPEARANCE:  The patient appears well developed and well nourished.  Appearance and behavior are age and situation appropriate.   HEENT: Eyes:  Conjunctivae clear, lids normal.  Pupils equal, symmetrical, and  normally reactive. There is no scleral icterus.  Mouth/Throat:  Surfaces of  the pharynx, palate, and tongue are pink, moist, and without lesions.   RESPIRATORY:  Clear and equal breath sounds.  No respiratory distress,  tachypnea, or accessory muscle use.   CARDIOVASCULAR:  Heart regular, without murmurs, gallops, rubs, or thrills.   GASTROINTESTINAL:  She has tenderness in the extreme right upper quadrant,  none in the epigastrium.  There is no rash in this region.  There is no left  upper quadrant, left lower quadrant or right lower quadrant tenderness.  SKIN:  Warm and dry. Her skin is without jaundice.  EXTREMITIES:  Without edema.  NEUROLOGIC:  She is intact throughout.  PSYCHIATRIC:  She is somewhat anxious.    INITIAL ASSESSMENT:  A patient with right upper quadrant pain that is chronic in nature.  Given  that it has evaded diagnosis, we will likely proceed with CT imaging if her  ultrasound was nonrevealing.  This is a new problem for this patient. This is  an acute exacerbation of a chronic condition for this patient.     CONTINUATION BY Dixie Dials, MD:    RESULTS:    CT of the abdomen and pelvis shows no acute intracranial or intrapelvic  process. Pregnancy was negative.  Urinalysis negative for signs of infection.  CMP:  Chloride 111, calcium 8.2, AST of 11 otherwise normal.  Lipase was  normal.  CBC was normal.    MEDICAL DECISION-MAKING/HOSPITAL COURSE:   I reviewed the patient's ultrasound from yesterday, which was normal.  She  received IV Benadryl, Phenergan, Dilaudid, Zofran and IV fluids.  She looks  well, over time.  She has had no instability in her status.  I have indicated  that, perhaps, there is an element of neuropathic pain and, perhaps,  consideration of MRI testing of the spine would be appropriate.  This may  represent worsening peptic ulcer disease.  I believe, at this point, she can  be safely discharged home to follow up with Dr. Gibson Ramp and Dr. Janeann Forehand.  Prescriptions for Zofran and Norco.    DIAGNOSES:  1.  Acute-on-chronic right upper quadrant abdominal pain.  2.  Nausea.  3.  Peptic ulcer disease.      ___________________  [name] Pellant MD  Dictated By: [name] [name]     My signature above authenticates this document and my orders, the final  diagnosis (es), discharge prescription (s), and instructions in the [facility] record.  Nursing notes have been reviewed by the physician/mid-level provider.    If you have any questions please contact [phone].    JM  D:10/14/2013 18:33:39  T: 10/14/2013 21:58:42  [id number]  Electronically Authenticated by:  Carlis Stable. Carmela Hurt, M.D. On 11/02/2013 08:45 PM EDT

## 2013-10-15 LAB — POC HCG,URINE: HCG urine, QL: NEGATIVE

## 2014-05-10 ENCOUNTER — Encounter

## 2014-05-13 ENCOUNTER — Inpatient Hospital Stay: Payer: MEDICARE | Attending: Geriatric Medicine | Primary: Geriatric Medicine

## 2014-05-16 ENCOUNTER — Inpatient Hospital Stay: Admit: 2014-05-16 | Payer: MEDICARE | Attending: Geriatric Medicine | Primary: Geriatric Medicine

## 2014-05-16 DIAGNOSIS — R9431 Abnormal electrocardiogram [ECG] [EKG]: Secondary | ICD-10-CM

## 2014-05-16 LAB — NUCLEAR STRESS TEST
Max. Diastolic BP: 67 mmHg
Max. Heart rate: 107 {beats}/min
Max. Systolic BP: 106 mmHg
Overall BP response to exercise: NORMAL
Peak Ex METs: 1 METS

## 2014-05-16 MED ORDER — SODIUM CHLORIDE 0.9 % IJ SYRG
Freq: Once | INTRAMUSCULAR | Status: AC
Start: 2014-05-16 — End: 2014-05-16
  Administered 2014-05-16: 14:00:00 via INTRAVENOUS

## 2014-05-16 MED ORDER — AMINOPHYLLINE 250 MG/10 ML IV
250 mg/10 mL | Freq: Once | INTRAVENOUS | Status: AC
Start: 2014-05-16 — End: 2014-05-16
  Administered 2014-05-16: 14:00:00 via INTRAVENOUS

## 2014-05-16 MED ORDER — TECHNETIUM TC 99M SESTAMIBI - CARDIOLITE
Freq: Once | Status: AC
Start: 2014-05-16 — End: 2014-05-16
  Administered 2014-05-16: 12:00:00 via INTRAVENOUS

## 2014-05-16 MED ORDER — TECHNETIUM TC 99M SESTAMIBI - CARDIOLITE
Freq: Once | Status: AC
Start: 2014-05-16 — End: 2014-05-16
  Administered 2014-05-16: 13:00:00 via INTRAVENOUS

## 2014-05-16 MED ORDER — REGADENOSON 0.4 MG/5 ML IV SYRINGE
0.4 mg/5 mL | Freq: Once | INTRAVENOUS | Status: AC
Start: 2014-05-16 — End: 2014-05-16
  Administered 2014-05-16: 14:00:00 via INTRAVENOUS

## 2014-05-16 MED FILL — TECHNETIUM TC 99M SESTAMIBI - CARDIOLITE: Qty: 10

## 2014-05-16 MED FILL — TECHNETIUM TC 99M SESTAMIBI - CARDIOLITE: Qty: 30

## 2014-05-16 NOTE — Procedures (Signed)
[facility]  Nuclear Report  NAME:  [name] [name], [name] [name]  SEX:   F  DATE: 05/16/2014  DOB:   [birth year]  MR# [id number]  ROOM:    REFERRING:   BILLING#  [id number]  Olevia Bowens MD        Reviewed with Dr. Noel Gerold.    INDICATION FOR STUDY:  Preoperative cardiac risk assessment, oral surgery, family history of   premature coronary artery disease in father and personal hyperlipidemia.    PROTOCOL:  11 mCi of 67m technetium sestamibi was injected at rest followed by R-wave   gated SPECT myocardial scintigraphy and then Lexiscan 0.4 mg injected   intravenously and at peak hyperemia an additional 30.5 mCi 70m technetium   sestamibi injected followed by repeat gated SPECT myocardial scintigraphy.    FINDINGS:  SPECT sestamibi scintigraphy displays normal uniform uptake of radiotracer   throughout the myocardium following Lexiscan-induced hyperemia also on   preceding rest imaging, no evidence to suggest redistribution.  R-wave gated   cineloop resting left ventricular sestamibi scintigraphy displays small left   ventricular chamber with normal contractility, calculated ejection fraction of   66%.  Review of raw data and attenuation corrected images display no   significant artifacts.    OVERALL IMPRESSION:  Lexiscan gated SPECT sestamibi stress test with small left ventricle, ejection   fraction of 66%.  No evidence for scar or ischemia.  This is a low-risk   study.      ___________________  Olevia Bowens MD  Dictated By: .   JMB  D:05/16/2014 12:49:55  T: 05/16/2014 14:02:57  [id number]

## 2014-05-16 NOTE — Procedures (Signed)
[facility]  Nuclear Report  NAME:  [name] [name]  SEX:   F  DATE: 05/16/2014  DOB:   12/06/1970  MR# [id number]  ROOM:    REFERRING:   BILLING#  [id number]  [name] [name] [name] Deshauna Cayson MD        Reviewed with Dr. Cohen.    INDICATION FOR STUDY:  Preoperative cardiac risk assessment, oral surgery, family history of   premature coronary artery disease in father and personal hyperlipidemia.    PROTOCOL:  11 mCi of 99m technetium sestamibi was injected at rest followed by R-wave   gated SPECT myocardial scintigraphy and then Lexiscan 0.4 mg injected   intravenously and at peak hyperemia an additional 30.5 mCi 99m technetium   sestamibi injected followed by repeat gated SPECT myocardial scintigraphy.    FINDINGS:  SPECT sestamibi scintigraphy displays normal uniform uptake of radiotracer   throughout the myocardium following Lexiscan-induced hyperemia also on   preceding rest imaging, no evidence to suggest redistribution.  R-wave gated   cineloop resting left ventricular sestamibi scintigraphy displays small left   ventricular chamber with normal contractility, calculated ejection fraction of   66%.  Review of raw data and attenuation corrected images display no   significant artifacts.    OVERALL IMPRESSION:  Lexiscan gated SPECT sestamibi stress test with small left ventricle, ejection   fraction of 66%.  No evidence for scar or ischemia.  This is a low-risk   study.      ___________________  [name] [name] [name] [name] Kapaun MD  Dictated By: .   JMB  D:05/16/2014 12:49:55  T: 05/16/2014 14:02:57  [id number]

## 2014-05-20 ENCOUNTER — Encounter

## 2014-05-22 NOTE — Consults (Unsigned)
[facility]  CONSULTATION REPORT  NAME:  [name] [name], [name] [name]  SEX:   F  ADMIT: 05/16/2014  DATE OF CONSULT: 05/22/2014  REFERRING PHYSICIAN: Gaetana Michaelis Avelardo Reesman  DOB: 1970-10-04  MR#    [id number]  ROOM:    ACCT#  [id number]        MEDICAL CONSULTATION     REFERRING PHYSICIAN:  Dr. Ambrose Pancoast.    This 44 year old married white female is going to have a palatal lesion   removed by Dr. Ambrose Pancoast.    The patient had seen me in preop consultation on 05/10/14.  She has an   interesting past history.    She has undergone laparoscopic lysis of   adhesions, right salpingo-oophorectomy, appendectomy, left tubal ligation with   fulguration for endometriosis in 2014.  She had had a C-section in 2003, a   type of jaw surgery in 2004, chest wall biopsy in 2006, sinus surgery in 2000,   hernia repair in 1996 as well as the above gynecologic surgery.  More   recently in February she underwent laparoscopic lysis of abdominal adhesions,   had right omental adhesions resected, had a right salpingo-oophorectomy, an   appendectomy, a left tubal ligation with fulguration.  She had been diagnosed   with endometriosis, has had a right ovarian cyst and sterilization, has had   omental adhesions and a thickened appendectomy prompting the surgery to be   done.  She has had chronic abdominal pains with an extensive GI workup with   negative CT scans last year.  I had not seen here in a long time and then she   presented with diarrhea and nausea and ill-feeling back in 2015,  the previous   November is when she had had the right ovarian cyst with removal.  She had   had a recent urinary tract infection treated by Dr. Ma Rings at that time, had a   history of fibromyalgia.  As part of her chronic abdominal pain workup, she   had had a gastric emptying study which was mildly delayed and had a negative   small bowel series and negative CT of the abdomen and pelvis.  Had had in    March of 2015 EGD and colon that were okay except for two peptic ulcers.  She   had a negative right upper quadrant ultrasound in 10/2013.  She had been   treated for her ulcers with Nexium.  She had had a history of migraines with   aura, celiac disease, depression, fibromyalgia and anemia.  She had also had   an abnormal EKG and atypical chest discomfort.  Her EKG in the office had   shown anterior infarct possible with significant T inversion in V3, and it was   interesting in view of her family history.   Her father had had very high   lipids and had a heart attack before age 76.  Apparently other family members   had the hyperlipidemia as well.  She has been seen by Dr. Mary Sella for   perioperative clearance and has been cleared for that surgery.  She has an IUD   in, although she has one tube gone, the other has undergone a undergone a   tubal ligation of the remaining tube.  The patient has been on Lipitor and   felt extreme muscle aches.  As I mentioned the possibility of putting her on   Crestor and even had considered Repatha, and had sent her to see Dr. Mary Sella  who had agreed and put her on the Crestor and she is being monitored on this.        Her current medicines include Lexapro 30 mg, gabapentin 300 mg in the morning   and 600 mg at night, Topamax 100 mg at night, trazodone 50 mg 2 at night,   Bentyl at bedtime per Dr. Leonard Downing, Wellbutrin 150 mg SR daily, Prilosec 20 mg   b.i.d. and ProAir puffer.  She continues to have intermittent lower abdominal   discomfort but no dysuria, no urgency, no change in bowel movements.  She was   seen by Dr. Minerva Areola regarding her Wellbutrin.  She is had no period since   January 28.  A pregnancy test in my office is negative.  She had periods until   October and then in October, November, December and January and then she   stopped in January.  She has an IUD and is scheduled to see a new gynecologist    now that Dr. Ma Rings has retired. Again, she had a negative CT of the abdomen   and pelvis in September.      PHYSICAL EXAMINATION:  Her weight is 161 pounds, temperature 98.5, blood pressure is 118/78, pulse   68, pulse ox 98% on room air.  She is alert.    PUPILS:  Are equal, round and reactive to light and accommodation.    Extraocular movements are intact.    LUNGS:  Clear to auscultation and percussion.    HEART:  Regular rate and rhythm without murmur.  There is no carotid bruit.    There is no edema.    ABDOMEN:  There are no abdominal masses.  There is no abdominal tenderness.    There may be some mild suprapubic soreness.  No abdominal guarding, no   rebound.    PELVIC:  External genitalia normal.  Negative pelvic exam.  No chandelier   sign.    RECTAL:  Hemoccult is negative on rectal.  LYMPHATICS:  There is no cervical or axillary lymphadenopathy.   BREASTS:  Are without mass, tenderness or discharge.    It is interesting that she has some loss of uterine sidewall, tag or polyp   which she is to see the gynecologist and a scant white discharge; cultures   were obtained.    MUSCULOSKELETAL:  Strength is symmetrical and range of movement is okay.    Joints seemed to be okay.  SKIN:  Unremarkable.  NEUROLOGIC:  Nonfocal.  Mood, memory and judgment are intact.    She has no calf pain or cords.  Negative Homans'.  She has chronic muscle pain   that she relates to fibromyalgia.      Urine hCG in the office is negative.  She has had a negative stress test   recently; it was done on 02/14/14 with no evidence of ischemia, normal study.    Left ventricular ejection fraction 66%, no focal wall motion abnormalities   seen.  Additionally, she had laboratories done in my office.  The most recent   blood work will be listed subsequent, but she did have cultures that were   done.  No virus isolated in culture.  White count of 6300, hemoglobin and    hematocrit 13.7 and 41.7, MCV 93, platelets 301, 58 segs, 32 lymphs, 5 monos,   4 eos, 1 baso.  Glucose 87, BUN 13, creatinine slightly elevated at 1.16 with   a GFR of 57, which is stage III chronic kidney  disease.  Sodium is 139, potassium 4.3, chloride 104, CO2 21, calcium 9.6, protein 7,   albumin 4.2, globulin 2.8, bilirubin 0.4, alkaline phosphatase 103, SGOT 11,   SGPT 11.  Urine is negative, WBC is 0 to 5 RBC is 0 to 2, few bacteria.    Protime is INR 1, PTT 29, Trichomonas is negative, yeast negative, clue cells   negative, CPK is 46.  Serum hCG qualitative is negative and urine culture is   less than 10,000 bacteria.  The date of these laboratories was 05/17/14.  In   addition, the patient's last lipids back in March revealed a cholesterol of   270, triglycerides of 105, HDL of 44, and LDL of 205.  Clearly she has a   profoundly high lipid profile requiring intense therapy and Dr. Mary Sella will   follow her regarding this.  Her vitamin B12 was 445.  Folate 5.7.  GGT was   116.  Her laboratories from September and December of 2015, were remarkable   for normal thyroid testing in December with a TSH of 1.62 and a T4 of 8.4.    FSH interestingly was 8.4 certainly not consistent with a menopausal state.    Rheumatoid factor 13, sed rate 25, and the date of this was 01/05/2014.  I   feel that the patient should do well with the removal of the oral lesion on   her palate.  She has tolerated surgical procedures before.      HER ONLY ALLERGIES ARE DEMEROL AS A CHILD.  AUGMENTIN CAUSED COLITIS.    DARVOCET CAUSED A PROBLEM; APPARENTLY DARVON WAS TOLERATED.  HYDROCODONE HAS   GIVEN HER ITCHING ALL OVER AND SHOULD BE AVOIDED.      Her EKG in my office on 05/10/14 in preparation for the stress test was sinus   rhythm, possible anterior infarct, inferior T-wave changes, nonspecific and   the nuclear stress test has been described previously.  If you have any   questions about her, please do not hesitate to call me.       Sincerely,   Gaetana Michaelis. Gibson Ramp, M.D.      It should be noted on my cell phone number if you want to get me directly is   [phone].      ___________________  Vivianne Master MD  Dictated By:.   CDC  D:05/22/2014 18:19:52  T: 05/22/2014 19:57:16  [id number]

## 2014-05-27 ENCOUNTER — Inpatient Hospital Stay: Admit: 2014-05-27 | Payer: MEDICARE | Attending: Clinical Neurophysiology | Primary: Geriatric Medicine

## 2014-05-27 DIAGNOSIS — G35 Multiple sclerosis: Secondary | ICD-10-CM

## 2014-05-31 ENCOUNTER — Ambulatory Visit: Primary: Geriatric Medicine

## 2014-05-31 ENCOUNTER — Inpatient Hospital Stay: Payer: MEDICARE

## 2014-05-31 MED ORDER — DEXAMETHASONE SODIUM PHOSPHATE 4 MG/ML IJ SOLN
4 mg/mL | Freq: Once | INTRAMUSCULAR | Status: AC
Start: 2014-05-31 — End: 2014-05-31
  Administered 2014-05-31: 11:00:00 via INTRAVENOUS

## 2014-05-31 MED ORDER — PROPOFOL 10 MG/ML IV EMUL
10 mg/mL | INTRAVENOUS | Status: DC | PRN
Start: 2014-05-31 — End: 2014-05-31
  Administered 2014-05-31: 12:00:00 via INTRAVENOUS

## 2014-05-31 MED ORDER — DIPHENHYDRAMINE HCL 50 MG/ML IJ SOLN
50 mg/mL | Freq: Once | INTRAMUSCULAR | Status: DC | PRN
Start: 2014-05-31 — End: 2014-05-31

## 2014-05-31 MED ORDER — BUPIVACAINE-EPINEPHRINE (PF) 0.25 %-1:200,000 IJ SOLN
0.25 %-1:200,000 | INTRAMUSCULAR | Status: DC | PRN
Start: 2014-05-31 — End: 2014-05-31
  Administered 2014-05-31: 12:00:00

## 2014-05-31 MED ORDER — ONDANSETRON (PF) 4 MG/2 ML INJECTION
4 mg/2 mL | Freq: Once | INTRAMUSCULAR | Status: DC | PRN
Start: 2014-05-31 — End: 2014-05-31

## 2014-05-31 MED ORDER — FLUMAZENIL 0.1 MG/ML IV SOLN
0.1 mg/mL | INTRAVENOUS | Status: DC | PRN
Start: 2014-05-31 — End: 2014-05-31

## 2014-05-31 MED ORDER — ONDANSETRON (PF) 4 MG/2 ML INJECTION
4 mg/2 mL | INTRAMUSCULAR | Status: AC
Start: 2014-05-31 — End: ?

## 2014-05-31 MED ORDER — BUPIVACAINE-EPINEPHRINE (PF) 0.5 %-1:200,000 IJ SOLN
0.5 %-1:200,000 | INTRAMUSCULAR | Status: AC
Start: 2014-05-31 — End: ?

## 2014-05-31 MED ORDER — MIDAZOLAM 1 MG/ML IJ SOLN
1 mg/mL | INTRAMUSCULAR | Status: DC | PRN
Start: 2014-05-31 — End: 2014-05-31
  Administered 2014-05-31: 11:00:00 via INTRAVENOUS

## 2014-05-31 MED ORDER — LIDOCAINE (PF) 20 MG/ML (2 %) IJ SOLN
20 mg/mL (2 %) | INTRAMUSCULAR | Status: DC | PRN
Start: 2014-05-31 — End: 2014-05-31
  Administered 2014-05-31: 12:00:00 via INTRAVENOUS

## 2014-05-31 MED ORDER — KETAMINE 50 MG/ML (1 ML) INTRAVENOUS SYRINGE
50 mg/mL (1 mL) | INTRAVENOUS | Status: AC
Start: 2014-05-31 — End: ?

## 2014-05-31 MED ORDER — NALOXONE 0.4 MG/ML INJECTION
0.4 mg/mL | INTRAMUSCULAR | Status: DC | PRN
Start: 2014-05-31 — End: 2014-05-31

## 2014-05-31 MED ORDER — SODIUM CHLORIDE 0.9 % IJ SYRG
INTRAMUSCULAR | Status: DC | PRN
Start: 2014-05-31 — End: 2014-05-31

## 2014-05-31 MED ORDER — CLINDAMYCIN IN D5W 900 MG/50 ML IV PIGGY BACK
900 mg/50 mL | INTRAVENOUS | Status: DC
Start: 2014-05-31 — End: 2014-05-31

## 2014-05-31 MED ORDER — ONDANSETRON (PF) 4 MG/2 ML INJECTION
4 mg/2 mL | INTRAMUSCULAR | Status: DC | PRN
Start: 2014-05-31 — End: 2014-05-31
  Administered 2014-05-31: 11:00:00 via INTRAVENOUS

## 2014-05-31 MED ORDER — GLYCOPYRROLATE 0.2 MG/ML IJ SOLN
0.2 mg/mL | Freq: Once | INTRAMUSCULAR | Status: AC
Start: 2014-05-31 — End: 2014-05-31
  Administered 2014-05-31: 11:00:00 via INTRAVENOUS

## 2014-05-31 MED ORDER — GLYCOPYRROLATE 0.2 MG/ML IJ SOLN
0.2 mg/mL | INTRAMUSCULAR | Status: AC
Start: 2014-05-31 — End: ?

## 2014-05-31 MED ORDER — LIDOCAINE-EPINEPHRINE 1 %-1:100,000 IJ SOLN
1 %-:00,000 | INTRAMUSCULAR | Status: AC
Start: 2014-05-31 — End: ?

## 2014-05-31 MED ORDER — LACTATED RINGERS IV
INTRAVENOUS | Status: DC
Start: 2014-05-31 — End: 2014-05-31
  Administered 2014-05-31: 11:00:00 via INTRAVENOUS

## 2014-05-31 MED ORDER — LIDOCAINE (PF) 10 MG/ML (1 %) IJ SOLN
10 mg/mL (1 %) | Freq: Once | INTRAMUSCULAR | Status: DC | PRN
Start: 2014-05-31 — End: 2014-05-31

## 2014-05-31 MED ORDER — LABETALOL 5 MG/ML IV SOLN
5 mg/mL | INTRAVENOUS | Status: DC | PRN
Start: 2014-05-31 — End: 2014-05-31

## 2014-05-31 MED ORDER — MIDAZOLAM 1 MG/ML IJ SOLN
1 mg/mL | INTRAMUSCULAR | Status: AC
Start: 2014-05-31 — End: ?

## 2014-05-31 MED ORDER — HYDROMORPHONE (PF) 1 MG/ML IJ SOLN
1 mg/mL | INTRAMUSCULAR | Status: DC | PRN
Start: 2014-05-31 — End: 2014-05-31

## 2014-05-31 MED ORDER — DEXAMETHASONE SODIUM PHOSPHATE 4 MG/ML IJ SOLN
4 mg/mL | INTRAMUSCULAR | Status: AC
Start: 2014-05-31 — End: ?

## 2014-05-31 MED ORDER — LIDOCAINE HCL 1 % (10 MG/ML) IJ SOLN
10 mg/mL (1 %) | INTRAMUSCULAR | Status: AC
Start: 2014-05-31 — End: ?

## 2014-05-31 MED ORDER — FENTANYL CITRATE (PF) 50 MCG/ML IJ SOLN
50 mcg/mL | INTRAMUSCULAR | Status: DC | PRN
Start: 2014-05-31 — End: 2014-05-31

## 2014-05-31 MED ORDER — FENTANYL CITRATE (PF) 50 MCG/ML IJ SOLN
50 mcg/mL | INTRAMUSCULAR | Status: AC
Start: 2014-05-31 — End: ?

## 2014-05-31 MED ORDER — BUPIVACAINE-EPINEPHRINE (PF) 0.25 %-1:200,000 IJ SOLN
0.25 %-1:200,000 | INTRAMUSCULAR | Status: AC
Start: 2014-05-31 — End: ?

## 2014-05-31 MED ORDER — PROPOFOL 10 MG/ML IV EMUL
10 mg/mL | INTRAVENOUS | Status: AC
Start: 2014-05-31 — End: ?

## 2014-05-31 MED ORDER — KETAMINE 50 MG/ML IJ SOLN
50 mg/mL | INTRAMUSCULAR | Status: DC | PRN
Start: 2014-05-31 — End: 2014-05-31
  Administered 2014-05-31: 12:00:00 via INTRAVENOUS

## 2014-05-31 MED FILL — DEXAMETHASONE SODIUM PHOSPHATE 4 MG/ML IJ SOLN: 4 mg/mL | INTRAMUSCULAR | Qty: 5

## 2014-05-31 MED FILL — DIPRIVAN 10 MG/ML INTRAVENOUS EMULSION: 10 mg/mL | INTRAVENOUS | Qty: 30

## 2014-05-31 MED FILL — GLYCOPYRROLATE 0.2 MG/ML IJ SOLN: 0.2 mg/mL | INTRAMUSCULAR | Qty: 1

## 2014-05-31 MED FILL — CLINDAMYCIN IN D5W 900 MG/50 ML IV PIGGY BACK: 900 mg/50 mL | INTRAVENOUS | Qty: 50

## 2014-05-31 MED FILL — FENTANYL CITRATE (PF) 50 MCG/ML IJ SOLN: 50 mcg/mL | INTRAMUSCULAR | Qty: 2

## 2014-05-31 MED FILL — KETAMINE 50 MG/ML IJ SOLN: 50 mg/mL | INTRAMUSCULAR | Qty: 50

## 2014-05-31 MED FILL — ONDANSETRON (PF) 4 MG/2 ML INJECTION: 4 mg/2 mL | INTRAMUSCULAR | Qty: 2

## 2014-05-31 MED FILL — MARCAINE-EPINEPHRINE (PF) 0.25 %-1:200,000 INJECTION SOLUTION: 0.25 %-1:200,000 | INTRAMUSCULAR | Qty: 30

## 2014-05-31 MED FILL — GLYCOPYRROLATE 0.2 MG/ML IJ SOLN: 0.2 mg/mL | INTRAMUSCULAR | Qty: 5

## 2014-05-31 MED FILL — XYLOCAINE WITH EPINEPHRINE 1 %-1:100,000 INJECTION SOLUTION: 1 %-:00,000 | INTRAMUSCULAR | Qty: 20

## 2014-05-31 MED FILL — KETAMINE 50 MG/ML (1 ML) INTRAVENOUS SYRINGE: 50 mg/mL (1 mL) | INTRAVENOUS | Qty: 1

## 2014-05-31 MED FILL — LACTATED RINGERS IV: INTRAVENOUS | Qty: 1000

## 2014-05-31 MED FILL — DIPRIVAN 10 MG/ML INTRAVENOUS EMULSION: 10 mg/mL | INTRAVENOUS | Qty: 20

## 2014-05-31 MED FILL — BUPIVACAINE-EPINEPHRINE (PF) 0.5 %-1:200,000 IJ SOLN: 0.5 %-1:200,000 | INTRAMUSCULAR | Qty: 30

## 2014-05-31 MED FILL — LIDOCAINE HCL 1 % (10 MG/ML) IJ SOLN: 10 mg/mL (1 %) | INTRAMUSCULAR | Qty: 20

## 2014-05-31 MED FILL — LIDOCAINE (PF) 20 MG/ML (2 %) IJ SOLN: 20 mg/mL (2 %) | INTRAMUSCULAR | Qty: 20

## 2014-05-31 MED FILL — MIDAZOLAM 1 MG/ML IJ SOLN: 1 mg/mL | INTRAMUSCULAR | Qty: 2

## 2014-05-31 MED FILL — ONDANSETRON (PF) 4 MG/2 ML INJECTION: 4 mg/2 mL | INTRAMUSCULAR | Qty: 4

## 2014-05-31 NOTE — Op Note (Signed)
[facility]  Operation Report  NAME:  [name] [name], [name] [name]  SEX:   F  DATE: 05/31/2014  DOB: 01-11-1971  MR#    [id number]  ROOM:  03  ACCT#  [id number]        DATE OF OPERATION:  05/31/2014    PREOPERATIVE DIAGNOSIS:  Right-sided hard palatal lesion.    POSTOPERATIVE DIAGNOSIS:  Right-sided hard palatal lesion.    PROCEDURE PERFORMED:  Excision of right hard palatal lesion.    FINDINGS:  A small round mucosal lesion, somewhat firm measuring 8.5 x 4 mm, right side   of the palate just next to the last molar.    SURGEON:  Jennette Dubin, M.D.     ASSISTANT:  Nonah Mattes, III, surgical tech.    ANESTHESIA:  General anesthetic was MAC, monitored anesthesia with sedation by Dr. Hyacinth Meeker   and CRNA Teola Bradley.    SPECIMEN REMOVED:  Right hard palatal lesion sent for permanent.    SEDATION:  We used 0.25% Marcaine with 1:200,000 epinephrine, total of 2 mL were used.    COMPLICATIONS:  None.    COUNTS:  Instrument count, sponge count and pledget count were all correct at the end   of the procedure.     INDICATIONS:  The patient is a very pleasant 44 year old female who was seen in the office.    She has had this lesion come up and has been slowly getting larger over time.    This persisted for the last 3 years, she noticed it in 2013 and she has   noticed some increase.  She is wanting to have it removed.  She does not want   to have any other interventions attempted or tried.  She is interested in   having the lesion removed to rule out any kind of concern of malignancy.      ALLERGIES:  AUGMENTIN, DEMEROL AND PENICILLINS.    FAMILY HISTORY:  Significant for allergies, asthma, cancer.    SOCIAL HISTORY:  She has never smoked.  She is a nondrinker.  Well-developed, well-nourished.    PAST MEDICAL HISTORY:   On her past medical history, she had a couple other things listed:    Hypercholesterolemia, irritable bowel syndrome, celiac disease, migraines,   depression, anxiety, fibromyalgia.    PAST SURGICAL  HISTORY:  Sinus surgery in the past.  She has had fibroids removed from the breast in   the past, ovary removed, a C-section.    MEDICATIONS:  She is currently taking are Proventil as needed.  She is on Bentyl 20 mg she   takes at nighttime.  She is on Lexapro 20 mg by mouth daily.  She is on   gabapentin, Neurontin 600 mg tablets she takes nightly.  She is on Prilosec,   which she takes 2 times a day.  She is on Crestor 10 mg that she takes once at   night.  She is on Topamax that she takes 100 mg nightly.  She is on trazodone   which she takes 100 mg tablet nightly.    PHYSICAL EXAMINATION:  GENERAL:   She is a well-developed, well-nourished female.  Her voice is   normal.  Communicates well, inspection of the head and face within limits.    Ear exam:  Tympanic membranes intact and clear bilaterally.  Oral cavity and   oropharynx are clear.  The lesion noted on the posterior right side of the   hard molar next to the last molar.  It is under a millimeter.  It is somewhat   firm, but it appears to be mucosal/submucosal.  It has been present there for   several years.  The patient is wanting it removed.  There are no other mucosal   lesions.  Teeth are in good condition.  Nasal cavity shows nasal mucosa   congestion.  No lesions or masses noted.  She did have a fiberoptic   laryngoscopy performed.  True vocal cords are mobile with some posterior   glottic edema consistent with acid reflux.  We put her on Prilosec.  There are   no masses or lesions seen on the fiberoptic exam.  Neck with no palpable   lymphadenopathy without masses.    IMPRESSION:  The patient with a right-sided mucosal palatal lesion.  She was very   interested in having this removed.  She said it has gotten bigger over the   past year.  The dentist and PCP also feel like it needs to be removed.    Discussed excisional biopsy with the risks and complications related to that   including but not limited to death, bleeding, infection, scarring,  possibility   of being malignancy requiring additional intervention and possibility of it   recurring.  She understood all these risks and complications and requested to   proceed forward with surgery.      On the morning of surgery, I spoke with her, she was here by herself, a friend   had dropped her off and will be picking her up.  She did understand as   outpatient, should would be on a soft diet postoperatively and we did review   all that with her preoperatively.   She would have pain medicine if needed and   liquid antibiotic for a couple days and mouth rinses.  She did get her   medical clearance completed and was cleared for surgery by her primary care   doctor.    She has had major jaw surgery, C-section, right ovarian removal, appendectomy   and bilateral tubal ligation.  She had Dr. Everlene Balls do her medical   clearance.  She had a negative stress test, normal left ventricular function   that he had seen her for and he will follow her up again.  She also is under   the care of Dr. Thayer Jew, who has cleared her also for surgery so, Dr.   Mary Sella and Dr. Gibson Ramp did her medical clearance.    On the morning of surgery, I spoke with her.  She is comfortable with   proceeding forward with the surgery.  She understood all the instructions.    Once she was taken back to the operating suite where she was put on the   operating table, she was made comfortable by the anesthesia team and hooked up   the monitors.  The timeout was performed with everyone present in routine   fashion.  Normal prep and drape was performed.  Once we had the drape covered,   we did inject the palate with 0.25% Marcaine with 1:200,000 epinephrine and a   total of 2 mL were used, gave good anesthesia and blanching of the mucosa   surrounding the lesion.  A photo was taken with the 0-degree scope prior to   removal.  Once the anesthesia had time to set for a good 10 minutes, we then   tested it and she felt nothing but some pressure,  no pain.  We  then took the   lesion off with a 15 blade, an elliptical.   Following it around the lesion,   we excised it off and it actually came off and shelled out very nicely.  Then   pressure was held.  We used the needlepoint insulated Bovie to cauterize the   edges.  We irrigated with sterile saline, suctioned it.  No further bleeding   was noted.  We took a picture post-removal of lesion and the operative   procedure was concluded.  The patient tolerated the procedure very well,   handed back to the anesthesia team, and taken to actually to phase II to   recover and be discharged home with her friend who is here to pick her up.      ___________________  Ernst Spell MD  Dictated By:.   BB  D:05/31/2014 08:21:04  T: 05/31/2014 14:06:30  [id number]

## 2014-05-31 NOTE — Other (Signed)
Dr. Ambrose Pancoast at bedside to talk with patient

## 2014-05-31 NOTE — Brief Op Note (Signed)
BRIEF OPERATIVE NOTE    Date of Procedure: 05/31/2014   Preoperative Diagnosis:RIGHT HARD Palatal lesion [K13.79]  Postoperative Diagnosis: same  Procedure(s):  EXCISION RIGHT HARD PALATAL LESION  Surgeon(s) and Role:     * Jennette Dubin, MD - Primary  Anesthesia: MAC   Estimated Blood Loss: MINIMAL  Specimens: Right HARD  palatal lesion   Findings: 8.5X4MM ROUND LESION RIGHT HARD PALATE.   Complications: NONE  Implants: * No implants in log *

## 2014-05-31 NOTE — Other (Signed)
Pt out of OR awake and alert, denies pain, friend listed called and made aware that procedure is finished. Pt has some numbness to mouth, no active bleeding in mouth, upper palate incision area clean and dry

## 2014-05-31 NOTE — Anesthesia Post-Procedure Evaluation (Signed)
Post-Anesthesia Evaluation and Assessment    Patient: [name] [name] MRN: [id number]  SSN: [id number]    Date of Birth: 08-18-1970  Age: 44 y.o.  Sex: female       Cardiovascular Function/Vital Signs  Visit Vitals   Item Reading   ??? BP 96/63 mmHg   ??? Pulse 106   ??? Temp 36.7 ??C (98 ??F)   ??? Resp 20   ??? Ht 4' 11.5" (1.511 m)   ??? Wt 73.1 kg (161 lb 2.5 oz)   ??? BMI 32.02 kg/m2   ??? SpO2 98%       Patient is status post MAC anesthesia for Procedure(s):  EXCISION RIGHT HARD PALATAL LESION.    Nausea/Vomiting: None    Postoperative hydration reviewed and adequate.    Pain:  Pain Scale 1: Numeric (0 - 10) (05/31/14 1601)  Pain Intensity 1: 0 (05/31/14 0833)   Managed    Neurological Status:   Neuro (WDL): Within Defined Limits (05/31/14 0820)   At baseline    Mental Status and Level of Consciousness: Alert and oriented     Pulmonary Status:   O2 Device: Room air (05/31/14 0932)   Adequate oxygenation and airway patent    Complications related to anesthesia: None    Post-anesthesia assessment completed. No concerns    Signed By: Seymour Bars, DO     May 31, 2014

## 2014-05-31 NOTE — Other (Signed)
Pt able to tolerate PO fluids

## 2014-05-31 NOTE — Other (Signed)
Out of OR on room air, no oxygen administered

## 2014-05-31 NOTE — Op Note (Signed)
[facility]  Operation Report  NAME:  [name], [name]  SEX:   F  DATE: 05/31/2014  DOB: Nov 16, 1970  MR#    [id number]  ROOM:  03  ACCT#  [id number]        DATE OF OPERATION:  05/31/2014    PREOPERATIVE DIAGNOSIS:  Right-sided hard palatal lesion.    POSTOPERATIVE DIAGNOSIS:  Right-sided hard palatal lesion.    PROCEDURE PERFORMED:  Excision of right hard palatal lesion.    FINDINGS:  A small round mucosal lesion, somewhat firm measuring 8.5 x 4 mm, right side   of the palate just next to the last molar.    SURGEON:  Jennette Dubin, M.D.     ASSISTANT:  Nonah Mattes, III, surgical tech.    ANESTHESIA:  General anesthetic was MAC, monitored anesthesia with sedation by Dr. Hyacinth Meeker   and CRNA Teola Bradley.    SPECIMEN REMOVED:  Right hard palatal lesion sent for permanent.    SEDATION:  We used 0.25% Marcaine with 1:200,000 epinephrine, total of 2 mL were used.    COMPLICATIONS:  None.    COUNTS:  Instrument count, sponge count and pledget count were all correct at the end   of the procedure.     INDICATIONS:  The patient is a very pleasant 44 year old female who was seen in the office.    She has had this lesion come up and has been slowly getting larger over time.    This persisted for the last 3 years, she noticed it in 2013 and she has   noticed some increase.  She is wanting to have it removed.  She does not want   to have any other interventions attempted or tried.  She is interested in   having the lesion removed to rule out any kind of concern of malignancy.      ALLERGIES:  AUGMENTIN, DEMEROL AND PENICILLINS.    FAMILY HISTORY:  Significant for allergies, asthma, cancer.    SOCIAL HISTORY:  She has never smoked.  She is a nondrinker.  Well-developed, well-nourished.    PAST MEDICAL HISTORY:   On her past medical history, she had a couple other things listed:    Hypercholesterolemia, irritable bowel syndrome, celiac disease, migraines,   depression, anxiety, fibromyalgia.    PAST SURGICAL HISTORY:   Sinus surgery in the past.  She has had fibroids removed from the breast in   the past, ovary removed, a C-section.    MEDICATIONS:  She is currently taking are Proventil as needed.  She is on Bentyl 20 mg she   takes at nighttime.  She is on Lexapro 20 mg by mouth daily.  She is on   gabapentin, Neurontin 600 mg tablets she takes nightly.  She is on Prilosec,   which she takes 2 times a day.  She is on Crestor 10 mg that she takes once at   night.  She is on Topamax that she takes 100 mg nightly.  She is on trazodone   which she takes 100 mg tablet nightly.    PHYSICAL EXAMINATION:  GENERAL:   She is a well-developed, well-nourished female.  Her voice is   normal.  Communicates well, inspection of the head and face within limits.    Ear exam:  Tympanic membranes intact and clear bilaterally.  Oral cavity and   oropharynx are clear.  The lesion noted on the posterior right side of the   hard molar next to the last molar.  It is under a millimeter.  It is somewhat   firm, but it appears to be mucosal/submucosal.  It has been present there for   several years.  The patient is wanting it removed.  There are no other mucosal   lesions.  Teeth are in good condition.  Nasal cavity shows nasal mucosa   congestion.  No lesions or masses noted.  She did have a fiberoptic   laryngoscopy performed.  True vocal cords are mobile with some posterior   glottic edema consistent with acid reflux.  We put her on Prilosec.  There are   no masses or lesions seen on the fiberoptic exam.  Neck with no palpable   lymphadenopathy without masses.    IMPRESSION:  The patient with a right-sided mucosal palatal lesion.  She was very   interested in having this removed.  She said it has gotten bigger over the   past year.  The dentist and PCP also feel like it needs to be removed.    Discussed excisional biopsy with the risks and complications related to that   including but not limited to death, bleeding, infection, scarring, possibility    of being malignancy requiring additional intervention and possibility of it   recurring.  She understood all these risks and complications and requested to   proceed forward with surgery.      On the morning of surgery, I spoke with her, she was here by herself, a friend   had dropped her off and will be picking her up.  She did understand as   outpatient, should would be on a soft diet postoperatively and we did review   all that with her preoperatively.   She would have pain medicine if needed and   liquid antibiotic for a couple days and mouth rinses.  She did get her   medical clearance completed and was cleared for surgery by her primary care   doctor.    She has had major jaw surgery, C-section, right ovarian removal, appendectomy   and bilateral tubal ligation.  She had Dr. Everlene Balls do her medical   clearance.  She had a negative stress test, normal left ventricular function   that he had seen her for and he will follow her up again.  She also is under   the care of Dr. Thayer Jew, who has cleared her also for surgery so, Dr.   Mary Sella and Dr. Gibson Ramp did her medical clearance.    On the morning of surgery, I spoke with her.  She is comfortable with   proceeding forward with the surgery.  She understood all the instructions.    Once she was taken back to the operating suite where she was put on the   operating table, she was made comfortable by the anesthesia team and hooked up   the monitors.  The timeout was performed with everyone present in routine   fashion.  Normal prep and drape was performed.  Once we had the drape covered,   we did inject the palate with 0.25% Marcaine with 1:200,000 epinephrine and a   total of 2 mL were used, gave good anesthesia and blanching of the mucosa   surrounding the lesion.  A photo was taken with the 0-degree scope prior to   removal.  Once the anesthesia had time to set for a good 10 minutes, we then    tested it and she felt nothing but some pressure, no pain.  We  then took the   lesion off with a 15 blade, an elliptical.   Following it around the lesion,   we excised it off and it actually came off and shelled out very nicely.  Then   pressure was held.  We used the needlepoint insulated Bovie to cauterize the   edges.  We irrigated with sterile saline, suctioned it.  No further bleeding   was noted.  We took a picture post-removal of lesion and the operative   procedure was concluded.  The patient tolerated the procedure very well,   handed back to the anesthesia team, and taken to actually to phase II to   recover and be discharged home with her friend who is here to pick her up.      ___________________  Ernst Spell MD  Dictated By:.   BB  D:05/31/2014 08:21:04  T: 05/31/2014 14:06:30  [id number]

## 2014-05-31 NOTE — Anesthesia Pre-Procedure Evaluation (Signed)
Anesthetic History   No history of anesthetic complications            Review of Systems / Medical History  Patient summary reviewed, nursing notes reviewed and pertinent labs reviewed    Pulmonary  Within defined limits                 Neuro/Psych   Within defined limits           Cardiovascular  Within defined limits                Exercise tolerance: >4 METS     GI/Hepatic/Renal  Within defined limits              Endo/Other  Within defined limits           Other Findings              Physical Exam    Airway  Mallampati: II  TM Distance: > 6 cm  Neck ROM: normal range of motion        Cardiovascular  Regular rate and rhythm,  S1 and S2 normal,  no murmur, click, rub, or gallop             Dental  No notable dental hx       Pulmonary  Breath sounds clear to auscultation               Abdominal  GI exam deferred       Other Findings            Anesthetic Plan    ASA: 2  Anesthesia type: general          Induction: Intravenous  Anesthetic plan and risks discussed with: Patient

## 2015-10-30 ENCOUNTER — Encounter

## 2016-01-30 ENCOUNTER — Inpatient Hospital Stay: Payer: MEDICARE | Attending: Gastroenterology | Primary: Geriatric Medicine

## 2016-02-02 ENCOUNTER — Inpatient Hospital Stay: Admit: 2016-02-02 | Payer: MEDICARE | Attending: Gastroenterology | Primary: Geriatric Medicine

## 2016-02-02 DIAGNOSIS — M859 Disorder of bone density and structure, unspecified: Secondary | ICD-10-CM

## 2016-02-15 ENCOUNTER — Encounter

## 2016-02-15 ENCOUNTER — Inpatient Hospital Stay: Admit: 2016-02-15 | Payer: MEDICARE | Attending: Geriatric Medicine | Primary: Geriatric Medicine

## 2016-02-15 DIAGNOSIS — M546 Pain in thoracic spine: Secondary | ICD-10-CM

## 2016-02-17 ENCOUNTER — Emergency Department: Admit: 2016-02-17 | Payer: MEDICARE | Primary: Geriatric Medicine

## 2016-02-17 ENCOUNTER — Inpatient Hospital Stay
Admit: 2016-02-17 | Discharge: 2016-02-21 | Disposition: A | Discharge status: Home / Self Care | Payer: MEDICARE | Attending: Internal Medicine | Admitting: Internal Medicine

## 2016-02-17 DIAGNOSIS — K559 Vascular disorder of intestine, unspecified: Secondary | ICD-10-CM

## 2016-02-17 LAB — CBC WITH AUTOMATED DIFF
BASOPHILS: 0.3 % (ref 0–3)
EOSINOPHILS: 0.6 % (ref 0–5)
HCT: 41.9 % (ref 37.0–50.0)
HGB: 14.3 gm/dl (ref 13.0–17.2)
IMMATURE GRANULOCYTES: 0.6 % (ref 0.0–3.0)
LYMPHOCYTES: 8.1 % — ABNORMAL LOW (ref 28–48)
MCH: 32.2 pg (ref 25.4–34.6)
MCHC: 34.1 gm/dl (ref 30.0–36.0)
MCV: 94.4 fL (ref 80.0–98.0)
MONOCYTES: 9.8 % (ref 1–13)
MPV: 10.8 fL — ABNORMAL HIGH (ref 6.0–10.0)
NEUTROPHILS: 80.6 % — ABNORMAL HIGH (ref 34–64)
NRBC: 0 (ref 0–0)
PLATELET: 222 10*3/uL (ref 140–450)
RBC: 4.44 M/uL (ref 3.60–5.20)
RDW-SD: 46.5 — ABNORMAL HIGH (ref 36.4–46.3)
WBC: 18.9 10*3/uL — ABNORMAL HIGH (ref 4.0–11.0)

## 2016-02-17 LAB — POC CHEM8
BUN: 10 mg/dl (ref 7–25)
CALCIUM,IONIZED: 4.5 mg/dL (ref 4.40–5.40)
CO2, TOTAL: 20 mmol/L — ABNORMAL LOW (ref 21–32)
Chloride: 108 mEq/L — ABNORMAL HIGH (ref 98–107)
Creatinine: 1.1 mg/dl (ref 0.6–1.3)
Glucose: 97 mg/dL (ref 74–106)
HCT: 45 % (ref 38–45)
HGB: 15.3 gm/dl (ref 13.0–17.2)
Potassium: 4 mEq/L (ref 3.5–4.9)
Sodium: 138 mEq/L (ref 136–145)

## 2016-02-17 MED ORDER — SODIUM CHLORIDE 0.9% BOLUS IV
0.9 % | INTRAVENOUS | Status: AC
Start: 2016-02-17 — End: 2016-02-17
  Administered 2016-02-17: via INTRAVENOUS

## 2016-02-17 MED ORDER — ONDANSETRON (PF) 4 MG/2 ML INJECTION
4 mg/2 mL | Freq: Once | INTRAMUSCULAR | Status: AC
Start: 2016-02-17 — End: 2016-02-17
  Administered 2016-02-17: via INTRAVENOUS

## 2016-02-17 MED ORDER — SODIUM CHLORIDE 0.9 % IJ SYRG
Freq: Once | INTRAMUSCULAR | Status: AC
Start: 2016-02-17 — End: 2016-02-17
  Administered 2016-02-18: 05:00:00 via INTRAVENOUS

## 2016-02-17 MED FILL — ONDANSETRON (PF) 4 MG/2 ML INJECTION: 4 mg/2 mL | INTRAMUSCULAR | Qty: 2

## 2016-02-17 NOTE — ED Provider Notes (Signed)
[facility]  Emergency Department Treatment Report    Patient: [name] [name] Age: 46 y.o. Sex: female    Date of Birth: 18-Mar-1970 Admit Date: 02/17/2016 PCP: Marlowe Kays, MD   MRN: [id number]  CSN: [id number]  Attending: Lissa Merlin, M.D.    Room: 4209/4209 Time Dictated: 10:44 PM APP: Gwyneth Revels, PA-C     I hereby certify this patient for admission based upon medical necessity as ??  noted below:    Chief Complaint   Chief Complaint   Patient presents with   ??? Abdominal Pain   ??? Diarrhea   ??? Dizziness       History of Present Illness   46 y.o. female presenting to the emergency department today secondary to what he diarrhea.  Patient reports that yesterday she had some generalized fatigue with decreased appetite.  Around 7:00 last night, she started having some lower abdominal cramping and later proceeded to have diarrhea.  She started noticing blood in her stool around 11 PM in the past for bowel movements have been just straight leg, seeing no stool mixed in.  She reports nausea but denies any vomiting.  She does report some dizziness but denies any shortness of breath.  She reports a low-grade temperature of 100.6 today and due to worsening symptoms, here for further evaluation.    Review of Systems   ROS  Constitutional:  Positive fever  Eyes: No visual symptoms.  ENT: No sore throat, runny nose or ear pain.  Respiratory: No cough, dyspnea or wheezing.  [place]: No chest pain, pressure, palpitations, tightness or heaviness.  Gastrointestinal: Positive for abdominal pain with bloody diarrhea.  No vomiting.  Genitourinary: No dysuria, frequency, or urgency.  Musculoskeletal: No joint pain or swelling.  Integumentary: No rashes.  Neurological: Does report headache with dizziness  Denies complaints in all other systems.    Past Medical/Surgical History     Past Medical History:   Diagnosis Date   ??? Chronic kidney disease     stage 3   ??? Fibromyalgia    ??? Palate mass       Past Surgical History:   Procedure Laterality Date   ??? HX APPENDECTOMY     ??? HX CESAREAN SECTION     ??? HX HERNIA REPAIR      left   ??? HX OTHER SURGICAL      jaw surgery   ??? HX OVARIAN CYST REMOVAL     ??? HX SEPTOPLASTY     ??? HX TUBAL LIGATION         Social History     Social History     Social History   ??? Marital status: MARRIED     Spouse name: N/A   ??? Number of children: N/A   ??? Years of education: N/A     Social History Main Topics   ??? Smoking status: Never Smoker   ??? Smokeless tobacco: None   ??? Alcohol use No   ??? Drug use: No   ??? Sexual activity: Not Asked     Other Topics Concern   ??? None     Social History Narrative       Family History     Family History   Problem Relation Age of Onset   ??? Kidney Disease Mother    ??? Depression Mother    ??? Heart Disease Father        Home Medications     Prior to Admission Medications  Prescriptions Last Dose Informant Patient Reported? Taking?   MODAFINIL (PROVIGIL PO)   Yes Yes   Sig: Take  by mouth.   albuterol (PROVENTIL HFA, VENTOLIN HFA, PROAIR HFA) 90 mcg/actuation inhaler   Yes Yes   Sig: Take 2 Puffs by inhalation every six (6) hours as needed for Wheezing.   buPROPion XL (WELLBUTRIN XL) 300 mg XL tablet   Yes Yes   Sig: Take 300 mg by mouth every morning.   dicyclomine (BENTYL) 20 mg tablet   Yes Yes   Sig: Take 20 mg by mouth nightly.   gabapentin (NEURONTIN) 600 mg tablet   Yes Yes   Sig: Take 400 mg by mouth two (2) times a day.   topiramate (TOPAMAX) 100 mg tablet   Yes Yes   Sig: Take 100 mg by mouth nightly.   traZODone (DESYREL) 100 mg tablet   Yes Yes   Sig: Take 100 mg by mouth nightly.      Facility-Administered Medications: None       Allergies     Allergies   Allergen Reactions   ??? Augmentin [Amoxicillin-Pot Clavulanate] Diarrhea   ??? Demerol [Meperidine] Hives and Nausea and Vomiting       Physical Exam   ED Triage Vitals   Enc Vitals Group      BP 02/17/16 1707 109/73      Pulse (Heart Rate) 02/17/16 1707 101      Resp Rate 02/17/16 1707 16       Temp 02/17/16 1707 98 ??F (36.7 ??C)      Temp src --       O2 Sat (%) 02/17/16 1707 98 %      Weight 02/17/16 1655 170 lb      Height 02/17/16 1655 4' 11.5"      Head Cir --       Peak Flow --       Pain Score --       Pain Loc --       Pain Edu? --       Excl. in [place]? --      Physical Exam    Constitutional: Patient appears well developed and well nourished.  Appearance and behavior are age and situation appropriate.  HEENT: Conjunctiva clear.  PERRLA. Mucous membranes dry, non-erythematous. Surface of the pharynx, palate, and tongue are pink, moist and without lesions.  Neck: supple, non tender, symmetrical, no masses or JVD.   Respiratory: lungs clear to auscultation, nonlabored respirations. No tachypnea or accessory muscle use.  Cardiovascular: heart regular rate and rhythm without murmur rubs or gallops.   Calves soft and non-tender. Distal pulses 2+ and equal bilaterally.  No peripheral edema or significant variscosities.    Gastrointestinal:  Normoactive bowel sounds.  Abdomen is soft.  There is diffuse pain to the abdomen.  Pain seems to be in the center of the abdomen epigastric umbilical and suprapubic regions.  No rebound or guarding.  Rectal: No masses or hemorrhoids.  Bright red blood per rectum obtained on exam which is guaiac positive.  Musculoskeletal: Nail beds pink with prompt capillary refill.  No midline back pain.  Moving all extremities.  Integumentary: warm and dry without rashes or lesions  Neurologic: alert and oriented, Sensation intact, motor strength equal and symmetric.     Impression and Management Plan   46 year old female here secondary to abdominal pain with bloody diarrhea.  Due to abdominal pain, we will obtain CT of her abdomen.  Due to  history of stage I kidney disease, this will be a dry scan.  Basic blood work be obtained.  Diagnostic Studies   Lab:   Recent Results (from the past 12 hour(s))   CBC WITH AUTOMATED DIFF    Collection Time: 02/17/16  6:36 PM    Result Value Ref Range    WBC 18.9 (H) 4.0 - 11.0 1000/mm3    RBC 4.44 3.60 - 5.20 M/uL    HGB 14.3 13.0 - 17.2 gm/dl    HCT 41.9 37.0 - 50.0 %    MCV 94.4 80.0 - 98.0 fL    MCH 32.2 25.4 - 34.6 pg    MCHC 34.1 30.0 - 36.0 gm/dl    PLATELET 222 140 - 450 1000/mm3    MPV 10.8 (H) 6.0 - 10.0 fL    RDW-SD 46.5 (H) 36.4 - 46.3      NRBC 0 0 - 0      IMMATURE GRANULOCYTES 0.6 0.0 - 3.0 %    NEUTROPHILS 80.6 (H) 34 - 64 %    LYMPHOCYTES 8.1 (L) 28 - 48 %    MONOCYTES 9.8 1 - 13 %    EOSINOPHILS 0.6 0 - 5 %    BASOPHILS 0.3 0 - 3 %   METABOLIC PANEL, COMPREHENSIVE    Collection Time: 02/17/16  6:36 PM   Result Value Ref Range    Sodium 140 136 - 145 mEq/L    Potassium 3.9 3.5 - 5.1 mEq/L    Chloride 109 (H) 98 - 107 mEq/L    CO2 23 21 - 32 mEq/L    Glucose 91 74 - 106 mg/dl    BUN 10 7 - 25 mg/dl    Creatinine 1.2 0.6 - 1.3 mg/dl    GFR est AA >60.0      GFR est non-AA 51      Calcium 8.6 8.5 - 10.1 mg/dl    AST (SGOT) 8 (L) 15 - 37 U/L    ALT (SGPT) 23 12 - 78 U/L    Alk. phosphatase 83 45 - 117 U/L    Bilirubin, total 1.0 0.2 - 1.0 mg/dl    Protein, total 6.6 6.4 - 8.2 gm/dl    Albumin 2.9 (L) 3.4 - 5.0 gm/dl   LIPASE    Collection Time: 02/17/16  6:36 PM   Result Value Ref Range    Lipase 104 73 - 393 U/L   BLOOD TYPE, (ABO+RH)    Collection Time: 02/17/16  6:36 PM   Result Value Ref Range    ABO/Rh(D) A Rh Positive     ANTIBODY SCREEN    Collection Time: 02/17/16  6:36 PM   Result Value Ref Range    Antibody screen NEG     POC CHEM8    Collection Time: 02/17/16  6:37 PM   Result Value Ref Range    Sodium 138 136 - 145 mEq/L    Potassium 4.0 3.5 - 4.9 mEq/L    Chloride 108 (H) 98 - 107 mEq/L    CO2, TOTAL 20 (L) 21 - 32 mmol/L    Glucose 97 74 - 106 mg/dL    BUN 10 7 - 25 mg/dl    Creatinine 1.1 0.6 - 1.3 mg/dl    HCT 45 38 - 45 %    HGB 15.3 13.0 - 17.2 gm/dl    CALCIUM,IONIZED 4.50 4.40 - 5.40 mg/dL   LACTIC ACID    Collection Time: 02/17/16  7:20 PM   Result Value  Ref Range    Lactic Acid 0.7 0.4 - 2.0 mmol/L    POC URINE MACROSCOPIC    Collection Time: 02/17/16  8:00 PM   Result Value Ref Range    Glucose Negative NEGATIVE,Negative mg/dl    Bilirubin Negative NEGATIVE,Negative      Ketone Negative NEGATIVE,Negative mg/dl    Specific gravity >=1.030 1.005 - 1.030      Blood Trace-lysed (A) NEGATIVE,Negative      pH (UA) 7.0 5 - 9      Protein Negative NEGATIVE,Negative mg/dl    Urobilinogen 0.2 0.0 - 1.0 EU/dl    Nitrites Negative NEGATIVE,Negative      Leukocyte Esterase Negative NEGATIVE,Negative      Color Yellow      Appearance Clear     POC HCG,URINE    Collection Time: 02/17/16  8:07 PM   Result Value Ref Range    HCG urine, Ql. negative NEGATIVE,Negative,negative       Imaging:    Ct Abd Pelv Wo Cont    Result Date: 02/17/2016  EXAM: CT ABD PELV WO CONT INDICATION: lower abdominal pain, rectal bleeding    TECHNIQUE: Axial CT scan of the abdomen and pelvis without   IV contrast    including coronal and sagittal reconstructions. COMPARISON: 10/14/2013 FINDINGS: Lower thorax: No acute findings ABDOMEN: Liver: Unremarkable. No mass. Gallbladder and bile ducts: Unremarkable. No calcified stones. No ductal dilatation. Pancreas: Unremarkable. No ductal dilatation. No mass. Spleen: Unremarkable. No splenomegaly. Adrenals: Unremarkable. No mass. Kidneys and ureters: 3 mm nonobstructing left kidney stone. Appendix: Appendectomy. Stomach and bowel: Wall thickening of the transverse and descending colon. Pericolonic stranding. No bowel obstruction. Peritoneum: Unremarkable. No significant fluid collection. No free air. Lymph nodes: Unremarkable. No enlarged lymph nodes. Vasculature: Unremarkable. No aortic aneurysm. Bones: No acute fracture. Abdominal wall: Unremarkable. No evidence of a hernia. PELVIS: Bladder: Unremarkable. No mass. Reproductive: IUD in the endometrial canal.     IMPRESSION: 1. Colitis either inflammatory or infectious. 2. 3 mm nonobstructing left kidney stone. 3. IUD.       Procedures    ED Course    Patient stable.  She was given a total 2 L of IV fluids for hydration and the [place].  She'll get IV Zofran for nausea.  She initially with low blood pressures of low 100s.  With IV fluids, blood pressures improved.  She was offered pain medications when blood pressure and improve the patient stated that her pain was controlled with just IV fluids and Zofran.  She was complaining of headache and was given some oral Tylenol.    Based on CT scan results, we have added blood cultures and started the patient on IV Cipro and IV Flagyl.  I have spoken with Dr. Clydene Fake, [facility] hospitalist, who is agreeable to accept the patient to his service.  C. difficile cultures are pending.  Medical Decision Making   As above  Final Diagnosis       ICD-10-CM ICD-9-CM   1. Colitis K52.9 558.9   2. Rectal bleeding K62.5 569.3     Disposition   Home    Current Discharge Medication List          The patient was personally evaluated by myself and Dr. Christy Sartorius who agrees with the above assessment and plan    Dragon medical dictation software was used for portions of this report. Unintended transcription errors may occur.     Gwyneth Revels, PA-C  February 17, 2016  My signature above authenticates this document and my orders, the final ??  diagnosis (es), discharge prescription (s), and instructions in the Epic ??  record.  If you have any questions please contact [phone].  ??  Nursing notes have been reviewed by the physician/ advanced practice ??  Clinician.

## 2016-02-17 NOTE — H&P (Signed)
Medicine History and Physical    Patient: [name] [name]   Age:  46 y.o.    Assessment     Acute colitis  Rectal bleeding  Rule out cdiff  CKD    Plan     abx  Check cdiff  Stool studies   serial abd exam   Monitor hgb  Supportive mgt  dvt ppx        Further recommendations based on clinical course. Discussed with patient current assessment and plan and answered all question. Thank you very much for allowing me to participate in this very pleasant patient's care.     DISPO  -Pt to be admitted  at this time for reasons addressed above, continued hospitalization for ongoing assessment and treatment indicated     Anticipated Date of Discharge: 3 days  Anticipated Disposition (home, SNF) : home      HPI:   [name] [name] is a 46 y.o. year old female who presents with  Bloody Diarrhea and abd pain.    Patient is a very pleasant 46 yo F yesterday started to have diarrhea with blood, abd pain. Yesterday she had bloody diarrhea, periumbilical abd pain. She had 10 episodes diarrhea. She has history of colitis, last one about 13 yrs ago. She follows with Dr Leonard Downing. She denies recent antibiotic use        Review of Systems - positive responses in bold type   Constitutional: Negative for fever, chills, diaphoresis and unexpected weight change.   HENT: Negative for ear pain, congestion, sore throat, rhinorrhea, drooling, trouble swallowing, neck pain and tinnitus.   Eyes: Negative for photophobia, pain, redness and visual disturbance.   Respiratory: negative for shortness of breath, cough, choking, chest tightness, wheezing or stridor.   Cardiovascular: Negative for chest pain, palpitations and leg swelling.   Gastrointestinal: see HPI.   Genitourinary: Negative for dysuria, urgency, frequency, hematuria, flank pain and difficulty urinating.   Musculoskeletal: Negative for back pain and arthralgias.   Skin: Negative for color change, rash and wound.    Neurological: Negative for dizziness, seizures, syncope, speech difficulty, light-headedness or headaches.   Hematological: Does not bruise/bleed easily.   Psychiatric/Behavioral: Negative for suicidal ideas, hallucinations, behavioral problems, self-injury or agitation       Past Medical History:  Past Medical History:   Diagnosis Date   ??? Chronic kidney disease     stage 3   ??? Fibromyalgia    ??? Palate mass        Past Surgical History:  Past Surgical History:   Procedure Laterality Date   ??? HX APPENDECTOMY     ??? HX CESAREAN SECTION     ??? HX HERNIA REPAIR      left   ??? HX OTHER SURGICAL      jaw surgery   ??? HX OVARIAN CYST REMOVAL     ??? HX SEPTOPLASTY     ??? HX TUBAL LIGATION         Family History:  Family History   Problem Relation Age of Onset   ??? Kidney Disease Mother    ??? Depression Mother    ??? Heart Disease Father        Social History:  Social History     Social History   ??? Marital status: MARRIED     Spouse name: N/A   ??? Number of children: N/A   ??? Years of education: N/A     Social History Main Topics   ??? Smoking  status: Never Smoker   ??? Smokeless tobacco: None   ??? Alcohol use No   ??? Drug use: No   ??? Sexual activity: Not Asked     Other Topics Concern   ??? None     Social History Narrative       Home Medications:  Prior to Admission medications    Medication Sig Start Date End Date Taking? Authorizing Provider   buPROPion XL (WELLBUTRIN XL) 300 mg XL tablet Take 300 mg by mouth every morning.   Yes Phys Other, MD   MODAFINIL (PROVIGIL PO) Take  by mouth.   Yes Phys Other, MD   gabapentin (NEURONTIN) 600 mg tablet Take 400 mg by mouth two (2) times a day.   Yes Historical Provider   topiramate (TOPAMAX) 100 mg tablet Take 100 mg by mouth nightly.   Yes Historical Provider   traZODone (DESYREL) 100 mg tablet Take 100 mg by mouth nightly.   Yes Historical Provider   dicyclomine (BENTYL) 20 mg tablet Take 20 mg by mouth nightly.   Yes Historical Provider    albuterol (PROVENTIL HFA, VENTOLIN HFA, PROAIR HFA) 90 mcg/actuation inhaler Take 2 Puffs by inhalation every six (6) hours as needed for Wheezing.   Yes Historical Provider       Allergies:  Allergies   Allergen Reactions   ??? Augmentin [Amoxicillin-Pot Clavulanate] Diarrhea   ??? Demerol [Meperidine] Hives and Nausea and Vomiting           Physical Exam:     Visit Vitals   ??? BP 110/67   ??? Pulse 88   ??? Temp 99.4 ??F (37.4 ??C)   ??? Resp 15   ??? Ht 4' 11.5" (1.511 m)   ??? Wt 77.1 kg (170 lb)   ??? SpO2 99%   ??? BMI 33.76 kg/m2       Physical Exam:  General appearance: alert, cooperative  Head: Normocephalic   Neck: supple, trachea midline  Lungs: clear to auscultation bilaterally  Heart: regular rate and rhythm,    Abdomen: soft, periumbilical tenderness, no rebound no guarding  Extremities: extremities no edema  Skin: Skin pale  Neurologic: Grossly normal  PSY: mood and affect normal, appropriately behaved     Intake and Output:  Current Shift:     Last three shifts:       Lab/Data Reviewed:  BMP:   Lab Results   Component Value Date/Time    NA 138 02/17/2016 06:37 PM    K 4.0 02/17/2016 06:37 PM    CL 108 (H) 02/17/2016 06:37 PM    CO2 20 (L) 02/17/2016 06:37 PM    CO2 23 02/17/2016 06:36 PM    GLU 97 02/17/2016 06:37 PM    BUN 10 02/17/2016 06:37 PM    CREA 1.1 02/17/2016 06:37 PM    GFRAA >60.0 02/17/2016 06:36 PM    GFRNA 51 02/17/2016 06:36 PM     CMP:   Lab Results   Component Value Date/Time    NA 138 02/17/2016 06:37 PM    K 4.0 02/17/2016 06:37 PM    CL 108 (H) 02/17/2016 06:37 PM    CO2 20 (L) 02/17/2016 06:37 PM    CO2 23 02/17/2016 06:36 PM    GLU 97 02/17/2016 06:37 PM    BUN 10 02/17/2016 06:37 PM    CREA 1.1 02/17/2016 06:37 PM    GFRAA >60.0 02/17/2016 06:36 PM    GFRNA 51 02/17/2016 06:36 PM    CA 8.6 02/17/2016  06:36 PM    ALB 2.9 (L) 02/17/2016 06:36 PM    TP 6.6 02/17/2016 06:36 PM    SGOT 8 (L) 02/17/2016 06:36 PM    ALT 23 02/17/2016 06:36 PM     CBC:   Lab Results   Component Value Date/Time     WBC 18.9 (H) 02/17/2016 06:36 PM    HGB 15.3 02/17/2016 06:37 PM    HCT 45 02/17/2016 06:37 PM    PLT 222 02/17/2016 06:36 PM     All Cardiac Markers in the last 24 hours: No results found for: CPK, CK, CKMMB, CKMB, RCK3, CKMBT, CKNDX, CKND1, MYO, TROPT, TROIQ, TROI, TROPT, TNIPOC, BNP, BNPP  Recent Glucose Results:   Lab Results   Component Value Date/Time    GLU 97 02/17/2016 06:37 PM    GLU 91 02/17/2016 06:36 PM     ABG: No results found for: PH, PHI, PCO2, PCO2I, PO2, PO2I, HCO3, HCO3I, FIO2, FIO2I  COAGS: No results found for: APTT, PTP, INR  Liver Panel:   Lab Results   Component Value Date/Time    ALB 2.9 (L) 02/17/2016 06:36 PM    TP 6.6 02/17/2016 06:36 PM    SGOT 8 (L) 02/17/2016 06:36 PM    ALT 23 02/17/2016 06:36 PM    AP 83 02/17/2016 06:36 PM     Pancreatic Markers:   Lab Results   Component Value Date/Time    LPSE 104 02/17/2016 06:36 PM     CT Results  (Last 48 hours)               02/17/16 2019  CT ABD PELV WO CONT Final result    Impression:  IMPRESSION:       1. Colitis either inflammatory or infectious.   2. 3 mm nonobstructing left kidney stone.   3. IUD.           Narrative:  EXAM: CT ABD PELV WO CONT       INDICATION: lower abdominal pain, rectal bleeding           TECHNIQUE: Axial CT scan of the abdomen and pelvis without   IV contrast       including coronal and sagittal reconstructions.       COMPARISON: 10/14/2013       FINDINGS:   Lower thorax: No acute findings       ABDOMEN:   Liver: Unremarkable. No mass.   Gallbladder and bile ducts: Unremarkable. No calcified stones. No ductal   dilatation.   Pancreas: Unremarkable. No ductal dilatation. No mass.   Spleen: Unremarkable. No splenomegaly.   Adrenals: Unremarkable. No mass.   Kidneys and ureters: 3 mm nonobstructing left kidney stone.   Appendix: Appendectomy.   Stomach and bowel: Wall thickening of the transverse and descending colon.   Pericolonic stranding. No bowel obstruction.    Peritoneum: Unremarkable. No significant fluid collection. No free air.   Lymph nodes: Unremarkable. No enlarged lymph nodes.   Vasculature: Unremarkable. No aortic aneurysm.   Bones: No acute fracture.   Abdominal wall: Unremarkable. No evidence of a hernia.       PELVIS:   Bladder: Unremarkable. No mass.   Reproductive: IUD in the endometrial canal.                     Meryle Ready, MD  February 17, 2016

## 2016-02-17 NOTE — ED Notes (Signed)
First contact with patient, received report from [place], rn. Pt lying on stretcher, family at bedside, VS updated, IVF infusing w/o difficulty. Pt aware needs to provide urine specimen

## 2016-02-17 NOTE — ED Notes (Signed)
Second blood culture drawn, antibiotics started, awaits to go to floor

## 2016-02-17 NOTE — Other (Signed)
TRANSFER - OUT REPORT:    Verbal report given to [place], rn(name) on [facility]  being transferred to [facility](unit) for routine progression of care       Report consisted of patient???s Situation, Background, Assessment and   Recommendations(SBAR).     Information from the following report(s) SBAR, ED Summary, Procedure Summary and MAR was reviewed with the receiving nurse.    Lines:   Peripheral IV 02/17/16 Left Antecubital (Active)   Site Assessment Clean, dry, & intact 02/17/2016  6:30 PM   Phlebitis Assessment 0 02/17/2016  6:30 PM   Infiltration Assessment 0 02/17/2016  6:30 PM   Dressing Status Clean, dry, & intact 02/17/2016  6:30 PM   Dressing Type Transparent 02/17/2016  6:30 PM   Hub Color/Line Status Pink 02/17/2016  6:30 PM   Action Taken Blood drawn 02/17/2016  6:30 PM        Opportunity for questions and clarification was provided.      Patient transported with:   [employer]

## 2016-02-17 NOTE — ED Triage Notes (Signed)
Abdomen cramps, diarrhea, bloody stools since last night, is now lightheaded

## 2016-02-18 LAB — BLOOD TYPE, (ABO+RH)
ABO/Rh(D): A POS
ABO/Rh: A POS

## 2016-02-18 LAB — CBC WITH AUTOMATED DIFF
BASOPHILS: 0.5 % (ref 0–3)
EOSINOPHILS: 0.8 % (ref 0–5)
HCT: 36.4 % — ABNORMAL LOW (ref 37.0–50.0)
HGB: 12.1 gm/dl — ABNORMAL LOW (ref 13.0–17.2)
IMMATURE GRANULOCYTES: 0.3 % (ref 0.0–3.0)
LYMPHOCYTES: 10.2 % — ABNORMAL LOW (ref 28–48)
MCH: 31.8 pg (ref 25.4–34.6)
MCHC: 33.2 gm/dl (ref 30.0–36.0)
MCV: 95.8 fL (ref 80.0–98.0)
MONOCYTES: 8.2 % (ref 1–13)
MPV: 11.2 fL — ABNORMAL HIGH (ref 6.0–10.0)
NEUTROPHILS: 80 % — ABNORMAL HIGH (ref 34–64)
NRBC: 0 (ref 0–0)
PLATELET: 193 10*3/uL (ref 140–450)
RBC: 3.8 M/uL (ref 3.60–5.20)
RDW-SD: 47.4 — ABNORMAL HIGH (ref 36.4–46.3)
WBC: 15.5 10*3/uL — ABNORMAL HIGH (ref 4.0–11.0)

## 2016-02-18 LAB — POC URINE MACROSCOPIC
Bilirubin: NEGATIVE
Glucose: NEGATIVE mg/dl
Ketone: NEGATIVE mg/dl
Leukocyte Esterase: NEGATIVE
Nitrites: NEGATIVE
Protein: NEGATIVE mg/dl
Specific gravity: >=1.03 (ref 1.005–1.030)
Urobilinogen: 0.2 EU/dl (ref 0.0–1.0)
pH (UA): 7 (ref 5–9)

## 2016-02-18 LAB — MAGNESIUM: Magnesium: 1.9 mg/dl (ref 1.6–2.6)

## 2016-02-18 LAB — METABOLIC PANEL, COMPREHENSIVE
ALT (SGPT): 23 U/L (ref 12–78)
AST (SGOT): 8 U/L — ABNORMAL LOW (ref 15–37)
Albumin: 2.9 gm/dl — ABNORMAL LOW (ref 3.4–5.0)
Alk. phosphatase: 83 U/L (ref 45–117)
BUN: 10 mg/dl (ref 7–25)
Bilirubin, total: 1 mg/dl (ref 0.2–1.0)
CO2: 23 mEq/L (ref 21–32)
Calcium: 8.6 mg/dl (ref 8.5–10.1)
Chloride: 109 mEq/L — ABNORMAL HIGH (ref 98–107)
Creatinine: 1.2 mg/dl (ref 0.6–1.3)
GFR est AA: >60
GFR est non-AA: 51
Glucose: 91 mg/dl (ref 74–106)
Potassium: 3.9 mEq/L (ref 3.5–5.1)
Protein, total: 6.6 gm/dl (ref 6.4–8.2)
Sodium: 140 mEq/L (ref 136–145)

## 2016-02-18 LAB — LIPASE: Lipase: 104 U/L (ref 73–393)

## 2016-02-18 LAB — LACTIC ACID: Lactic Acid: 0.7 mmol/L (ref 0.4–2.0)

## 2016-02-18 LAB — METABOLIC PANEL, BASIC
BUN: 7 mg/dl (ref 7–25)
CO2: 22 mEq/L (ref 21–32)
Calcium: 7.9 mg/dl — ABNORMAL LOW (ref 8.5–10.1)
Chloride: 114 mEq/L — ABNORMAL HIGH (ref 98–107)
Creatinine: 1 mg/dl (ref 0.6–1.3)
GFR est AA: >60
GFR est non-AA: >60
Glucose: 97 mg/dl (ref 74–106)
Potassium: 4.1 mEq/L (ref 3.5–5.1)
Sodium: 143 mEq/L (ref 136–145)

## 2016-02-18 LAB — ANTIBODY SCREEN: Antibody screen: NEGATIVE

## 2016-02-18 LAB — POC HCG,URINE: HCG urine, QL: NEGATIVE

## 2016-02-18 MED ORDER — LACTATED RINGERS IV
INTRAVENOUS | Status: AC
Start: 2016-02-18 — End: 2016-02-19
  Administered 2016-02-18 (×4): via INTRAVENOUS

## 2016-02-18 MED ORDER — METRONIDAZOLE IN SODIUM CHLORIDE (ISO-OSM) 500 MG/100 ML IV PIGGY BACK
500 mg/100 mL | INTRAVENOUS | Status: AC
Start: 2016-02-18 — End: 2016-02-18
  Administered 2016-02-18: 05:00:00 via INTRAVENOUS

## 2016-02-18 MED ORDER — ZOLPIDEM 5 MG TAB
5 mg | Freq: Every evening | ORAL | Status: DC | PRN
Start: 2016-02-18 — End: 2016-02-21

## 2016-02-18 MED ORDER — METRONIDAZOLE IN SODIUM CHLORIDE (ISO-OSM) 500 MG/100 ML IV PIGGY BACK
500 mg/100 mL | Freq: Three times a day (TID) | INTRAVENOUS | Status: DC
Start: 2016-02-18 — End: 2016-02-21
  Administered 2016-02-18 – 2016-02-21 (×10): via INTRAVENOUS

## 2016-02-18 MED ORDER — ACETAMINOPHEN 325 MG TABLET
325 mg | Freq: Four times a day (QID) | ORAL | Status: DC | PRN
Start: 2016-02-18 — End: 2016-02-21

## 2016-02-18 MED ORDER — ALBUTEROL SULFATE HFA 90 MCG/ACTUATION AEROSOL INHALER
90 mcg/actuation | Freq: Four times a day (QID) | RESPIRATORY_TRACT | Status: DC | PRN
Start: 2016-02-18 — End: 2016-02-17

## 2016-02-18 MED ORDER — MORPHINE 4 MG/ML SYRINGE
4 mg/mL | INTRAMUSCULAR | Status: DC | PRN
Start: 2016-02-18 — End: 2016-02-18

## 2016-02-18 MED ORDER — TOPIRAMATE 100 MG TAB
100 mg | Freq: Every evening | ORAL | Status: DC
Start: 2016-02-18 — End: 2016-02-21
  Administered 2016-02-18 – 2016-02-21 (×4): via ORAL

## 2016-02-18 MED ORDER — LEVOFLOXACIN IN D5W 750 MG/150 ML IV PIGGY BACK
750 mg/150 mL | Freq: Once | INTRAVENOUS | Status: DC
Start: 2016-02-18 — End: 2016-02-17
  Administered 2016-02-18: 02:00:00 via INTRAVENOUS

## 2016-02-18 MED ORDER — DICYCLOMINE 10 MG CAP
10 mg | Freq: Two times a day (BID) | ORAL | Status: DC | PRN
Start: 2016-02-18 — End: 2016-02-19
  Administered 2016-02-18 – 2016-02-19 (×3): via ORAL

## 2016-02-18 MED ORDER — CIPROFLOXACIN IN D5W 400 MG/200 ML IV PIGGY BACK
400 mg/200 mL | Freq: Two times a day (BID) | INTRAVENOUS | Status: DC
Start: 2016-02-18 — End: 2016-02-21
  Administered 2016-02-18 – 2016-02-21 (×7): via INTRAVENOUS

## 2016-02-18 MED ORDER — BUPROPION XL 300 MG 24 HR TAB
300 mg | ORAL | Status: DC
Start: 2016-02-18 — End: 2016-02-21
  Administered 2016-02-18 – 2016-02-21 (×5): via ORAL

## 2016-02-18 MED ORDER — SODIUM CHLORIDE 0.9% BOLUS IV
0.9 % | INTRAVENOUS | Status: AC
Start: 2016-02-18 — End: 2016-02-17
  Administered 2016-02-18: 02:00:00 via INTRAVENOUS

## 2016-02-18 MED ORDER — CIPROFLOXACIN IN D5W 400 MG/200 ML IV PIGGY BACK
400 mg/200 mL | INTRAVENOUS | Status: AC
Start: 2016-02-18 — End: 2016-02-17
  Administered 2016-02-18: 03:00:00 via INTRAVENOUS

## 2016-02-18 MED ORDER — MORPHINE 4 MG/ML SYRINGE
4 mg/mL | INTRAMUSCULAR | Status: DC | PRN
Start: 2016-02-18 — End: 2016-02-21

## 2016-02-18 MED ORDER — PROMETHAZINE 25 MG RECTAL SUPPOSITORY
25 mg | Freq: Four times a day (QID) | RECTAL | Status: DC | PRN
Start: 2016-02-18 — End: 2016-02-21

## 2016-02-18 MED ORDER — GABAPENTIN 300 MG CAP
300 mg | Freq: Two times a day (BID) | ORAL | Status: DC
Start: 2016-02-18 — End: 2016-02-21
  Administered 2016-02-18 – 2016-02-21 (×8): via ORAL

## 2016-02-18 MED ORDER — PROMETHAZINE 25 MG TAB
25 mg | Freq: Four times a day (QID) | ORAL | Status: DC | PRN
Start: 2016-02-18 — End: 2016-02-21
  Administered 2016-02-18 – 2016-02-21 (×4): via ORAL

## 2016-02-18 MED ORDER — ACETAMINOPHEN 1,000 MG/100 ML (10 MG/ML) IV
1000 mg/100 mL (10 mg/mL) | Freq: Three times a day (TID) | INTRAVENOUS | Status: DC | PRN
Start: 2016-02-18 — End: 2016-02-21
  Administered 2016-02-20 (×2): via INTRAVENOUS

## 2016-02-18 MED ORDER — ACETAMINOPHEN 325 MG TABLET
325 mg | ORAL | Status: AC
Start: 2016-02-18 — End: 2016-02-17
  Administered 2016-02-18: 03:00:00 via ORAL

## 2016-02-18 MED ORDER — ALBUTEROL SULFATE 0.083 % (0.83 MG/ML) SOLN FOR INHALATION
2.5 mg /3 mL (0.083 %) | Freq: Four times a day (QID) | RESPIRATORY_TRACT | Status: DC | PRN
Start: 2016-02-18 — End: 2016-02-21

## 2016-02-18 MED ORDER — ALUM-MAG HYDROXIDE-SIMETH 200 MG-200 MG-20 MG/5 ML ORAL SUSP
200-200-20 mg/5 mL | ORAL | Status: DC | PRN
Start: 2016-02-18 — End: 2016-02-21

## 2016-02-18 MED ORDER — ONDANSETRON (PF) 4 MG/2 ML INJECTION
4 mg/2 mL | Freq: Four times a day (QID) | INTRAMUSCULAR | Status: DC | PRN
Start: 2016-02-18 — End: 2016-02-21
  Administered 2016-02-18 – 2016-02-20 (×4): via INTRAVENOUS

## 2016-02-18 MED FILL — PROMETHAZINE 25 MG TAB: 25 mg | ORAL | Qty: 1

## 2016-02-18 MED FILL — CIPROFLOXACIN IN D5W 400 MG/200 ML IV PIGGY BACK: 400 mg/200 mL | INTRAVENOUS | Qty: 200

## 2016-02-18 MED FILL — GABAPENTIN 300 MG CAP: 300 mg | ORAL | Qty: 2

## 2016-02-18 MED FILL — METRONIDAZOLE IN SODIUM CHLORIDE (ISO-OSM) 500 MG/100 ML IV PIGGY BACK: 500 mg/100 mL | INTRAVENOUS | Qty: 100

## 2016-02-18 MED FILL — ACETAMINOPHEN 325 MG TABLET: 325 mg | ORAL | Qty: 3

## 2016-02-18 MED FILL — DICYCLOMINE 10 MG CAP: 10 mg | ORAL | Qty: 1

## 2016-02-18 MED FILL — ONDANSETRON (PF) 4 MG/2 ML INJECTION: 4 mg/2 mL | INTRAMUSCULAR | Qty: 2

## 2016-02-18 MED FILL — LACTATED RINGERS IV: INTRAVENOUS | Qty: 1000

## 2016-02-18 MED FILL — TOPIRAMATE 100 MG TAB: 100 mg | ORAL | Qty: 1

## 2016-02-18 MED FILL — BUPROPION XL 300 MG 24 HR TAB: 300 mg | ORAL | Qty: 1

## 2016-02-18 NOTE — Progress Notes (Signed)
Hospitalist Progress Note         [facility]    Daily Progress Note: 02/18/2016    Assessment/Plan:  1. Acute Colitis w H/O Bloody Diarrhea. On ABx. Stool studies pending.  2. Celiac Dz, IBS. Sees Dr. Maurene Capes  3. Fibromyalgia  4. Depression  5. H/O Migraine HA  6. DVT proph w SCDS. (No AC given Hematechezia)       Subjective:   Less abd pain. Mild nausea, no vomiting. Diarrhea has slowed down. No bloody diarrhea since last night.      General ROS: negative for - fatigue, fever or malaise  Respiratory ROS: no cough, shortness of breath,pleutic chest pain or wheezing  Cardiovascular ROS: no chest pain , dyspnea on exertion, orthopnea  Genito-Urinary ROS: no dysuria, trouble voiding, or hematuria    Objective:   Physical Exam:     Visit Vitals   ??? BP 103/63 (BP 1 Location: Right arm, BP Patient Position: Supine)   ??? Pulse 90   ??? Temp 98.4 ??F (36.9 ??C)   ??? Resp 20   ??? Ht 4' 11.5" (1.511 m)   ??? Wt 77.1 kg (170 lb)   ??? LMP 08/17/2015   ??? SpO2 97%   ??? BMI 33.76 kg/m2      O2 Device: Room air    Temp (24hrs), Avg:98.8 ??F (37.1 ??C), Min:98 ??F (36.7 ??C), Max:99.9 ??F (37.7 ??C)    01/14 0701 - 01/14 1900  In: 110 [P.O.:110]  Out: 300 [Urine:300]   01/12 1901 - 01/14 0700  In: 802.1 [I.V.:802.1]  Out: -     General:  Alert, cooperative, no distress, appears stated age.   Lungs:   Clear to auscultation bilaterally.    :     Heart:  Regular rate and rhythm, S1, S2 normal, no murmur, click, rub or gallop.   Abdomen:   Soft, diffuse tenderness   Extremities:  FROM, no LE edema   Pulses: 2+ and symmetric all extremities.   Skin: Skin color, texture, turgor normal. No rashes or lesions   :      Data Review:       24 Hour Results:  Recent Results (from the past 24 hour(s))   CBC WITH AUTOMATED DIFF    Collection Time: 02/17/16  6:36 PM   Result Value Ref Range    WBC 18.9 (H) 4.0 - 11.0 1000/mm3    RBC 4.44 3.60 - 5.20 M/uL    HGB 14.3 13.0 - 17.2 gm/dl    HCT 41.9 37.0 - 50.0 %    MCV 94.4 80.0 - 98.0 fL     MCH 32.2 25.4 - 34.6 pg    MCHC 34.1 30.0 - 36.0 gm/dl    PLATELET 222 140 - 450 1000/mm3    MPV 10.8 (H) 6.0 - 10.0 fL    RDW-SD 46.5 (H) 36.4 - 46.3      NRBC 0 0 - 0      IMMATURE GRANULOCYTES 0.6 0.0 - 3.0 %    NEUTROPHILS 80.6 (H) 34 - 64 %    LYMPHOCYTES 8.1 (L) 28 - 48 %    MONOCYTES 9.8 1 - 13 %    EOSINOPHILS 0.6 0 - 5 %    BASOPHILS 0.3 0 - 3 %   METABOLIC PANEL, COMPREHENSIVE    Collection Time: 02/17/16  6:36 PM   Result Value Ref Range    Sodium 140 136 - 145 mEq/L    Potassium 3.9 3.5 - 5.1  mEq/L    Chloride 109 (H) 98 - 107 mEq/L    CO2 23 21 - 32 mEq/L    Glucose 91 74 - 106 mg/dl    BUN 10 7 - 25 mg/dl    Creatinine 1.2 0.6 - 1.3 mg/dl    GFR est AA >60.0      GFR est non-AA 51      Calcium 8.6 8.5 - 10.1 mg/dl    AST (SGOT) 8 (L) 15 - 37 U/L    ALT (SGPT) 23 12 - 78 U/L    Alk. phosphatase 83 45 - 117 U/L    Bilirubin, total 1.0 0.2 - 1.0 mg/dl    Protein, total 6.6 6.4 - 8.2 gm/dl    Albumin 2.9 (L) 3.4 - 5.0 gm/dl   LIPASE    Collection Time: 02/17/16  6:36 PM   Result Value Ref Range    Lipase 104 73 - 393 U/L   BLOOD TYPE, (ABO+RH)    Collection Time: 02/17/16  6:36 PM   Result Value Ref Range    ABO/Rh(D) A Rh Positive     ANTIBODY SCREEN    Collection Time: 02/17/16  6:36 PM   Result Value Ref Range    Antibody screen NEG     POC CHEM8    Collection Time: 02/17/16  6:37 PM   Result Value Ref Range    Sodium 138 136 - 145 mEq/L    Potassium 4.0 3.5 - 4.9 mEq/L    Chloride 108 (H) 98 - 107 mEq/L    CO2, TOTAL 20 (L) 21 - 32 mmol/L    Glucose 97 74 - 106 mg/dL    BUN 10 7 - 25 mg/dl    Creatinine 1.1 0.6 - 1.3 mg/dl    HCT 45 38 - 45 %    HGB 15.3 13.0 - 17.2 gm/dl    CALCIUM,IONIZED 4.50 4.40 - 5.40 mg/dL   LACTIC ACID    Collection Time: 02/17/16  7:20 PM   Result Value Ref Range    Lactic Acid 0.7 0.4 - 2.0 mmol/L   POC URINE MACROSCOPIC    Collection Time: 02/17/16  8:00 PM   Result Value Ref Range    Glucose Negative NEGATIVE,Negative mg/dl    Bilirubin Negative NEGATIVE,Negative       Ketone Negative NEGATIVE,Negative mg/dl    Specific gravity >=1.030 1.005 - 1.030      Blood Trace-lysed (A) NEGATIVE,Negative      pH (UA) 7.0 5 - 9      Protein Negative NEGATIVE,Negative mg/dl    Urobilinogen 0.2 0.0 - 1.0 EU/dl    Nitrites Negative NEGATIVE,Negative      Leukocyte Esterase Negative NEGATIVE,Negative      Color Yellow      Appearance Clear     POC HCG,URINE    Collection Time: 02/17/16  8:07 PM   Result Value Ref Range    HCG urine, Ql. negative NEGATIVE,Negative,negative     CULTURE, BLOOD    Collection Time: 02/17/16  9:30 PM   Result Value Ref Range    Blood Culture Result Culture In Progress, Daily Updates To Follow     CULTURE, BLOOD    Collection Time: 02/17/16 10:05 PM   Result Value Ref Range    Blood Culture Result Culture In Progress, Daily Updates To Follow     CBC WITH AUTOMATED DIFF    Collection Time: 02/18/16  6:25 AM   Result Value Ref Range  WBC 15.5 (H) 4.0 - 11.0 1000/mm3    RBC 3.80 3.60 - 5.20 M/uL    HGB 12.1 (L) 13.0 - 17.2 gm/dl    HCT 36.4 (L) 37.0 - 50.0 %    MCV 95.8 80.0 - 98.0 fL    MCH 31.8 25.4 - 34.6 pg    MCHC 33.2 30.0 - 36.0 gm/dl    PLATELET 193 140 - 450 1000/mm3    MPV 11.2 (H) 6.0 - 10.0 fL    RDW-SD 47.4 (H) 36.4 - 46.3      NRBC 0 0 - 0      IMMATURE GRANULOCYTES 0.3 0.0 - 3.0 %    NEUTROPHILS 80.0 (H) 34 - 64 %    LYMPHOCYTES 10.2 (L) 28 - 48 %    MONOCYTES 8.2 1 - 13 %    EOSINOPHILS 0.8 0 - 5 %    BASOPHILS 0.5 0 - 3 %   METABOLIC PANEL, BASIC    Collection Time: 02/18/16  6:25 AM   Result Value Ref Range    Sodium 143 136 - 145 mEq/L    Potassium 4.1 3.5 - 5.1 mEq/L    Chloride 114 (H) 98 - 107 mEq/L    CO2 22 21 - 32 mEq/L    Glucose 97 74 - 106 mg/dl    BUN 7 7 - 25 mg/dl    Creatinine 1.0 0.6 - 1.3 mg/dl    GFR est AA >60.0      GFR est non-AA >60      Calcium 7.9 (L) 8.5 - 10.1 mg/dl   MAGNESIUM    Collection Time: 02/18/16  6:25 AM   Result Value Ref Range    Magnesium 1.9 1.6 - 2.6 mg/dl       Problem List:   Problem List as of 02/18/2016  Date Reviewed: 06-25-14          Codes Class Noted - Resolved    Colitis ICD-10-CM: K52.9  ICD-9-CM: 558.9  02/17/2016 - Present        Rectal bleeding ICD-10-CM: K62.5  ICD-9-CM: 569.3  02/17/2016 - Present               Medications reviewed  Current Facility-Administered Medications   Medication Dose Route Frequency   ??? alum-mag hydroxide-simeth (MYLANTA) oral suspension 30 mL  30 mL Oral Q4H PRN   ??? lactated Ringers infusion  125 mL/hr IntraVENous CONTINUOUS   ??? morphine injection 2 mg  2 mg IntraVENous Q3H PRN   ??? promethazine (PHENERGAN) tablet 25 mg  25 mg Oral Q6H PRN   ??? ondansetron (ZOFRAN) injection 4 mg  4 mg IntraVENous Q6H PRN   ??? promethazine (PHENERGAN) suppository 25 mg  25 mg Rectal Q6H PRN   ??? acetaminophen (TYLENOL) tablet 650 mg  650 mg Oral Q6H PRN   ??? acetaminophen (OFIRMEV) infusion 1,000 mg  1,000 mg IntraVENous Q8H PRN   ??? morphine injection 2 mg  2 mg IntraVENous Q3H PRN   ??? ciprofloxacin (CIPRO) 400 mg IVPB (premix)  400 mg IntraVENous Q12H   ??? metroNIDAZOLE (FLAGYL) IVPB premix 500 mg  500 mg IntraVENous Q8H   ??? zolpidem (AMBIEN) tablet 5 mg  5 mg Oral QHS PRN   ??? gabapentin (NEURONTIN) capsule 600 mg  600 mg Oral BID   ??? dicyclomine (BENTYL) capsule 10 mg  10 mg Oral BID PRN   ??? topiramate (TOPAMAX) tablet 100 mg  100 mg Oral QHS   ??? buPROPion XL [facility]  XL) tablet 300 mg  300 mg Oral 7am   ??? albuterol (PROVENTIL VENTOLIN) nebulizer solution 2.5 mg  2.5 mg Nebulization Q6H PRN        Care Plan discussed with: pt    Total time spent with patient: 25 minutes.    Carlean Jews, MD  February 18, 2016  12:19 PM

## 2016-02-18 NOTE — Progress Notes (Signed)
Bedside and Verbal shift change report given to .me   (oncoming nurse) by Elana Alm RN (offgoing nurse). Report included the following information SBAR and Kardex.

## 2016-02-18 NOTE — Progress Notes (Signed)
Problem: Pain  Goal: *Control of Pain  Outcome: Progressing Towards Goal  Pain control education, relaxation techniques used

## 2016-02-19 LAB — CBC WITH AUTOMATED DIFF
BASOPHILS: 0.6 % (ref 0–3)
EOSINOPHILS: 1.2 % (ref 0–5)
HCT: 33 % — ABNORMAL LOW (ref 37.0–50.0)
HGB: 10.8 gm/dl — ABNORMAL LOW (ref 13.0–17.2)
IMMATURE GRANULOCYTES: 0.4 % (ref 0.0–3.0)
LYMPHOCYTES: 16.6 % — ABNORMAL LOW (ref 28–48)
MCH: 31.7 pg (ref 25.4–34.6)
MCHC: 32.7 gm/dl (ref 30.0–36.0)
MCV: 96.8 fL (ref 80.0–98.0)
MONOCYTES: 9.7 % (ref 1–13)
MPV: 11 fL — ABNORMAL HIGH (ref 6.0–10.0)
NEUTROPHILS: 71.5 % — ABNORMAL HIGH (ref 34–64)
NRBC: 0 (ref 0–0)
PLATELET: 181 10*3/uL (ref 140–450)
RBC: 3.41 M/uL — ABNORMAL LOW (ref 3.60–5.20)
RDW-SD: 47.8 — ABNORMAL HIGH (ref 36.4–46.3)
WBC: 14.4 10*3/uL — ABNORMAL HIGH (ref 4.0–11.0)

## 2016-02-19 LAB — METABOLIC PANEL, BASIC
BUN: 4 mg/dl — ABNORMAL LOW (ref 7–25)
CO2: 18 mEq/L — ABNORMAL LOW (ref 21–32)
Calcium: 8.3 mg/dl — ABNORMAL LOW (ref 8.5–10.1)
Chloride: 115 mEq/L — ABNORMAL HIGH (ref 98–107)
Creatinine: 1.1 mg/dl (ref 0.6–1.3)
GFR est AA: >60
GFR est non-AA: 57
Glucose: 93 mg/dl (ref 74–106)
Potassium: 4 mEq/L (ref 3.5–5.1)
Sodium: 142 mEq/L (ref 136–145)

## 2016-02-19 LAB — VITAMIN B12: Vitamin B12: 341 pg/ml (ref 193–986)

## 2016-02-19 LAB — FERRITIN: Ferritin: 200.3 ng/ml (ref 8.0–252.0)

## 2016-02-19 LAB — FOLATE: Folate: 5.4 ng/ml (ref 3.1–17.5)

## 2016-02-19 LAB — C. DIFFICILE/EPI PCR: C. diff toxin by PCR: NEGATIVE

## 2016-02-19 LAB — TIBC: TIBC: 318 ug/dL (ref 250–450)

## 2016-02-19 LAB — IRON: Iron: 24 ug/dL — ABNORMAL LOW (ref 50–170)

## 2016-02-19 MED ORDER — SODIUM CHLORIDE 0.9 % IV
INTRAVENOUS | Status: DC
Start: 2016-02-19 — End: 2016-02-21
  Administered 2016-02-19 – 2016-02-21 (×4): via INTRAVENOUS

## 2016-02-19 MED ORDER — DICYCLOMINE 10 MG CAP
10 mg | Freq: Three times a day (TID) | ORAL | Status: DC
Start: 2016-02-19 — End: 2016-02-19
  Administered 2016-02-19: 23:00:00 via ORAL

## 2016-02-19 MED FILL — SODIUM CHLORIDE 0.9 % IV: INTRAVENOUS | Qty: 1000

## 2016-02-19 MED FILL — DICYCLOMINE 10 MG CAP: 10 mg | ORAL | Qty: 1

## 2016-02-19 MED FILL — ONDANSETRON (PF) 4 MG/2 ML INJECTION: 4 mg/2 mL | INTRAMUSCULAR | Qty: 2

## 2016-02-19 MED FILL — METRONIDAZOLE IN SODIUM CHLORIDE (ISO-OSM) 500 MG/100 ML IV PIGGY BACK: 500 mg/100 mL | INTRAVENOUS | Qty: 100

## 2016-02-19 MED FILL — TOPIRAMATE 100 MG TAB: 100 mg | ORAL | Qty: 1

## 2016-02-19 MED FILL — GABAPENTIN 300 MG CAP: 300 mg | ORAL | Qty: 2

## 2016-02-19 MED FILL — ACETAMINOPHEN 325 MG TABLET: 325 mg | ORAL | Qty: 2

## 2016-02-19 MED FILL — PROMETHAZINE 25 MG TAB: 25 mg | ORAL | Qty: 1

## 2016-02-19 MED FILL — CIPROFLOXACIN IN D5W 400 MG/200 ML IV PIGGY BACK: 400 mg/200 mL | INTRAVENOUS | Qty: 200

## 2016-02-19 MED FILL — BUPROPION XL 300 MG 24 HR TAB: 300 mg | ORAL | Qty: 1

## 2016-02-19 NOTE — Consults (Signed)
Consults  by Hilma Favors, MD at 02/19/16 1126                Author: Hilma Favors, MD  Service: Gastroenterology  Author Type: Physician       Filed: 02/19/16 2113  Date of Service: 02/19/16 1126  Status: Addendum          Editor: Hilma Favors, MD (Physician)          Related Notes: Original Note by Colon Flattery filed at 02/19/16 1221            Consult Orders        1. IP CONSULT TO PHYSICIAN [[id number]] ordered by Carlean Jews, MD at 02/19/16 1110                                         Gastroenterology Consult          Patient: [name] [name]  MRN: [id number]   SSN: [id number]          Date of Birth: 01/08/1971   Age: 46 y.o.   Sex: female              Assessment:        1.  Acute Diarrheal Illness/Colitis of the transverse and descending colon with hematochezia - ddx: infectious, ischemic/low flow, inflammatory, ibs with hemorrhoids, etc.   Entire clinical picture almost certainly related to ISCHEMIC COLITIS   1.  C.Diff negative   2.  CT abd/pelvis 02/17/16: 1. Colitis either inflammatory or infectious (tranverse and descending colon). 2. 3 mm nonobstructing  left kidney stone. 3. IUD.   3.  Last colonoscopy was 07/02/15 by Dr Delma Freeze and was normal.   2.  Leukocytosis, 2/2 to above, trending down   1.  WBC 18.9 >> 15.5 >> 14.4   2.  Blood Cultures with no growth at 24 hours.   3.  Normocytic Anemia   4.  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 Diagnosis made in 2007 based on histologic and serologic abnormalities noted in 2007.   5.  IBS-Diarrhea predominant - controlled with  Viberzi 133m BID and dicyclomine 112m1/2 hour ACTID/prn as outpatient (last dose of Viberzi was 02/16/16).   6.  Fibromyalgia   7.  Depression   8.  Hx of Migraine Headaches   9.  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 On Caltrate as outpatient.        Plan:        1.  Continue antibiotics (Cipro and Flagyl)   2.  Change Bentyl to 2029m1 hour  AC TID   3.  Check iron studies   4.  Order Mesenteric PVLs   5.  No plans for repeat colonoscopy unless clinical course becomes confusing   6.  Continue strict gluten free diet.   7.  Further recommendations to follow/per Attending.         Attending Note:  I have independently interviewed and examined the patient and formulated tha above assessment and plan.  Almost certainly patient has Ischemic Colitis.        Subjective:         Chief Complaint:      Chief Complaint       Patient presents with        ?  Abdominal Pain     ?  Diarrhea        ?  Dizziness           [name] [name] is a 46 y.o. female patient of Dr Delma Freeze with a hx of celiac disease and IBS-D, who is being seen for  evaluation of acute diarrheal illness/colitis with associated periumbilical pain, nausea, low grade fever and hematochezia. This began  on the evening of 02/16/16. No recent abx use prior to admission, recent travel, sick contacts. She has had a change in her gabapentin dose and started on topamax by a neurologist for migraines. Patient has recently eaten sushi. She has responded well  to abx therapy with flagyl and cipro which was started on 02/18/16, in that her hematochezia has resolved, abdominal pain improving but diarrhea with urgency persists. WBCs trending down. Per her hx her last episode of colitis was about 13 yrs ago. Last  colonoscopy was 07/02/15 and was normal. Stool for culture and O&P has been ordered but not done.          Hospital Problems   Date Reviewed:  Jun 06, 2014                Codes  Class  Noted  POA              Colitis  ICD-10-CM: K52.9   ICD-9-CM: 558.9    02/17/2016  Unknown                        Rectal bleeding  ICD-10-CM: K62.5   ICD-9-CM: 569.3    02/17/2016  Unknown                         Past Medical History:        Diagnosis  Date         ?  Chronic kidney disease            stage 3         ?  Fibromyalgia           ?  Palate mass            Past Surgical History:         Procedure  Laterality   Date          ?  HX APPENDECTOMY         ?  HX CESAREAN SECTION         ?  HX HERNIA REPAIR              left          ?  HX OTHER SURGICAL              jaw surgery          ?  HX OVARIAN CYST REMOVAL         ?  HX SEPTOPLASTY              ?  HX TUBAL LIGATION               Family History         Problem  Relation  Age of Onset          ?  Kidney Disease  Mother       ?  Depression  Mother            ?  Heart Disease  Father  Social History       Substance Use Topics         ?  Smoking status:  Never Smoker     ?  Smokeless tobacco:  Not on file         ?  Alcohol use  No           Current Facility-Administered Medications             Medication  Dose  Route  Frequency  Provider  Last Rate  Last Dose              ?  0.9% sodium chloride infusion   125 mL/hr  IntraVENous  CONTINUOUS  Carlean Jews, MD  125 mL/hr at 02/19/16 1114  125 mL/hr at 02/19/16 1114     ?  alum-mag hydroxide-simeth (MYLANTA) oral suspension 30 mL   30 mL  Oral  Q4H PRN  Illene Regulus, MD           ?  promethazine (PHENERGAN) tablet 25 mg   25 mg  Oral  Q6H PRN  Illene Regulus, MD     25 mg at 02/18/16 2326     ?  ondansetron (ZOFRAN) injection 4 mg   4 mg  IntraVENous  Q6H PRN  Illene Regulus, MD     4 mg at 02/18/16 2204     ?  promethazine (PHENERGAN) suppository 25 mg   25 mg  Rectal  Q6H PRN  Illene Regulus, MD           ?  acetaminophen (TYLENOL) tablet 650 mg   650 mg  Oral  Q6H PRN  Illene Regulus, MD           ?  acetaminophen (OFIRMEV) infusion 1,000 mg   1,000 mg  IntraVENous  Q8H PRN  Illene Regulus, MD           ?  morphine injection 2 mg   2 mg  IntraVENous  Q3H PRN  Illene Regulus, MD           ?  ciprofloxacin (CIPRO) 400 mg IVPB (premix)   400 mg  IntraVENous  Q12H  Illene Regulus, MD  200 mL/hr at 02/19/16 0803  400 mg at 02/19/16 0803     ?  metroNIDAZOLE (FLAGYL) IVPB premix 500 mg   500 mg  IntraVENous  Q8H  Illene Regulus, MD  100 mL/hr at 02/19/16 0606   500 mg at 02/19/16 0606     ?  zolpidem (AMBIEN) tablet 5 mg   5 mg  Oral  QHS PRN  Illene Regulus, MD           ?  gabapentin (NEURONTIN) capsule 600 mg   600 mg  Oral  BID  Illene Regulus, MD     600 mg at 02/19/16 5176              ?  dicyclomine (BENTYL) capsule 10 mg   10 mg  Oral  BID PRN  Illene Regulus, MD     10 mg at 02/18/16 2326              ?  topiramate (TOPAMAX) tablet 100 mg   100 mg  Oral  QHS  Illene Regulus, MD     100 mg at 02/18/16 2204     ?  buPROPion XL (WELLBUTRIN XL) tablet 300 mg   300 mg  Oral  7am  Illene Regulus, MD     300 mg at 02/19/16 1191              ?  albuterol (PROVENTIL VENTOLIN) nebulizer solution 2.5 mg   2.5 mg  Nebulization  Q6H PRN  Illene Regulus, MD                    Allergies        Allergen  Reactions         ?  Augmentin [Amoxicillin-Pot Clavulanate]  Diarrhea         ?  Demerol [Meperidine]  Hives and Nausea and Vomiting           Review of Systems:   A comprehensive review of systems was negative except for that written in the History of Present Illness.        Objective:        Patient Vitals for the past 24 hrs:            Temp  Pulse  Resp  BP  SpO2     02/19/16 0759  98.3 ??F (36.8 ??C)  76  20  96/57  99 %     02/19/16 0317  98.4 ??F (36.9 ??C)  82  13  (!) 80/45  97 %     02/18/16 2325  98.5 ??F (36.9 ??C)  94  14  93/44  96 %     02/18/16 2003  -  98  16  103/61  96 %     02/18/16 1935  98.9 ??F (37.2 ??C)  (!) 107  18  (!) 89/74  98 %     02/18/16 1629  98.4 ??F (36.9 ??C)  91  20  98/56  99 %     02/18/16 1200  98.4 ??F (36.9 ??C)  90  20  103/63  97 %              Intake/Output Summary (Last 24 hours) at 02/19/16 1126  Last data filed at 02/18/16 2003        Gross per 24 hour     Intake           2157.88 ml     Output               300 ml     Net           1857.88 ml           Physical Exam:   GENERAL: alert, cooperative, no distress, appears stated age   LYMPHATIC: Cervical, supraclavicular, and axillary nodes normal.    THROAT &  NECK: normal and no erythema or exudates noted.    LUNG: clear to auscultation bilaterally   HEART: regular rate and rhythm, S1, S2 normal, no murmur, click, rub or gallop   ABDOMEN: soft, diffuse TTP throughout abdomen, Bowel sounds normal. No masses,  no organomegaly   EXTREMITIES:  extremities normal, atraumatic, no cyanosis or edema   NEUROLOGIC: negative      Diagnostic Imaging:   No results found.         Laboratory Results:         Labs:  Results:        Chemistry  Recent Labs          02/19/16    0556   02/18/16  9562   02/17/16    1837   02/17/16    1836      GLU   93   97   97   91      NA   142   143   138   140      K   4.0   4.1   4.0   3.9      CL   115*   114*   108*   109*      CO2   18*   22   20*   23      BUN   4*   '7   10   10      ' CREA   1.1   1.0   1.1   1.2      CA   8.3*   7.9*    --    8.6      AP    --     --     --    83      TP    --     --     --    6.6      ALB    --     --     --    2.9*          Estimated Creatinine Clearance: 60.5 mL/min (based on Cr of 1.1).     CBC w/Diff  Recent Labs          02/19/16    0556   02/18/16    0625   02/17/16    1837   02/17/16    1836      WBC   14.4*   15.5*    --    18.9*      RBC   3.41*   3.80    --    4.44      HGB   10.8*   12.1*   15.3   14.3      HCT   33.0*   36.4*   45   41.9      PLT   181   193    --    222      GRANS   71.5*   80.0*    --    80.6*      LYMPH   16.6*   10.2*    --    8.1*      EOS   1.2   0.8    --    0.6              Cardiac Enzymes  No results for input(s): CPK, CKND1, MYO in the last 72 hours.      No lab exists for component: CKRMB, TROIP     Coagulation  No results for input(s): PTP, INR, APTT in the last 72 hours.      No lab exists for component: INREXT      Hepatitis Panel  No results found for: HAMAT, HAAB, HABT, HAAT, HBSAG, HBSB, HBSAT, HBABN, HBCM, HBCAB, HBCAT, XBCABS, HBEAB, HBEAG, XHEPCS, 130865, HBEGLT, HBCMLT, HBCLT,  HBEBLT, [id number], [id number], HAVMLT, 102725, HBCMLT, [id number], HCGAT     Amylase  Lipase          Liver Enzymes  Recent Labs          02/17/16    1836  TP   6.6      ALB   2.9*      AP   83      SGOT   8*      ALT   23                 Thyroid Studies  No results for input(s): T4, T3U, TSH, TSHEXT in the last 72 hours.      No lab exists for component: T3RU          Pathology  pathology                 Signed By:  Elana Alm. Bethann Humble       February 19, 2016           Office: [phone]

## 2016-02-19 NOTE — Consults (Addendum)
Gastroenterology Consult    Patient: [name] [name] MRN: [id number]  SSN: [id number]    Date of Birth: 12-11-1970  Age: 46 y.o.  Sex: female        Assessment:     1. Acute Diarrheal Illness/Colitis of the transverse and descending colon with hematochezia - ddx: infectious, ischemic/low flow, inflammatory, ibs with hemorrhoids, etc.  Entire clinical picture almost certainly related to ISCHEMIC COLITIS  1. C.Diff negative  2. CT abd/pelvis 02/17/16: 1. Colitis either inflammatory or infectious (tranverse and descending colon). 2. 3 mm nonobstructing left kidney stone. 3. IUD.  3. Last colonoscopy was 07/02/15 by Dr Delma Freeze and was normal.  2. Leukocytosis, 2/2 to above, trending down  1. WBC 18.9 >> 15.5 >> 14.4  2. Blood Cultures with no growth at 24 hours.  3. Normocytic Anemia  4. 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 Diagnosis made in 2007 based on histologic and serologic abnormalities noted in 2007.  5. IBS-Diarrhea predominant - controlled with  Viberzi 172m BID and dicyclomine 149m1/2 hour ACTID/prn as outpatient (last dose of Viberzi was 02/16/16).  6. Fibromyalgia  7. Depression  8. Hx of Migraine Headaches  9. 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 On Caltrate as outpatient.    Plan:     1. Continue antibiotics (Cipro and Flagyl)  2. Change Bentyl to 2027m hour  AC TID  3. Check iron studies  4. Order Mesenteric PVLs  5. No plans for repeat colonoscopy unless clinical course becomes confusing  6. Continue strict gluten free diet.  7. Further recommendations to follow/per Attending.       Attending Note:  I have independently interviewed and examined the patient and formulated tha above assessment and plan.  Almost certainly patient has Ischemic Colitis.    Subjective:      Chief Complaint:   Chief Complaint   Patient presents with   ??? Abdominal Pain   ??? Diarrhea   ??? Dizziness        [name] [name] a 45 1o. female patient of Dr TioDelma Freezeth a hx of celiac disease and IBS-D, who is being seen for evaluation of acute diarrheal illness/colitis with associated periumbilical pain, nausea, low grade fever and hematochezia. This began on the evening of 02/16/16. No recent abx use prior to admission, recent travel, sick contacts. She has had a change in her gabapentin dose and started on topamax by a neurologist for migraines. Patient has recently eaten sushi. She has responded well to abx therapy with flagyl and cipro which was started on 02/18/16, in that her hematochezia has resolved, abdominal pain improving but diarrhea with urgency persists. WBCs trending down. Per her hx her last episode of colitis was about 13 yrs ago. Last colonoscopy was 07/02/15 and was normal. Stool for culture and O&P has been ordered but not done.     Hospital Problems  Date Reviewed: 4/22016/05/13       Codes Class Noted POA    Colitis ICD-10-CM: K52.9  ICD-9-CM: 558.9  02/17/2016 Unknown        Rectal bleeding ICD-10-CM: K62.5  ICD-9-CM: 569.3  02/17/2016 Unknown            Past Medical History:   Diagnosis Date   ??? Chronic kidney disease     stage 3   ??? Fibromyalgia    ??? Palate mass      Past Surgical  History:   Procedure Laterality Date   ??? HX APPENDECTOMY     ??? HX CESAREAN SECTION     ??? HX HERNIA REPAIR      left   ??? HX OTHER SURGICAL      jaw surgery   ??? HX OVARIAN CYST REMOVAL     ??? HX SEPTOPLASTY     ??? HX TUBAL LIGATION        Family History   Problem Relation Age of Onset   ??? Kidney Disease Mother    ??? Depression Mother    ??? Heart Disease Father      Social History   Substance Use Topics   ??? Smoking status: Never Smoker   ??? Smokeless tobacco: Not on file   ??? Alcohol use No      Current Facility-Administered Medications   Medication Dose Route Frequency Provider Last Rate Last Dose    ??? 0.9% sodium chloride infusion  125 mL/hr IntraVENous CONTINUOUS Carlean Jews, MD 125 mL/hr at 02/19/16 1114 125 mL/hr at 02/19/16 1114   ??? alum-mag hydroxide-simeth (MYLANTA) oral suspension 30 mL  30 mL Oral Q4H PRN Illene Regulus, MD       ??? promethazine (PHENERGAN) tablet 25 mg  25 mg Oral Q6H PRN Illene Regulus, MD   25 mg at 02/18/16 2326   ??? ondansetron [facility]) injection 4 mg  4 mg IntraVENous Q6H PRN Illene Regulus, MD   4 mg at 02/18/16 2204   ??? promethazine (PHENERGAN) suppository 25 mg  25 mg Rectal Q6H PRN Illene Regulus, MD       ??? acetaminophen (TYLENOL) tablet 650 mg  650 mg Oral Q6H PRN Illene Regulus, MD       ??? acetaminophen (OFIRMEV) infusion 1,000 mg  1,000 mg IntraVENous Q8H PRN Illene Regulus, MD       ??? morphine injection 2 mg  2 mg IntraVENous Q3H PRN Illene Regulus, MD       ??? ciprofloxacin (CIPRO) 400 mg IVPB (premix)  400 mg IntraVENous Q12H Illene Regulus, MD 200 mL/hr at 02/19/16 0803 400 mg at 02/19/16 0803   ??? metroNIDAZOLE (FLAGYL) IVPB premix 500 mg  500 mg IntraVENous Q8H Illene Regulus, MD 100 mL/hr at 02/19/16 0606 500 mg at 02/19/16 0606   ??? zolpidem (AMBIEN) tablet 5 mg  5 mg Oral QHS PRN Illene Regulus, MD       ??? gabapentin (NEURONTIN) capsule 600 mg  600 mg Oral BID Illene Regulus, MD   600 mg at 02/19/16 0981   ??? dicyclomine (BENTYL) capsule 10 mg  10 mg Oral BID PRN Illene Regulus, MD   10 mg at 02/18/16 2326   ??? topiramate (TOPAMAX) tablet 100 mg  100 mg Oral QHS Illene Regulus, MD   100 mg at 02/18/16 2204   ??? buPROPion XL (WELLBUTRIN XL) tablet 300 mg  300 mg Oral 7am Illene Regulus, MD   300 mg at 02/19/16 0606   ??? albuterol (PROVENTIL VENTOLIN) nebulizer solution 2.5 mg  2.5 mg Nebulization Q6H PRN Illene Regulus, MD            Allergies   Allergen Reactions   ??? Augmentin [Amoxicillin-Pot Clavulanate] Diarrhea   ??? Demerol [Meperidine] Hives and Nausea and Vomiting        Review of Systems:  A  comprehensive review of systems was negative except for that written in the History of Present Illness.    Objective:     Patient Vitals for the past 24 hrs:   Temp Pulse Resp BP SpO2   02/19/16 0759 98.3 ??F (36.8 ??C) 76 20 96/57 99 %   02/19/16 0317 98.4 ??F (36.9 ??C) 82 13 (!) 80/45 97 %   02/18/16 2325 98.5 ??F (36.9 ??C) 94 14 93/44 96 %   02/18/16 2003 - 98 16 103/61 96 %   02/18/16 1935 98.9 ??F (37.2 ??C) (!) 107 18 (!) 89/74 98 %   02/18/16 1629 98.4 ??F (36.9 ??C) 91 20 98/56 99 %   02/18/16 1200 98.4 ??F (36.9 ??C) 90 20 103/63 97 %         Intake/Output Summary (Last 24 hours) at 02/19/16 1126  Last data filed at 02/18/16 2003   Gross per 24 hour   Intake          2157.88 ml   Output              300 ml   Net          1857.88 ml       Physical Exam:  GENERAL: alert, cooperative, no distress, appears stated age  LYMPHATIC: Cervical, supraclavicular, and axillary nodes normal.   THROAT & NECK: normal and no erythema or exudates noted.   LUNG: clear to auscultation bilaterally  HEART: regular rate and rhythm, S1, S2 normal, no murmur, click, rub or gallop  ABDOMEN: soft, diffuse TTP throughout abdomen, Bowel sounds normal. No masses,  no organomegaly  EXTREMITIES:  extremities normal, atraumatic, no cyanosis or edema  NEUROLOGIC: negative    Diagnostic Imaging:  No results found.      Laboratory Results:    Labs: Results:   Chemistry Recent Labs      02/19/16   0556  02/18/16   0625  02/17/16   1837  02/17/16   1836   GLU  93  97  97  91   NA  142  143  138  140   K  4.0  4.1  4.0  3.9   CL  115*  114*  108*  109*   CO2  18*  22  20*  23   BUN  4*  7  10  10    CREA  1.1  1.0  1.1  1.2   CA  8.3*  7.9*   --   8.6   AP   --    --    --   83   TP   --    --    --   6.6   ALB   --    --    --   2.9*    Estimated Creatinine Clearance: 60.5 mL/min (based on Cr of 1.1).   CBC w/Diff Recent Labs      02/19/16   0556  02/18/16   0625  02/17/16   1837  02/17/16   1836   WBC  14.4*  15.5*   --   18.9*    RBC  3.41*  3.80   --   4.44   HGB  10.8*  12.1*  15.3  14.3   HCT  33.0*  36.4*  45  41.9   PLT  181  193   --   222   GRANS  71.5*  80.0*   --   80.6*  LYMPH  16.6*  10.2*   --   8.1*   EOS  1.2  0.8   --   0.6      Cardiac Enzymes No results for input(s): CPK, CKND1, MYO in the last 72 hours.    No lab exists for component: CKRMB, TROIP   Coagulation No results for input(s): PTP, INR, APTT in the last 72 hours.    No lab exists for component: INREXT    Hepatitis Panel No results found for: HAMAT, HAAB, HABT, HAAT, HBSAG, HBSB, HBSAT, HBABN, HBCM, HBCAB, HBCAT, XBCABS, HBEAB, HBEAG, XHEPCS, [id number], HBEGLT, HBCMLT, HBCLT, HBEBLT, [id number], [id number], HAVMLT, [id number], HBCMLT, [id number], HCGAT   Amylase Lipase    Liver Enzymes Recent Labs      02/17/16   1836   TP  6.6   ALB  2.9*   AP  83   SGOT  8*   ALT  23      Thyroid Studies No results for input(s): T4, T3U, TSH, TSHEXT in the last 72 hours.    No lab exists for component: T3RU     Pathology pathology         Signed By: Elana Alm. Bethann Humble    February 19, 2016     Office: [phone]

## 2016-02-19 NOTE — Other (Deleted)
PHYSICIAN???S DOCUMENTATION REQUEST   This Form is Not a Permanent Document in the Medical Record         By submitting this query, we are merely seeking further clarification of documentation to accurately reflect all conditions that you are monitoring, evaluating, treating or that extend the hospitalization or utilize additional resources of care. Please utilize your independent clinical judgment when addressing the question(s) below.    Dear Dr. Romeo Apple,    Documentation by the ER on 1/13 states patient had diarrhea, and a few hours later, diarrhea with blood in her stools noting later stools were bloody with no stool mixed in. Rectal exam noted bright red blood per rectum.  The H/P documented 10 episodes of diarrhea.     Pertinent clinical findings include -  H/H  1/13- 1/15    14.3-->  12-->  10.8   Patient received 2L NS bolus for low BP in ER and has been on IVF at 125/h.   Diagnostics--1/13  CT of abd showed colitis.       Treatment includes type and screen,  Daily H/H   ............................................................................................................................................    Please clarify if there is a diagnosis associated with the above noted clinical information and/or data:    ?? The patient has a diagnosis of (please specify)  ?? The noted information is clinically insignificant at this time  ?? Unable to determine    If there is an associated diagnosis, please specify if present on admission (POA).  ?? Yes  ?? No  ?? Clinically Undetermined      PLEASE DOCUMENT ANY ADDITIONAL DIAGNOSES AND/OR SPECIFICITY IN THE PROGRESS NOTES AND/OR DISCHARGE SUMMARY.      Thank you,  Marzella Schlein. Madilyn Fireman, BSN, RN, CCM  Clinical Documentation Specialist  [employer]   Cell [phone]

## 2016-02-19 NOTE — Progress Notes (Signed)
Problem: Falls - Risk of  Goal: *Absence of Falls  Document Schmid Fall Risk and appropriate interventions in the flowsheet.   Outcome: Progressing Towards Goal  Fall Risk Interventions:  Mobility Interventions: Patient to call before getting OOB         Medication Interventions: Patient to call before getting OOB, Teach patient to arise slowly

## 2016-02-19 NOTE — Progress Notes (Signed)
Paged Dr. Romeo Apple BP trending low.

## 2016-02-19 NOTE — Progress Notes (Signed)
Bedside and Verbal shift change report given to Loistine Simas RN (oncoming nurse) by Victorino Dike RN (offgoing nurse). Report included the following information SBAR and Kardex.

## 2016-02-19 NOTE — Progress Notes (Signed)
D/C Plan Home - No anticipated needs at this time    Anticipated date of D/C -  02/21/16    Pharmacy - [facility]     DME- None     Home Health- None     Care Management Interventions  PCP Verified by CM: Yes  Last Visit to PCP: 02/13/16  Mode of Transport at Discharge: Other (see comment) (Husband )  MyChart Signup: No  Discharge Durable Medical Equipment: No  Physical Therapy Consult: No  Occupational Therapy Consult: No  Speech Therapy Consult: No  Current Support Network: Lives with Spouse, Family Lives Nearby  Confirm Follow Up Transport: Family  Confirm Transport and Arrange: Yes  Plan discussed with Pt/Family/Caregiver: Yes  Discharge Location  Discharge Placement: Home with family assistance

## 2016-02-19 NOTE — Progress Notes (Addendum)
Hospitalist Progress Note         [facility]    Daily Progress Note: 02/19/2016    Assessment/Plan:  1. Acute Colitis w H/O Bloody Diarrhea. On ABx. Stool studies pending. Improving. GI to see, probably needs colo when cooled down as an outpt  2. Celiac Dz, IBS. Sees Dr. Alona Bene  3. Acute Blood loss Anemia from LGIB, H/H stable  4. Fibromyalgia  5. Depression  6. H/O Migraine HA  7. DVT proph w SCDS. (No AC given Hematechezia)       Subjective:   Less abd pain. Mild nausea, no vomiting. Diarrhea has slowed down. No more bloody diarrhea.      General ROS: negative for - fatigue, fever or malaise  Respiratory ROS: no cough, shortness of breath,pleutic chest pain or wheezing  Cardiovascular ROS: no chest pain , dyspnea on exertion, orthopnea  Genito-Urinary ROS: no dysuria, trouble voiding, or hematuria    Objective:   Physical Exam:     Visit Vitals   ??? BP 96/57 (BP 1 Location: Left arm, BP Patient Position: Supine)   ??? Pulse 76   ??? Temp 98.3 ??F (36.8 ??C)   ??? Resp 20   ??? Ht 4' 11.5" (1.511 m)   ??? Wt 77.1 kg (170 lb)   ??? LMP 08/17/2015   ??? SpO2 99%   ??? BMI 33.76 kg/m2      O2 Device: Room air    Temp (24hrs), Avg:98.5 ??F (36.9 ??C), Min:98.3 ??F (36.8 ??C), Max:98.9 ??F (37.2 ??C)        01/13 1901 - 01/15 0700  In: 3070 [P.O.:110; I.V.:2960]  Out: 600 [Urine:600]    General:  Alert, cooperative, no distress, appears stated age.   Lungs:   Clear to auscultation bilaterally.    :     Heart:  Regular rate and rhythm, S1, S2 normal, no murmur, click, rub or gallop.   Abdomen:   Soft, less diffuse tenderness   Extremities:  FROM, no LE edema   Pulses: 2+ and symmetric all extremities.   Skin: Skin color, texture, turgor normal. No rashes or lesions   :      Data Review:       24 Hour Results:  Recent Results (from the past 24 hour(s))   CBC WITH AUTOMATED DIFF    Collection Time: 02/19/16  5:56 AM   Result Value Ref Range    WBC 14.4 (H) 4.0 - 11.0 1000/mm3    RBC 3.41 (L) 3.60 - 5.20 M/uL     HGB 10.8 (L) 13.0 - 17.2 gm/dl    HCT 24.0 (L) 97.3 - 50.0 %    MCV 96.8 80.0 - 98.0 fL    MCH 31.7 25.4 - 34.6 pg    MCHC 32.7 30.0 - 36.0 gm/dl    PLATELET 532 992 - 426 1000/mm3    MPV 11.0 (H) 6.0 - 10.0 fL    RDW-SD 47.8 (H) 36.4 - 46.3      NRBC 0 0 - 0      IMMATURE GRANULOCYTES 0.4 0.0 - 3.0 %    NEUTROPHILS 71.5 (H) 34 - 64 %    LYMPHOCYTES 16.6 (L) 28 - 48 %    MONOCYTES 9.7 1 - 13 %    EOSINOPHILS 1.2 0 - 5 %    BASOPHILS 0.6 0 - 3 %   METABOLIC PANEL, BASIC    Collection Time: 02/19/16  5:56 AM   Result Value Ref Range  Sodium 142 136 - 145 mEq/L    Potassium 4.0 3.5 - 5.1 mEq/L    Chloride 115 (H) 98 - 107 mEq/L    CO2 18 (L) 21 - 32 mEq/L    Glucose 93 74 - 106 mg/dl    BUN 4 (L) 7 - 25 mg/dl    Creatinine 1.1 0.6 - 1.3 mg/dl    GFR est AA >16.1      GFR est non-AA 57      Calcium 8.3 (L) 8.5 - 10.1 mg/dl       Problem List:  Problem List as of 02/19/2016  Date Reviewed: June 03, 2014          Codes Class Noted - Resolved    Colitis ICD-10-CM: K52.9  ICD-9-CM: 558.9  02/17/2016 - Present        Rectal bleeding ICD-10-CM: K62.5  ICD-9-CM: 569.3  02/17/2016 - Present               Medications reviewed  Current Facility-Administered Medications   Medication Dose Route Frequency   ??? 0.9% sodium chloride infusion  125 mL/hr IntraVENous CONTINUOUS   ??? alum-mag hydroxide-simeth (MYLANTA) oral suspension 30 mL  30 mL Oral Q4H PRN   ??? promethazine (PHENERGAN) tablet 25 mg  25 mg Oral Q6H PRN   ??? ondansetron (ZOFRAN) injection 4 mg  4 mg IntraVENous Q6H PRN   ??? promethazine (PHENERGAN) suppository 25 mg  25 mg Rectal Q6H PRN   ??? acetaminophen (TYLENOL) tablet 650 mg  650 mg Oral Q6H PRN   ??? acetaminophen (OFIRMEV) infusion 1,000 mg  1,000 mg IntraVENous Q8H PRN   ??? morphine injection 2 mg  2 mg IntraVENous Q3H PRN   ??? ciprofloxacin (CIPRO) 400 mg IVPB (premix)  400 mg IntraVENous Q12H   ??? metroNIDAZOLE (FLAGYL) IVPB premix 500 mg  500 mg IntraVENous Q8H   ??? zolpidem (AMBIEN) tablet 5 mg  5 mg Oral QHS PRN    ??? gabapentin (NEURONTIN) capsule 600 mg  600 mg Oral BID   ??? dicyclomine (BENTYL) capsule 10 mg  10 mg Oral BID PRN   ??? topiramate (TOPAMAX) tablet 100 mg  100 mg Oral QHS   ??? buPROPion XL (WELLBUTRIN XL) tablet 300 mg  300 mg Oral 7am   ??? albuterol (PROVENTIL VENTOLIN) nebulizer solution 2.5 mg  2.5 mg Nebulization Q6H PRN        Care Plan discussed with: pt    Total time spent with patient: 25 minutes.    Dorothe Pea, MD  February 19, 2016  1105

## 2016-02-20 LAB — CBC WITH AUTOMATED DIFF
BASOPHILS: 0.4 % (ref 0–3)
EOSINOPHILS: 2.8 % (ref 0–5)
HCT: 37.2 % (ref 37.0–50.0)
HGB: 12.3 gm/dl — ABNORMAL LOW (ref 13.0–17.2)
IMMATURE GRANULOCYTES: 0.3 % (ref 0.0–3.0)
LYMPHOCYTES: 13.5 % — ABNORMAL LOW (ref 28–48)
MCH: 31.5 pg (ref 25.4–34.6)
MCHC: 33.1 gm/dl (ref 30.0–36.0)
MCV: 95.4 fL (ref 80.0–98.0)
MONOCYTES: 7.6 % (ref 1–13)
MPV: 10.9 fL — ABNORMAL HIGH (ref 6.0–10.0)
NEUTROPHILS: 75.4 % — ABNORMAL HIGH (ref 34–64)
NRBC: 0 (ref 0–0)
PLATELET: 213 10*3/uL (ref 140–450)
RBC: 3.9 M/uL (ref 3.60–5.20)
RDW-SD: 47.1 — ABNORMAL HIGH (ref 36.4–46.3)
WBC: 7.8 10*3/uL (ref 4.0–11.0)

## 2016-02-20 LAB — METABOLIC PANEL, BASIC
BUN: 3 mg/dl — ABNORMAL LOW (ref 7–25)
CO2: 18 mEq/L — ABNORMAL LOW (ref 21–32)
Calcium: 8 mg/dl — ABNORMAL LOW (ref 8.5–10.1)
Chloride: 116 mEq/L — ABNORMAL HIGH (ref 98–107)
Creatinine: 0.9 mg/dl (ref 0.6–1.3)
GFR est AA: >60
GFR est non-AA: >60
Glucose: 101 mg/dl (ref 74–106)
Potassium: 3.6 mEq/L (ref 3.5–5.1)
Sodium: 140 mEq/L (ref 136–145)

## 2016-02-20 MED ORDER — DICYCLOMINE 10 MG CAP
10 mg | Freq: Three times a day (TID) | ORAL | Status: DC
Start: 2016-02-20 — End: 2016-02-21
  Administered 2016-02-20 – 2016-02-21 (×5): via ORAL

## 2016-02-20 MED FILL — DICYCLOMINE 10 MG CAP: 10 mg | ORAL | Qty: 2

## 2016-02-20 MED FILL — GABAPENTIN 300 MG CAP: 300 mg | ORAL | Qty: 2

## 2016-02-20 MED FILL — CIPROFLOXACIN IN D5W 400 MG/200 ML IV PIGGY BACK: 400 mg/200 mL | INTRAVENOUS | Qty: 200

## 2016-02-20 MED FILL — METRONIDAZOLE IN SODIUM CHLORIDE (ISO-OSM) 500 MG/100 ML IV PIGGY BACK: 500 mg/100 mL | INTRAVENOUS | Qty: 100

## 2016-02-20 MED FILL — OFIRMEV 1,000 MG/100 ML (10 MG/ML) INTRAVENOUS SOLUTION: 1000 mg/100 mL (10 mg/mL) | INTRAVENOUS | Qty: 100

## 2016-02-20 MED FILL — ONDANSETRON (PF) 4 MG/2 ML INJECTION: 4 mg/2 mL | INTRAMUSCULAR | Qty: 2

## 2016-02-20 MED FILL — TOPIRAMATE 100 MG TAB: 100 mg | ORAL | Qty: 1

## 2016-02-20 MED FILL — BUPROPION XL 300 MG 24 HR TAB: 300 mg | ORAL | Qty: 1

## 2016-02-20 MED FILL — PROMETHAZINE 25 MG TAB: 25 mg | ORAL | Qty: 1

## 2016-02-20 NOTE — Progress Notes (Signed)
Peripheral Vascular Lab Preliminary : Mesenteric Duplex    1. There is no evidence of a significant stenosis in the celiac artery, superior mesenteric artery and hepatic artery.  2. Unable to visualize the splenic and inferior mesenteric arteries. Cannot rule out stenosis in these vessels.     Final report to follow   Belva Agee, RVS

## 2016-02-20 NOTE — Progress Notes (Signed)
Hospitalist Progress Note         [facility]    Daily Progress Note: 02/20/2016    Assessment/Plan:  1. Acute Colitis w H/O Bloody Diarrhea. On ABx. C. Dif -, other Stool studies pending. Improving. Mesenteric PVLs ok  2. Celiac Dz, IBS. Sees Dr. Alona Bene  3. Acute Blood loss Anemia from LGIB, H/H stable  4. Fibromyalgia  5. Depression  6. H/O Migraine HA  7. DVT proph w SCDS. (No AC given Hematechezia)  8. Dispo: Overall, improving. Still nausea & diarrhea. Hopefully home tom       Subjective:   Less abd pain. Mild nausea, no vomiting. Still quite a bit of diarrhea.. No more bloody diarrhea.Overall, feels better than on admission.      General ROS: negative for - fatigue, fever or malaise  Respiratory ROS: no cough, shortness of breath,pleutic chest pain or wheezing  Cardiovascular ROS: no chest pain , dyspnea on exertion, orthopnea  Genito-Urinary ROS: no dysuria, trouble voiding, or hematuria    Objective:   Physical Exam:     Visit Vitals   ??? BP 94/66 (BP 1 Location: Right arm, BP Patient Position: Supine)   ??? Pulse 79   ??? Temp 97.3 ??F (36.3 ??C)   ??? Resp 16   ??? Ht 4' 11.5" (1.511 m)   ??? Wt 77.1 kg (170 lb)   ??? LMP 08/17/2015   ??? SpO2 100%   ??? BMI 33.76 kg/m2      O2 Device: Room air    Temp (24hrs), Avg:97.9 ??F (36.6 ??C), Min:97.3 ??F (36.3 ??C), Max:98.3 ??F (36.8 ??C)    01/16 0701 - 01/16 1900  In: 240 [P.O.:240]  Out: -    01/14 1901 - 01/16 0700  In: 4143.3 [I.V.:4143.3]  Out: 1550 [Urine:1550]    General:  Alert, cooperative, no distress, appears stated age.   Lungs:   Clear to auscultation bilaterally.    :     Heart:  Regular rate and rhythm, S1, S2 normal, no murmur, click, rub or gallop.   Abdomen:   Soft, less diffuse tenderness   Extremities:  FROM, no LE edema   Pulses: 2+ and symmetric all extremities.   Skin: Skin color, texture, turgor normal. No rashes or lesions   :      Data Review:       24 Hour Results:  Recent Results (from the past 24 hour(s))   C. DIFFICILE/EPI PCR     Collection Time: 02/19/16  4:20 PM   Result Value Ref Range    C. diff toxin by PCR Toxigenic C. difficile NEGATIVE Toxigenic C. difficile NEGATIVE         Problem List:  Problem List as of 02/20/2016  Date Reviewed: 06-05-2014          Codes Class Noted - Resolved    Colitis ICD-10-CM: K52.9  ICD-9-CM: 558.9  02/17/2016 - Present        Rectal bleeding ICD-10-CM: K62.5  ICD-9-CM: 569.3  02/17/2016 - Present               Medications reviewed  Current Facility-Administered Medications   Medication Dose Route Frequency   ??? 0.9% sodium chloride infusion  125 mL/hr IntraVENous CONTINUOUS   ??? dicyclomine (BENTYL) capsule 20 mg  20 mg Oral TIDAC   ??? alum-mag hydroxide-simeth (MYLANTA) oral suspension 30 mL  30 mL Oral Q4H PRN   ??? promethazine (PHENERGAN) tablet 25 mg  25 mg Oral Q6H PRN   ???  ondansetron (ZOFRAN) injection 4 mg  4 mg IntraVENous Q6H PRN   ??? promethazine (PHENERGAN) suppository 25 mg  25 mg Rectal Q6H PRN   ??? acetaminophen (TYLENOL) tablet 650 mg  650 mg Oral Q6H PRN   ??? acetaminophen (OFIRMEV) infusion 1,000 mg  1,000 mg IntraVENous Q8H PRN   ??? morphine injection 2 mg  2 mg IntraVENous Q3H PRN   ??? ciprofloxacin (CIPRO) 400 mg IVPB (premix)  400 mg IntraVENous Q12H   ??? metroNIDAZOLE (FLAGYL) IVPB premix 500 mg  500 mg IntraVENous Q8H   ??? zolpidem (AMBIEN) tablet 5 mg  5 mg Oral QHS PRN   ??? gabapentin (NEURONTIN) capsule 600 mg  600 mg Oral BID   ??? topiramate (TOPAMAX) tablet 100 mg  100 mg Oral QHS   ??? buPROPion XL (WELLBUTRIN XL) tablet 300 mg  300 mg Oral 7am   ??? albuterol (PROVENTIL VENTOLIN) nebulizer solution 2.5 mg  2.5 mg Nebulization Q6H PRN        Care Plan discussed with: pt    Total time spent with patient: 25 minutes.    Dorothe Pea, MD  February 20, 2016  1120

## 2016-02-20 NOTE — Progress Notes (Addendum)
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 Other ddx: infectious, ibd, ibs with hemorrhoids, etc.    -Mesenteric Duplex 02/20/2016 preliminary reading showed no evidence of a significant stenosis in the celiac artery, superior mesenteric artery and hepatic artery. Unable to visualize the splenic and inferior mesenteric arteries. Cannot rule out stenosis in these vessels.   -C.Diff negative  -CT abd/pelvis 02/17/16: 1. Colitis either inflammatory or infectious (tranverse and descending colon).   -Last colonoscopy was 07/02/15 by Dr Delma Freeze and was normal.  ---Leukocytosis, 2/2 to above, trending down  -Blood Cultures with no growth thus far  ---Normocytic Anemia-Hgb 10.8gm/dl. No active bleeding   ---Celiac Disease, HLA DQ 8 positive with elevated tTgA antibody. Diagnosis made in 2007 based on histologic and serologic abnormalities noted in 2007.  ---IBS-Diarrhea predominant - controlled with Viberzi 15m BID and dicyclomine 169m1/2 hour ACTID/prn as outpatient (last dose of Viberzi was 02/16/16).  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 On Caltrate as outpatient.  Plan:   ---Continue/complete antibiotic course (Cipro and Flagyl)  ---Continue Bentyl 2024mO 1 hour AC TID  ---Continue strict gluten free diet   ---Continue conservative management   ---No plans for repeat colonoscopy at this time   ---Once discharged, Pt will need to f/u as an outpt with Dr. TioDelma Freeze 3-[place]     Attending Note:  The patient was independently interviewed and examined and the above assessment and plan were formulated    SUBJECTIVE:   Pt reports improvement in her abdominal crampy pain currently rating 5/10 more prominent across lower abdomen. She denies SOB, chest pain, nausea or vomiting. Tolerating diet. 1 small loose yellowish appearing stool so far today. She denies hematochezia or melena    OBJECTIVE:  Patient Vitals for the past 24 hrs:   Temp Pulse Resp BP SpO2   02/20/16 0820 97.3 ??F (36.3 ??C) 79 16 94/66 100 %   02/20/16 0422 97.8 ??F (36.6 ??C) 73 16 92/63 97 %   02/19/16 2335 98 ??F (36.7 ??C) 76 16 93/56 94 %   02/19/16 2014 98.3 ??F (36.8 ??C) 89 16 107/71 95 %   02/19/16 1553 98.1 ??F (36.7 ??C) 89 18 90/51 98 %   02/19/16 1144 97.9 ??F (36.6 ??C) 88 19 99/57 97 %       Intake/Output Summary (Last 24 hours) at 02/20/16 0931  Last data filed at 02/20/16 0420   Gross per 24 hour   Intake          1985.42 ml   Output             1550 ml   Net           435.42 ml     Physical Exam:  GENERAL: alert, pleasant, cooperative, no distress, appears stated age  LUNG: clear to auscultation bilaterally  HEART: regular rate and rhythm, S1, S2   ABDOMEN: soft, lower abdominal tenderness appreciated on palpation, bowel sounds normal. No palpable masses, hernias or organomegaly appreciated  Labs: Results:   Chemistry Recent Labs      02/19/16   0556  02/18/16   0625  02/17/16   1837  02/17/16   1836   GLU  93  97  97  91   NA  142  143  138  140   K  4.0  4.1  4.0  3.9   CL  115*  114*  108*  109*   CO2  18*  22  20*  23   BUN  4*  7  10  10    CREA  1.1  1.0  1.1  1.2   CA  8.3*  7.9*   --   8.6   AP   --    --    --   83   TP   --    --    --   6.6   ALB   --    --    --   2.9*    Estimated Creatinine Clearance: 60.5 mL/min (based on Cr of 1.1).   CBC w/Diff Recent Labs      02/19/16   0556  02/18/16   0625  02/17/16   1837  02/17/16   1836   WBC  14.4*  15.5*   --   18.9*   RBC  3.41*  3.80   --   4.44   HGB  10.8*  12.1*  15.3  14.3   HCT  33.0*  36.4*  45  41.9   PLT  181  193   --   222   GRANS  71.5*  80.0*   --   80.6*    LYMPH  16.6*  10.2*   --   8.1*   EOS  1.2  0.8   --   0.6      Cardiac Enzymes No results for input(s): CPK, CKND1, MYO in the last 72 hours.    No lab exists for component: CKRMB, TROIP   Coagulation No results for input(s): PTP, INR, APTT in the last 72 hours.    No lab exists for component: INREXT    Hepatitis Panel No results found for: HAMAT, HAAB, HABT, HAAT, HBSAG, HBSB, HBSAT, HBABN, HBCM, HBCAB, HBCAT, XBCABS, HBEAB, HBEAG, XHEPCS, [id number], HBEGLT, HBCMLT, HBCLT, HBEBLT, [id number], [id number], HAVMLT, [id number], HBCMLT, [id number], HCGAT  Recent Labs      02/17/16   1836   ALT  23   SGOT  8*   TBILI  1.0   AP  83   ALB  2.9*   TP  6.6         Amylase Lipase    Liver Enzymes Recent Labs      02/17/16   1836   TP  6.6   ALB  2.9*   AP  83   SGOT  8*   ALT  23      Thyroid Studies No results for input(s): T4, T3U, TSH, TSHEXT in the last 72 hours.    No lab exists for component: T3RU     Pathology pathology     Diagnostic Imaging:  No results found.    Allergies   Allergen Reactions   ??? Augmentin [Amoxicillin-Pot Clavulanate] Diarrhea   ??? Demerol [Meperidine] Hives and Nausea and Vomiting     Current Facility-Administered Medications   Medication Dose Route Frequency   ??? 0.9% sodium chloride infusion  125 mL/hr IntraVENous CONTINUOUS   ??? dicyclomine (BENTYL) capsule 20  mg  20 mg Oral TIDAC   ??? alum-mag hydroxide-simeth (MYLANTA) oral suspension 30 mL  30 mL Oral Q4H PRN   ??? promethazine (PHENERGAN) tablet 25 mg  25 mg Oral Q6H PRN   ??? ondansetron (ZOFRAN) injection 4 mg  4 mg IntraVENous Q6H PRN   ??? promethazine (PHENERGAN) suppository 25 mg  25 mg Rectal Q6H PRN   ??? acetaminophen (TYLENOL) tablet 650 mg  650 mg Oral Q6H PRN   ??? acetaminophen (OFIRMEV) infusion 1,000 mg  1,000 mg IntraVENous Q8H PRN   ??? morphine injection 2 mg  2 mg IntraVENous Q3H PRN   ??? ciprofloxacin (CIPRO) 400 mg IVPB (premix)  400 mg IntraVENous Q12H   ??? metroNIDAZOLE (FLAGYL) IVPB premix 500 mg  500 mg IntraVENous Q8H    ??? zolpidem (AMBIEN) tablet 5 mg  5 mg Oral QHS PRN   ??? gabapentin (NEURONTIN) capsule 600 mg  600 mg Oral BID   ??? topiramate (TOPAMAX) tablet 100 mg  100 mg Oral QHS   ??? buPROPion XL (WELLBUTRIN XL) tablet 300 mg  300 mg Oral 7am   ??? albuterol (PROVENTIL VENTOLIN) nebulizer solution 2.5 mg  2.5 mg Nebulization Q6H PRN     Active Problems:    Colitis (02/17/2016)      Rectal bleeding (02/17/2016)      Chantel Dixon MS, APRN, FNP-C  Office: [phone]  February 20, 2016

## 2016-02-21 MED ORDER — METRONIDAZOLE 500 MG TAB
500 mg | ORAL_TABLET | Freq: Three times a day (TID) | ORAL | 0 refills | Status: AC
Start: 2016-02-21 — End: 2016-02-25

## 2016-02-21 MED ORDER — SIMETHICONE 80 MG CHEWABLE TAB
80 mg | Freq: Once | ORAL | Status: AC
Start: 2016-02-21 — End: 2016-02-21
  Administered 2016-02-21: 06:00:00 via ORAL

## 2016-02-21 MED ORDER — CIPROFLOXACIN 500 MG TAB
500 mg | ORAL_TABLET | Freq: Two times a day (BID) | ORAL | 0 refills | Status: AC
Start: 2016-02-21 — End: 2016-02-25

## 2016-02-21 MED ORDER — ONDANSETRON HCL 4 MG TAB
4 mg | ORAL_TABLET | Freq: Three times a day (TID) | ORAL | 0 refills | Status: DC | PRN
Start: 2016-02-21 — End: 2017-08-27

## 2016-02-21 MED FILL — METRONIDAZOLE IN SODIUM CHLORIDE (ISO-OSM) 500 MG/100 ML IV PIGGY BACK: 500 mg/100 mL | INTRAVENOUS | Qty: 100

## 2016-02-21 MED FILL — GABAPENTIN 300 MG CAP: 300 mg | ORAL | Qty: 2

## 2016-02-21 MED FILL — CIPROFLOXACIN IN D5W 400 MG/200 ML IV PIGGY BACK: 400 mg/200 mL | INTRAVENOUS | Qty: 200

## 2016-02-21 MED FILL — BUPROPION XL 300 MG 24 HR TAB: 300 mg | ORAL | Qty: 1

## 2016-02-21 MED FILL — TOPIRAMATE 100 MG TAB: 100 mg | ORAL | Qty: 1

## 2016-02-21 MED FILL — GAS-X 80 MG CHEWABLE TABLET: 80 mg | ORAL | Qty: 1

## 2016-02-21 MED FILL — PROMETHAZINE 25 MG TAB: 25 mg | ORAL | Qty: 1

## 2016-02-21 MED FILL — DICYCLOMINE 10 MG CAP: 10 mg | ORAL | Qty: 2

## 2016-02-21 NOTE — Discharge Summary (Signed)
[facility]  Discharge Summary   NAME:  [name] [name]  SEX:   F  ADMIT: 02/17/2016  DISCH:   DOB:   [birth year]  MR#    [id number]  ACCT#  [id number]    cc: Cipriano Bunker MD, Thayer Jew MD    DATE OF ADMISSION:  02/17/2016.    DATE OF DISCHARGE:  02/21/2016.    Please refer to admission history and physical by Dr. Wenda Low for pertinent past medical problems as well as details leading to this hospital stay.    Briefly, the patient is a 46 year old female with past medical history significant for irritable bowel syndrome, fibromyalgia, anxiety/depression.  She also has been diagnosed with celiac disease and follows a gluten-free diet.  She has been seen by Dr. Leonard Downing from gastroenterology, her last colonoscopy was 07/02/15, and this was reportedly normal.  She presented to the Emergency Department complaining of several episodes of diarrhea, some of which had bright bloody stool to the point of turning the toilet water red.  She had diffuse abdominal pain.  Intermittent nausea and vomiting.  No obvious fever.  No one at home was ill.  No recent antibiotics.  No recent travel.  Does note she ate sushi the day prior to presentation.  No hematemesis.  Upon presentation to the emergency room, she had a temperature of 98, blood pressure 109/73, pulse 101, O2 sat was 98%.  On initial examination noted to have significant epigastric tenderness.  No rebound tenderness or palpable masses.  Per ED physician, on rectal exam, there was heme-positive stool.  Her initial laboratory studies showed WBC 18.9, hemoglobin 14.3, hematocrit 41.9, platelets 222.  A urinalysis was unremarkable.  INR was 1.  Sodium 140, potassium 3.9, chloride 109, CO2 23, glucose 91, BUN 10, creatinine 1.2.  Liver enzymes normal.  Urine pregnancy test negative.  CT of the abdomen and pelvis did show thickening of the transverse and descending colon consistent with \\"colitis, inflammatory or infectious.\\"  Also, noted 3 mm nonobstructing  left kidney stone and an IUD in place.  She was admitted for further evaluation and treatment of colitis with associated hematochezia.    She was given IV fluids.  She was started on IV Cipro and Flagyl.  Initially, clear liquids and diet was gradually advanced.  She was seen in consultation by Dr. Precious Gilding from gastroenterology, he felt that this was most likely ischemic colitis.  He did recommend mesenteric PVLs, which was normal.    The patient's nausea and vomiting have resolved.  Abdominal pain has resolved.  She has not had a bloody stool since the evening of admission.  She is still having some diarrheal stool.  She feels much improved.  She is eating and drinking without any difficulty.  On examination, her abdomen is soft with minimal tenderness.  It is felt she is stable for discharge at this time and will complete 4 more days of Cipro and Flagyl.    MOST RECENT LABORATORY STUDIES:    On 02/20/2016, WBC 7.8, hemoglobin 12.3, hematocrit 37.2 (stable), platelets 213.  Sodium 140, potassium 3.6, chloride 116, CO2 18, glucose 101, BUN 3, creatinine 0.9.    DISCHARGE DIAGNOSES:  1.  Acute colitis, possible infectious versus ischemic, doubt inflammatory bowel disease (IBD).  She will complete course of Cipro and Flagyl.  2.  Acute blood loss anemia, hemoglobin and hematocrit are stable.  3.  Celiac disease.  4.  Irritable bowel syndrome.  5.  Fibromyalgia.  6.  Depression.  7.  History of migraine headaches.    DISCHARGE MEDICATIONS:  Flagyl 500 mg t.i.d. times 4 days, Cipro 500 mg b.i.d. times 4 days, Zofran 4 mg every 8 hours p.r.n. nausea and she will continue albuterol inhaler p.r.n., Bentyl 20 mg t.i.d. p.r.n., Neurontin 400 mg b.i.d., Provigil daily, Topamax 100 mg at bedtime, trazodone 100 mg at bedtime, Wellbutrin-XL 300 mg daily.    She will continue her gluten-free diet.    She should follow up with Dr. Gibson Ramp in a week and with Dr. Leonard Downing in 2 to 4 weeks.    AS AN ADDENDUM:  Stool for C. difficile  was negative, stool culture grew normal flora.  Blood cultures on admission have remained negative.    Total time to coordinate discharge was 40 minutes.      ___________________  Charisse Klinefelter MD  Dictated By: .   SB  D:02/21/2016 32:91:91  T: 02/21/2016 11:21:48  [id number]

## 2016-02-21 NOTE — Progress Notes (Addendum)
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     ---Normocytic Anemia-Hgb stable. No active bleeding   ---Celiac Disease, HLA DQ 8 positive with elevated tTgA antibody. Diagnosis made in 2007 based on histologic and serologic abnormalities noted in 2007.  ---IBS-Diarrhea predominant-controlled with Viberzi 160m BID and dicyclomine 165m1/2 hour ACTID/prn as outpatient (last dose of Viberzi was 02/16/16).  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 On Caltrate as outpatient.  Plan:   ---stop antibiotics course  ---Continue Bentyl 2038mO 1 hour AC QID  ---Continue Strict Gluten Free Diet   ---Continue conservative management   ---No plans for repeat colonoscopy at this time   ---Once discharged, Pt will need to f/u as an outpt with Dr. TioDelma Freeze 3 weeks.   OK  to resume Viberzi on Jan. 24 after she more fully recovers from Ischemic Colitis.       Attending Note:  I have independently reviewed the patient's chart and discussed with NP Dixon to formulate the abfe assessment and plan    SUBJECTIVE:  Pt reports resolution of her abdominal pain. She denies SOB, chest pain, nausea or vomiting. Tolerating diet. 2 loose yellowish appearing stools so far today. She denies hematochezia or melena    OBJECTIVE:  Patient Vitals for the past 24 hrs:   Temp Pulse Resp BP SpO2   02/21/16 0816 98.2 ??F (36.8 ??C) 69 16 98/60 100 %   02/21/16 0348 98.3 ??F (36.8 ??C) 91 18 101/62 96 %   02/21/16 0003 98.5 ??F (36.9 ??C) 90 16 93/52 95 %   02/20/16 2001 97.9 ??F (36.6 ??C) 87 16 112/75 92 %   02/20/16 1539 98.2 ??F (36.8 ??C) 81 16 107/63 99 %    02/20/16 1149 98 ??F (36.7 ??C) 75 16 124/60 100 %       Intake/Output Summary (Last 24 hours) at 02/21/16 0915  Last data filed at 02/21/16 0654   Gross per 24 hour   Intake          5252.91 ml   Output              700 ml   Net          4552.91 ml     Physical Exam:  GENERAL: alert, pleasant, cooperative, no distress, appears stated age  LUNG: clear to auscultation bilaterally  HEART: regular rate and rhythm, S1, S2   ABDOMEN: soft, no tenderness, active bowel sounds. No palpable masses, hernias or organomegaly appreciated     Labs: Results:   Chemistry Recent Labs      02/20/16   1232  02/19/16   0556   GLU  101  93   NA  140  142   K  3.6  4.0   CL  116*  115*   CO2  18*  18*   BUN  3*  4*   CREA  0.9  1.1   CA  8.0*  8.3*    Estimated Creatinine Clearance: 73.9 mL/min (based on Cr of 0.9).  CBC w/Diff Recent Labs      02/20/16   1232  02/19/16   0556   WBC  7.8  14.4*   RBC  3.90  3.41*   HGB  12.3*  10.8*   HCT  37.2  33.0*   PLT  213  181   GRANS  75.4*  71.5*   LYMPH  13.5*  16.6*   EOS  2.8  1.2      Cardiac Enzymes No results for input(s): CPK, CKND1, MYO in the last 72 hours.    No lab exists for component: CKRMB, TROIP   Coagulation No results for input(s): PTP, INR, APTT in the last 72 hours.    No lab exists for component: INREXT, INREXT    Hepatitis Panel No results found for: HAMAT, HAAB, HABT, HAAT, HBSAG, HBSB, HBSAT, HBABN, HBCM, HBCAB, HBCAT, XBCABS, HBEAB, HBEAG, XHEPCS, [id number], HBEGLT, HBCMLT, HBCLT, HBEBLT, [id number], [id number], HAVMLT, [id number], HBCMLT, [id number], HCGAT  No results for input(s): ALT, SGOT, TBILI, AP, ALB, TP in the last 72 hours.    No lab exists for component: CBILI      Amylase Lipase    Liver Enzymes No results for input(s): TP, ALB, TBIL, AP, SGOT, ALT in the last 72 hours.    No lab exists for component: DBIL   Thyroid Studies No results for input(s): T4, T3U, TSH, TSHEXT, TSHEXT in the last 72 hours.    No lab exists for component: T3RU     Pathology pathology      Diagnostic Imaging:  Duplex Abd Visc Art/ven/organs Complete    Result Date: 02/20/2016                                                                Study ID: [facility]                                                   [address] [place], [place]                           Mesenteric Duplex Report Name: JOBY, HERSHKOWITZ Dat            e: 02/20/2016 08:54 AM MRN: [id number]                     Patient Location: [id number] DOB: 03/19/1970                 Age: 46 yrs Gender:  Female                  Account #: [id number] Reason For Study: Gastrointestinal hemorrhage, unspecified Ordering Physician: Prentiss Bells Performed By: Dena Billet Interpretation Summary There is no evidence of a significant stenosis in the celiac artery, superior mesenteric artery and hepatic artery. Unable to visualize the splenic and inferior mesenteric arteries. Cannot rule out stenosis in these vessels. _____________________________________________________________________________ _ Procedure A duplex exam of the mesenteric arteries was performed to determine the presence or absence of mesenteric artery occlusive disease. ICD 10- K92.2. Celiac Artery The velocity of the Celiac Artery origin is 101cm/s. The velocity of the Celiac Artery is 171 cm/s. Doppler flow evaluation of the celiac artery shows no evidence of a hemodynamically significant stenosis. Hepatic Artery The velocity of the Hepatic Artery is 127 cm/s. Doppler flow evaluation of the hepatic artery shows no evidence of a hemodynamically significant stenosis. Splenic Artery The Splenic Artery is not visualized. Cannot rule out stenosis in this artery. Proximal Superior Mesenteric Artery The velocity of the superior mesenteric artery origin is 91.2 cm/s. The velocity of the proximal Superior Mesenteric Artery is 161 cm/sec. There is no evidence of stenosis involving the origin and proximal mesenteric artery. Mid  Superior Mesenteric Artery The velocity of the mid Superior Mesenteric Artery is 107 cm/sec. There is no evidence of stenosis involving the mid mesenteric artery. Distal Superior Mesenteric Artery The velocity of the distal Superior Mesenteric Artery is 83.0 cm/sec. There is no evidence of stenosis involving the distal mesenteric artery. Proximal Inferior Mesenteric Artery The proximal Inferior mesenteric artery is not visualized. Cannot rule out stenosis in this vessel. Proximal Abdominal Aorta The proximal abdominal aortic velocity is 75.7 cm/s. Sonographer comments Technically difficult limited exam due to heavy overlying bowel gas and acoustic shadowing. Electronically signed byDr. Demetrio Lapping, MD   02/20/2016 10:12 PM       Allergies   Allergen Reactions   ??? Augmentin [Amoxicillin-Pot Clavulanate] Diarrhea   ??? Demerol [Meperidine] Hives and Nausea and Vomiting     Current Facility-Administered Medications   Medication Dose Route Frequency   ??? 0.9% sodium chloride infusion  125 mL/hr IntraVENous CONTINUOUS   ??? dicyclomine (BENTYL) capsule 20 mg  20 mg Oral TIDAC   ??? alum-mag hydroxide-simeth (MYLANTA) oral suspension 30 mL  30 mL Oral Q4H PRN   ??? promethazine (PHENERGAN) tablet 25 mg  25 mg Oral Q6H PRN   ??? ondansetron (ZOFRAN) injection 4 mg  4 mg IntraVENous Q6H PRN   ??? promethazine (PHENERGAN) suppository 25 mg  25 mg Rectal Q6H PRN   ??? acetaminophen (TYLENOL) tablet 650 mg  650 mg Oral Q6H PRN   ??? acetaminophen (OFIRMEV) infusion 1,000 mg  1,000 mg IntraVENous Q8H PRN   ??? morphine injection 2 mg  2 mg IntraVENous Q3H PRN   ??? ciprofloxacin (CIPRO) 400 mg IVPB (premix)  400 mg IntraVENous Q12H   ??? metroNIDAZOLE (FLAGYL) IVPB premix 500 mg  500 mg IntraVENous Q8H   ??? zolpidem (AMBIEN) tablet 5 mg  5 mg Oral QHS PRN   ??? gabapentin (NEURONTIN) capsule 600 mg  600 mg Oral BID   ??? topiramate (TOPAMAX) tablet 100 mg  100 mg Oral QHS   ??? buPROPion XL (WELLBUTRIN XL) tablet 300 mg  300 mg Oral 7am    ??? albuterol (PROVENTIL VENTOLIN) nebulizer solution 2.5 mg  2.5 mg Nebulization Q6H PRN     Active Problems:    Colitis (02/17/2016)  Rectal bleeding (02/17/2016)      Chantel Dixon MS, APRN, FNP-C  Office: [phone]  February 21, 2016

## 2016-02-21 NOTE — Other (Addendum)
----------  DocumentID: [id number]------------------------------------------------              [facility]                       Patient Education Report         Name: [name] [name], [name] [name]                  Date: 02/17/2016    MRN: [id number]                    Time: 11:05:54 PM         Patient ordered video: 'Patient Safety: Stay Safe While you are in the Hospital'    from [id number] via phone number: [phone] at 11:05:54 PM    Description: This program outlines some of the precautions patients can take to ensure a speedy recovery without extra complications. The video emphasizes the importance of communicating with the healthcare team.    ----------DocumentID: [id number]------------------------------------------------                       [facility]          Patient Education Report - Discharge Summary        Date: 02/21/2016   Time: 12:50:11 PM   Name: [name] [name], [name] [name]   MRN: [id number]      Account Number: [id number]      Education History:        Patient ordered video: 'Patient Safety: Stay Safe While you are in the Hospital' from [id number] on 02/17/2016 11:05:54 PM

## 2016-02-21 NOTE — Progress Notes (Signed)
Problem: Falls - Risk of  Goal: *Absence of Falls  Document Schmid Fall Risk and appropriate interventions in the flowsheet.   Outcome: Progressing Towards Goal  Fall Risk Interventions:  Mobility Interventions: Patient to call before getting OOB         Medication Interventions: Patient to call before getting OOB, Teach patient to arise slowly

## 2016-02-21 NOTE — Discharge Summary (Signed)
[facility]  Discharge Summary   NAME:  [name] [name]  SEX:   F  ADMIT: 02/17/2016  DISCH:   DOB:   05/05/1970  MR#    [id number]  ACCT#  [id number]    cc: Cipriano Bunker MD, Thayer Jew MD    DATE OF ADMISSION:  02/17/2016.    DATE OF DISCHARGE:  02/21/2016.    Please refer to admission history and physical by Dr. Wenda Low for pertinent past medical problems as well as details leading to this hospital stay.     Briefly, the patient is a 46 year old female with past medical history significant for irritable bowel syndrome, fibromyalgia, anxiety/depression.  She also has been diagnosed with celiac disease and follows a gluten-free diet.  She has been seen by Dr. Leonard Downing from gastroenterology, her last colonoscopy was 07/02/15, and this was reportedly normal.  She presented to the Emergency Department complaining of several episodes of diarrhea, some of which had bright bloody stool to the point of turning the toilet water red.  She had diffuse abdominal pain.  Intermittent nausea and vomiting.  No obvious fever.  No one at home was ill.  No recent antibiotics.  No recent travel.  Does note she ate sushi the day prior to presentation.  No hematemesis.  Upon presentation to the emergency room, she had a temperature of 98, blood pressure 109/73, pulse 101, O2 sat was 98%.  On initial examination noted to have significant epigastric tenderness.  No rebound tenderness or palpable masses.  Per ED physician, on rectal exam, there was heme-positive stool.  Her initial laboratory studies showed WBC 18.9, hemoglobin 14.3, hematocrit 41.9, platelets 222.  A urinalysis was unremarkable.  INR was 1.  Sodium 140, potassium 3.9, chloride 109, CO2 23, glucose 91, BUN 10, creatinine 1.2.  Liver enzymes normal.  Urine pregnancy test negative.  CT of the abdomen and pelvis did show thickening of the transverse and descending colon consistent with \\"colitis, inflammatory or infectious.\\"  Also, noted 3 mm nonobstructing left kidney stone and an IUD in place.  She was admitted for further evaluation and treatment of colitis with associated hematochezia.     She was given IV fluids.  She was started on IV Cipro and Flagyl.  Initially, clear liquids and diet was gradually advanced.  She was seen in consultation by Dr. Precious Gilding from gastroenterology, he felt that this was most likely ischemic colitis.  He did recommend mesenteric PVLs, which was normal.    The patient's nausea and vomiting have resolved.  Abdominal pain has resolved.  She has not had a bloody stool since the evening of admission.  She is still having some diarrheal stool.  She feels much improved.  She is eating and drinking without any difficulty.  On examination, her abdomen is soft with minimal tenderness.  It is felt she is stable for discharge at this time and will complete 4 more days of Cipro and Flagyl.    MOST RECENT LABORATORY STUDIES:    On 02/20/2016, WBC 7.8, hemoglobin 12.3, hematocrit 37.2 (stable), platelets 213.  Sodium 140, potassium 3.6, chloride 116, CO2 18, glucose 101, BUN 3, creatinine 0.9.    DISCHARGE DIAGNOSES:  1.  Acute colitis, possible infectious versus ischemic, doubt inflammatory bowel disease (IBD).  She will complete course of Cipro and Flagyl.  2.  Acute blood loss anemia, hemoglobin and hematocrit are stable.  3.  Celiac disease.  4.  Irritable bowel syndrome.  5.  Fibromyalgia.  6.  Depression.  7.  History of migraine headaches.    DISCHARGE MEDICATIONS:  Flagyl 500 mg t.i.d. times 4 days, Cipro 500 mg b.i.d. times 4 days, Zofran 4 mg every 8 hours p.r.n. nausea and she will continue albuterol inhaler p.r.n., Bentyl 20 mg t.i.d. p.r.n., Neurontin 400 mg b.i.d., Provigil daily, Topamax 100 mg at bedtime, trazodone 100 mg at bedtime, Wellbutrin-XL 300 mg daily.    She will continue her gluten-free diet.    She should follow up with Dr. Gibson Ramp in a week and with Dr. Leonard Downing in 2 to 4 weeks.    AS AN ADDENDUM:  Stool for C. difficile was negative, stool culture grew normal flora.  Blood cultures on admission have remained negative.     Total time to coordinate discharge was 40 minutes.      ___________________  Charisse Klinefelter MD  Dictated By: .   SB  D:02/21/2016 83:25:49  T: 02/21/2016 11:21:48  [id number]

## 2016-02-22 LAB — CULTURE, STOOL

## 2016-02-23 LAB — CULTURE, BLOOD
Blood Culture Result: NO GROWTH
Blood Culture Result: NO GROWTH

## 2016-02-27 LAB — OVA & PARASITES, STOOL

## 2017-02-12 ENCOUNTER — Inpatient Hospital Stay: Admit: 2017-02-12 | Payer: MEDICARE | Primary: Geriatric Medicine

## 2017-02-12 DIAGNOSIS — R42 Dizziness and giddiness: Secondary | ICD-10-CM

## 2017-02-12 NOTE — Progress Notes (Signed)
Progress Notes by Darnelle Going, PT at 02/12/17 1247                Author: Darnelle Going, PT  Service: --  Author Type: Physical Therapist       Filed: 07/07/17 1347  Date of Service: 02/12/17 1247  Status: Addendum          Editor: Darnelle Going, PT (Physical Therapist)          Related Notes: Original Note by Darnelle Going, PT (Physical Therapist) filed at 03/11/17  1430                       [address], [place], [place] [address]   [phone]   _________________________________________________________   Plan of Treatment for Out-Patient Physical Therapy - HCFA 700    [facility] [phone]        Patient: [name] [name]        Age: 47 y.o.        DOB: 09-Jul-1970         MRN: [id number]   Date: 02/12/2017         Referring Physician: Ernst Spell, MD         Provider of Established POC: Leane Para , DPT       Provider electronic signature       Diagnosis: Dizziness and giddiness [R42]    Treatment Diagnosis: vestibulopathy      Onset: Chronic, 5 years ago     Plan of Care: Therapeutic exercises, Gait and balance Training   Neuromuscular Re-education, Vestibular Rehabilitation, Therapeutic Activities, Modalities, Manual Therapy, Physical Performance Testing,   Frequency/Duration: 2-3 times per week for 16 visits.   Certification: From 02/12/17  Through 05/13/17 (90 days)          I certify the need for these services furnished under this plan of treatment and while under my care.            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 Problems with  imbalance began years ago and gradually worsened with no clear onset. Has been tested for MS and negative,  had vestibular testing a few years ago at ENT but negative per pt. Pt c/o intolerance to walking in grocery store, quick head moevements, busy environments, ataxia during these prior mentioned or head aches with feelings of unsteadiness even when sitting.  Will use SPC at times due to this. No c/o of spinning or BPPV at any time or history of this, no c/o intolerance to moving in bed or turning over. Restricts head movements in ends of ROM due to unsteadiness feeling. Reports she falls occasionally mostly  able to self correct with stumble or grabbing things, but does land on floor sometimes   Onset:  Years ago   Length of initial episode: chronic, no BPPV   Hearing deficit/tinnitus : decreased hearing B symmetrical per pt, tinnitus present  Medications: Reviewed in chart.   Pain Scale Rating (0-10 scale):  0   Any medication changes, allergies to medications, adverse drug reactions,  diagnosis change, or new procedure performed?: []   No    []  Yes (see summary sheet  for update)   Allergies: mult  environmental   Precautions:  falls   Past Medical History: see below   Present illness history:       Problem List   Date Reviewed:  Jun 09, 2014                        Codes  Class  Noted             Colitis  ICD-10-CM: K52.9   ICD-9-CM: 558.9    02/17/2016                       Rectal bleeding  ICD-10-CM: K62.5   ICD-9-CM: 569.3    02/17/2016                        Past Medical history:      Past Medical History:        Diagnosis  Date         ?  Chronic kidney disease            stage 3         ?  Fibromyalgia           ?  Palate mass          Occupation/Social History: not employed   Previous Physical Therapy:  none   Patient's Goal:  Get better, fall less, feel comfortable in busy environments   Dizziness Handicap Inventory Questionnaire: 68/100        OBJECTIVE:        Posture:   Forwards slumped posture with  rounded shoulders and mild forward  head      ROM:     Cervical: WFL throughout, pt reports feeling stiff    UE: WNL    LE: WNL      Strength:     UE: WNL    LE: WNL except R LE 4-/5, pt reports R side will be weaker sometimes including R UE      Sensation:  Occasional tingle in finger tips and toes, inconsistent       Proprioception: intact      Gross motor coordination: intact, pt reports clumsy when upright      Gait Assessment: forward rounded posture, short step, tends to stay close to walls, doe not look about environment, tends to gaze forward or  down.    Head Turns: ataxia and head discomfort      Vertebral Artery Testing NT, declined testing position  today      Oculomotor Testing:         Ocular ROM:   [x]  WFL    []   Limited    Describe:        Spontaneous Nystag. [x]   Neg     []  Pos    []  Left    []   Right        Gaze Holding Nystag. [x]   Neg     []  Pos    []  Left    []   Right         Smooth Pursuit  [x]   Neg     []  Pos    []  Left    []   Right         Saccades   [x]   Neg     []  Pos    []  Left    []   Right         VOR - Slow Head Mvmt [x]   Neg     []  Pos    []  Left    []   Right         VOR - Fast Head Mvmt []   Neg     [x]  Pos    [x]  Left    [x]   Right  Nausea and head felt bad B during horizontal and vertical, when observing PT demo felt queasy as well        Head Thrust  []   Neg     [x]  Pos    [x]  Left    []   Right  No nystagmus seen but gaze followed head and felt nauseous                    Positional testing       Right side-lying: looking up/down NT       Left side lying: looking up/down NT       Rolling to the Right: NT       Rolling to the Left: NT   -deferred based on history and pt preference not to attempt today since she drove herself   Vestibular Testing       Dix Hallpike Test: NT today see above         Balance Assessment::                       Functional Gait Assess. []   Neg     [x]  Pos    Score : 20/30        GANS SOPT                     []  Neg     []   Pos    Score:turn to R on 7, sway  with all but 1 and 5   Other Balance Testing - firm & foam surfaces (Eyes Open/Eyes Closed - EO/EC)        Wide BOS   [x]   WFL    []  Pos    Describe:          Romberg   []   WFL    []  Pos    Describe:  EC moderate sway          Stand on Foam  []   WFL    []  Pos    Describe:  EO normal, EC min sway        Sharpened Romberg []  WFL     []  Pos    Describe:  EO normal, EC mod sway        Single Leg Stand  [x]   WFL    []  Pos    Describe:  >10 B LE without support firm ground                   Vestibular Maneuvers: NA            Patient Education: Pt instructed in proper safety awareness at home and in community,  HEP reviewed with pt verbally and with demonstration.        Implemented and performed HEP: gaze stabilization with head movements vertical and horizontal, demo with appt card and pt abl to return demo  and return demo instructions, 2-3x/1 min a day to be performed        TODAY'S TREATMENT:        Evaluation followed by: Teaching of a HEP   x10 minute duration of teaching HEP, pt performance and demonstration of learning after evaluation.    To provide: Improve Mobility        ASSESSMENT:        Pt presenting with chronic history of having gait imbalance, decreased mobility, frequent falls, and overall hypo vestibular function. Pt reports having decreased confidence in gait and overall interference in daily life due to above listed. Pt demonstrates  decreased dynamic and static balance, decreased gait, decreased tolerance to head movement/vestibulopathy B, and increased fall risk. Pt would benefit from skilled PT and vestibular rehab to address above deficits to improve balance, decreased fall risk,  reintegrate vestibular system with dynamic movement, and improve normal gait pattern.        GOALS:      To improve balance, gait, reintegrate vestibular system with dynamic movement and  improve pt subjective feeling towards mobility pt will complete the following goals   Short Term Goals:    1.Pt will improve DHI by  15 points    2. Pt will be able to stand in sharpened rhomberg x30' with EC with min sway    3. Pt will ambulate with normal gait pattern looking up/forward without ataxia x500' and independence      Long Term Goals:    1. Pt will increase FGA to 27/30    2.Pt will improve DHI by 30 points    3. Pt will be able to ambulate x200' while performing head turns in vertical and horizontal directions without report of dizziness or moderate ataxia and Indpendence      Rehab Potential:  good  to meet the above stated goals.      Barriers to achieving goals: co morbidities, lack of compliance with HEP        PLAN:        Skilled Physical Therapy services will modify and progress with vestibular input, address functional mobility deficits, address ROM and strength deficits,  analyze and cue proper movement patterns, promote safety awareness and reduce fall risk, emphasize proprioception and balance strategies, and modify postural abnormalities to reach the stated goals.      Thank you for this referral.            Leane Para, DPT

## 2017-02-12 NOTE — Progress Notes (Addendum)
[address] [place], [place] [address]  [phone]  _________________________________________________________  Plan of Treatment for Out-Patient Physical Therapy ??? HCFA 700   [facility] ??? [id number]      Patient: [name] [name]       Age: 47 y.o.        DOB: [birth year]        MRN: [id number]  Date: 02/12/2017      Referring Physician: Ernst Spell, MD      Provider of Established POC: Leane Para, DPT     Provider electronic signature     Diagnosis: Dizziness and giddiness [R42]   Treatment Diagnosis: vestibulopathy    Onset: Chronic, 5 years ago   Plan of Care: Therapeutic exercises, Gait and balance Training  Neuromuscular Re-education, Vestibular Rehabilitation, Therapeutic Activities, Modalities, Manual Therapy, Physical Performance Testing,  Frequency/Duration: 2-3times per week for 16 visits.  Certification: From 02/12/17 Through 05/13/17 (90 days)       I certify the need for these services furnished under this plan of treatment and while under my care.         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 Problems with imbalance began years ago and gradually worsened with no clear onset. Has been tested for MS and negative, had vestibular testing a few years ago at ENT but negative per pt. Pt c/o intolerance to walking in grocery store, quick head moevements, busy environments, ataxia during these prior mentioned or head aches with feelings of unsteadiness even when sitting. Will use SPC at times due to this. No c/o of spinning or BPPV at any time or history of this, no c/o intolerance to moving in bed or turning over. Restricts head movements in ends of ROM due to unsteadiness feeling. Reports she falls occasionally mostly able to self correct with stumble or grabbing things, but does land on floor sometimes  Onset:  Years ago  Length of initial episode: chronic, no BPPV  Hearing deficit/tinnitus : decreased hearing B symmetrical per pt, tinnitus present    Medications: Reviewed in chart.  Pain Scale Rating (0-10 scale): 0  Any medication changes, allergies to medications, adverse drug reactions, diagnosis change, or new procedure performed?: []  No    []  Yes (see summary sheet for update)  Allergies: mult environmental  Precautions:  falls  Past Medical History: see below  Present illness history:   Problem List  Date Reviewed: Jun 27, 2014          Codes Class Noted    Colitis ICD-10-CM: K52.9  ICD-9-CM: 558.9  02/17/2016        Rectal bleeding ICD-10-CM: K62.5  ICD-9-CM: 569.3  02/17/2016             Past Medical history:   Past Medical History:   Diagnosis Date   ??? Chronic kidney disease     stage 3   ??? Fibromyalgia    ??? Palate mass      Occupation/Social History: not employed  Previous Physical Therapy:  none  Patient's Goal:  Get better, fall  less, feel comfortable in busy environments   Dizziness Handicap Inventory Questionnaire: 68/100    OBJECTIVE:     Posture:   Forwards slumped posture with rounded shoulders and mild forward head    ROM:    Cervical: WFL throughout, pt reports feeling stiff   UE: WNL   LE: WNL    Strength:    UE: WNL   LE: WNL except R LE 4-/5, pt reports R side will be weaker sometimes including R UE    Sensation:  Occasional tingle in finger tips and toes, inconsistent     Proprioception: intact    Gross motor coordination: intact, pt reports clumsy when upright    Gait Assessment: forward rounded posture, short step, tends to stay close to walls, doe not look about environment, tends to gaze forward or down.   Head Turns: ataxia and head discomfort    Vertebral Artery Testing NT, declined testing position today    Oculomotor Testing:        Ocular ROM:   [x]  WFL    []  Limited    Describe:       Spontaneous Nystag. [x]  Neg     []  Pos    []  Left    []  Right       Gaze Holding Nystag. [x]  Neg     []  Pos    []  Left    []  Right        Smooth Pursuit  [x]  Neg     []  Pos    []  Left    []  Right        Saccades   [x]  Neg     []  Pos    []  Left    []  Right        VOR - Slow Head Mvmt [x]  Neg     []  Pos    []  Left    []  Right        VOR - Fast Head Mvmt []  Neg     [x]  Pos    [x]  Left    [x]  Right  Nausea and head felt bad B during horizontal and vertical, when observing PT demo felt queasy as well       Head Thrust  []  Neg     [x]  Pos    [x]  Left    []  Right  No nystagmus seen but gaze followed head and felt nauseous                Positional testing      Right side-lying: looking up/down NT      Left side lying: looking up/down NT      Rolling to the Right: NT      Rolling to the Left: NT  -deferred based on history and pt preference not to attempt today since she drove herself  Vestibular Testing      Dix Hallpike Test: NT today see above      Balance Assessment::                    Functional Gait Assess. []  Neg     [x]  Pos    Score: 20/30        GANS SOPT                     []  Neg     []  Pos    Score:turn to R on 7, sway with all but 1 and 5  Other Balance  Testing - firm & foam surfaces(Eyes Open/Eyes Closed - EO/EC)       Wide BOS   [x]  WFL    []  Pos    Describe:        Romberg   []  WFL    []  Pos    Describe: EC moderate sway         Stand on Foam  []  WFL    []  Pos    Describe: EO normal, EC min sway       Sharpened Romberg []  WFL    []  Pos    Describe: EO normal, EC mod sway       Single Leg Stand  [x]  WFL    []  Pos    Describe: >10 B LE without support firm ground              Vestibular Maneuvers: NA        Patient Education: Pt instructed in proper safety awareness at home and in community,  HEP reviewed with pt verbally and with demonstration.      Implemented and performed HEP: gaze stabilization with head movements vertical and horizontal, demo with appt card and pt abl to return demo and return demo instructions, 2-3x/1 min a day to be performed    TODAY'S TREATMENT:     Evaluation followed by: Teaching of a HEP  x10 minute duration of teaching HEP, pt performance and demonstration of learning after evaluation.   To provide: Improve Mobility    ASSESSMENT:     Pt presenting with chronic history of having gait imbalance, decreased mobility, frequent falls, and overall hypo vestibular function. Pt reports having decreased confidence in gait and overall interference in daily life due to above listed. Pt demonstrates decreased dynamic and static balance, decreased gait, decreased tolerance to head movement/vestibulopathy B, and increased fall risk. Pt would benefit from skilled PT and vestibular rehab to address above deficits to improve balance, decreased fall risk, reintegrate vestibular system with dynamic movement, and improve normal gait pattern.    GOALS:    To improve balance, gait, reintegrate vestibular system with dynamic movement and improve pt subjective feeling towards mobility pt will complete the following goals  Short Term Goals:    1.Pt will improve DHI by 15 points   2. Pt will be able to stand in sharpened rhomberg x30' with EC with min sway   3. Pt will ambulate with normal gait pattern looking up/forward without ataxia x500' and independence    Long Term Goals:   1. Pt will increase FGA to 27/30   2.Pt will improve DHI by 30 points   3. Pt will be able to ambulate x200' while performing head turns in vertical and horizontal directions without report of dizziness or moderate ataxia and Indpendence    Rehab Potential:  good to meet the above stated goals.    Barriers to achieving goals: co morbidities, lack of compliance with HEP    PLAN:     Skilled Physical Therapy services will modify and progress with vestibular input, address functional mobility deficits, address ROM and strength deficits, analyze and cue proper movement patterns, promote safety awareness and reduce fall risk, emphasize proprioception and balance strategies, and modify postural abnormalities to reach the stated goals.    Thank you for this referral.        Leane Para, DPT

## 2017-02-18 ENCOUNTER — Inpatient Hospital Stay: Admit: 2017-02-18 | Payer: MEDICARE | Primary: Geriatric Medicine

## 2017-02-18 NOTE — Progress Notes (Signed)
Physical Therapy Daily Treatment       Patient: [name] [name]  Patient DOB verified :Yes  Visit #: 2 of 16  Total Treatment Time: 40 minutes    Treatment Area: vestibular system    Subjective:     Subjective functional status/changes: Pt reports good compliance with HEP doing 2-3x/day with slow head movements. No new changes since last visit, reports was anxious about appointment today    Pain Level (0-10 scale) current: 0/10  Dizzy Scale 7/10    Objective:     Treatment Program:  -Balance Machine training 12' for maze program and dynamic stability tracking program. Performed both with 1 UE support, VC to attempt without and unable to, VC for weight shifting instead of body moving of COG  -Foam stance: EO/EC 2x45" ea, increased stability with increased time, mild sway EO and mild-mod EC  -Foam stance VOR x1 in horizontal, diagonal, and vertical directions with cognitive involvement, improved stability as time increased, mild-mod sway throughout  -Gait with NBOS, VC for gaze forward instead of down, mild LOB to L, able to step self correct  -Gait with head turns vertical and horizontal planes, slow turns 1 set of turn for 7 step cadence ea, ataxic at first few trials then less ataxia 8x75'  - Standing body sways with TC EO to EC x10 ea coronal and sagittal planes    Rationale for the above treatment:Improve Mobility    Patient Education:    Barriers to Learning/Limitations: none   Education provided to patient on safe mobility, and HEP   Patient / Family readiness to learn indicated by: Demonstrates and Verbalizes      Assessment:      Pt responding well to HEP with better tolerance to head turns today throughout session. Pt reports anxiety regarding a lot life tasks and events and this may be a contributing factor to her dysequilibrium and reduced balance. Pt responded well to treatment today and reported feeling more stable after session. Good response with vestibular and balance training and able to perform balance activities out of comfort zone for patient. A strong association between concentration and balance apparent via cognitive tasking with VOR and balance task as balance more challenged when perform mental tasks. Plan to ramp up difficulty NV as pt will have ride and will not drive self.    Plan:     Continue the above treatment program, making adjustments as needed, to further progress patient to meet the stated goals.

## 2017-02-20 ENCOUNTER — Inpatient Hospital Stay: Admit: 2017-02-20 | Payer: MEDICARE | Primary: Geriatric Medicine

## 2017-02-20 NOTE — Progress Notes (Signed)
Physical Therapy Daily Treatment       Patient: [name] [name]  Patient DOB verified :Yes  Visit #: 3 of 16  Total Treatment Time: 55 minutes    Treatment Area: vestibular system    Subjective:     Subjective functional status/changes: Pt reports good compliance with HEP doing 2-3x/day with slow head movements. No new changes since last visit, rpt husband PTA attended treatment today    Pain Level (0-10 scale) current: 7/10  Dizzy Scale 7/10    Objective:     Treatment Program:  -Photographer training 12' for maze program and dynamic stability tracking program. Performed both with 0-1 UE support, added lv 3 picture game, maintained neurtral BOS throughout very well, VC for weight shifting instead of body moving of COG  -Foam stance: EO/EC  increased stability with increased time, no-mild sway EO and mild EC  -Foam stance VOR x1 in horizontal, diagonal, and vertical directions with cognitive involvement, improved stability as time increased, mild sway throughout  -Gait with NBOS, VC for gaze forward instead of down, veer to L, able to step self correct, improved from last visit  -Gait with head turns vertical and horizontal planes, slow turns 1 set of turn for 6 step cadence ea,  less ataxia overall than last visit  -bending over and reaching to standing and reaching overhead 2x6 without dysequilibrium or dizziness  -Foam obstacle walking with 2 foam bars and 1 square x10 progressing from hand support to touch support looking forward  - Standing body sways with TC EO to EC x10 ea coronal and sagittal planes  -Pt mild nauseous at times that dissipated with rest and reporting mild dysequilibrium after session but no negative side effects overall  Rationale for the above treatment:Improve Mobility    Patient Education:    Barriers to Learning/Limitations: none   Education provided to patient on safe mobility, and HEP progressed to having busy background now    Patient / Family readiness to learn indicated by: Demonstrates and Verbalizes      Assessment:     Pt with large improvements today in both dynamic stability and body awareness. Less sway during VOR task and no LOB during gait tasking today. Pt very encouraged by session and able to notice improvements in stability and balance. Pt tolerating advanced treatment today, reassess for more progression NV    Plan:     Continue the above treatment program, making adjustments as needed, to further progress patient to meet the stated goals.

## 2017-02-25 ENCOUNTER — Inpatient Hospital Stay: Admit: 2017-02-25 | Payer: MEDICARE | Primary: Geriatric Medicine

## 2017-02-25 NOTE — Progress Notes (Signed)
Physical Therapy Daily Treatment       Patient: [name] [name]  Patient DOB verified :Yes  Visit #: 4 of 16  Total Treatment Time: 55 minutes    Treatment Area: vestibular system    Subjective:     Subjective functional status/changes: Pt reports good compliance with HEP only able to tolerate 10-15 second but has been increasing head movement speed.   Pain Level (0-10 scale) current: 0/10  Dizzy Scale 7/10    Objective:     Treatment Program:  -Doctor, hospital 12' for maze program  Performed both with 0 UE support, added lv 4 picture game, maintained neurtral BOS throughout very well, VC for weight shifting instead of body moving of COG, alternated target touching B UE  -Foam stance: EO/EC  increased stability and able to hold conversation, no sway EO and mild EC  -Foam stance VOR x1 in horizontal, diagonal, and vertical directions with cognitive involvement, improved stability as time increased, mild sway throughout, added gaze following head turns with good tolerance  -Gait with NBOS, VC for gaze forward instead of down, veer to L, able to step self correct, improved from last visit, added VC and TC for arm swing  -Gait with head turns vertical and horizontal planes, slow turns 1 set of turn for 4-6 step cadence ea,  less ataxia overall than last visit, less ataxic after adding VC and TC for arm swing  -bending over and reaching to standing and reaching overhead with swiss ball, added turns to R/L with passing ball on ground then catching, x10 without dysequilibrium or dizziness  -Cone obstacle walking with weaving, turning around cone, and stopping and swinging leg around cone prior to step  - Standing body sways with TC EO to EC x10 ea coronal and sagittal planes  -Pt mild H/A at times that dissipated with rest and reporting mild dysequilibrium after session but no negative side effects overall   Chin tucks x10, added to HEP instructed to perform 3-4x/day with husband(PTA) to perform massage to shoulders and neck  Edu to tell family to encourage arm swing with gait  Edu to slow down head turns at home to be abe to tolerate x60' instead of faster head turns.  Rationale for the above treatment:Improve Mobility    Patient Education:    Barriers to Learning/Limitations: none   Education provided to patient on safe mobility, and HEP progressed to having busy background now   Patient / Family readiness to learn indicated by: Demonstrates and Verbalizes      Assessment:     Pt with good tolerance to increases in all aspects of session today. Improved tolerance to head turning activities during dynamic and static exercises and much improved gait with cues for arm swing. HEP altered and pt clearly performing as seen throughout session. Pt confidence improving as well and is tolerating session well. Plan to not increase difficulty too much next session due to pt driving self    Plan:     Continue the above treatment program, making adjustments as needed, to further progress patient to meet the stated goals.

## 2017-03-04 ENCOUNTER — Inpatient Hospital Stay: Admit: 2017-03-04 | Payer: MEDICARE | Primary: Geriatric Medicine

## 2017-03-04 NOTE — Progress Notes (Signed)
Physical Therapy Daily Treatment       Patient: [name] [name]  Patient DOB verified :Yes  Visit #: 5 of 16  Total Treatment Time: 55 minutes    Treatment Area: vestibular system    Subjective:     Subjective functional status/changes: Pt reports good compliance with HEP only able to tolerate almost a minute duration and feeling a lot better overall. Cancalled last session due to weather.  Pain Level (0-10 scale) current: 0/10  Dizzy Scale 3-5/10    Objective:     Treatment Program:  -Doctor, hospital 12' for maze program  Performed both with 0 UE support 75% of time lv 5, lv 5 to 4 picture game, maintained neurtral BOS throughout very well, VC for weight shifting instead of body moving of COG, alternated target touching B UE  -Foam stance: EO/EC  increased stability and able to hold conversation, no sway EO and mild EC  -Foam horizontal head rotations x13 cones total, increased sway and dysequilibrium feeling towards later reps.  -Foam stance VOR x1 in horizontal, diagonal, and vertical directions with cognitive involvement, improved stability as time increased, mild sway throughout, added gaze following head turns with good tolerance  -Gait with NBOS, VC for gaze forward instead of down, veer to L, able to step self correct, improved from last visit, required less VC and TC for arm swing  -Gait with head turns vertical and horizontal planes, slow turns 1 set of turn for 4-5 step cadence ea,  Required less cueing overall VC and TC for arm swing  -Foam obstacle course x6 with UE support 80% of time  -Standing body sways with TC EO to EC x10 ea coronal and sagittal planes  Dizzy scale at end 6-8/10    Edu to tell family to encourage arm swing with gait  Edu to slow down head turns at home to be abe to tolerate x60' instructed to perform with noise distractions  Rationale for the above treatment:Improve Mobility    Patient Education:    Barriers to Learning/Limitations: none    Education provided to patient on safe mobility, and HEP progressed to having busy background now   Patient / Family readiness to learn indicated by: Demonstrates and Verbalizes      Assessment:     Pt with improved subjective feeling overall, has been performing HEP for longer duration of time and reporting less instability and dysequilibrium at home. Pt responded well to ambulating with head turns and implemented arm swing with less ataxia during all variations of gait today. Pt demo more difficulty in louder environment today towards end of session when more people were in treatment area.    Plan:     Continue the above treatment program, making adjustments as needed, to further progress patient to meet the stated goals.

## 2017-03-06 ENCOUNTER — Inpatient Hospital Stay: Admit: 2017-03-06 | Payer: MEDICARE | Primary: Geriatric Medicine

## 2017-03-06 NOTE — Progress Notes (Signed)
Physical Therapy Daily Treatment       Patient: [name] [name]  Patient DOB verified :Yes  Visit #: 6 of 16  Total Treatment Time: 55 minutes    Treatment Area: vestibular system    Subjective:     Subjective functional status/changes: Pt reports good compliance with HEP only able to tolerate almost a minute duration and feeling a lot better overall. Feels like a normal person today. 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 Able to ambulate at grocery store yesterday without significant difficulty     Pain Level (0-10 scale) current: 0/10  Dizzy Scale 0/10    Objective:     Treatment Program:  -Doctor, hospital 12' for maze program  Performed both with 0 UE support 100% of time lv 6, lv 5 to 4 matching game, maintained neurtral BOS throughout very well, VC for weight shifting instead of body moving of COG, alternated target touching B UE  -Foam stance: EO/EC  increased stability and able to hold conversation, no sway EO and mild EC  -Foam horizontal head rotations x7 cones total, less sway and dysequilibrium feeling  -Foam stance VOR x1 in horizontal, diagonal, and vertical directions with cognitive involvement, improved stability as time increased, mild sway during diagonals , added gaze following head turns with good tolerance  -Gait with NBOS, VC for gaze forward instead of down, veer to L, able to step self correct, improved from last visit, required less VC and TC for arm swing  -Gait with fast/slow cadence and stop on command in closed and open environments x500' total  -Gait with head turns vertical and horizontal planes, slow turns 1 set of turn for 4-5 step cadence ea,  Required no cueing overall for arm swing  -Foam obstacle course x6 with UE support 25% of time  -Standing body sways with TC EO to EC x10 ea coronal and sagittal planes  Dizzy scale at end 6/10    Edu to tell family to encourage arm swing with gait   Edu to slow down head turns at home to be abe to tolerate x60' instructed to perform with noise distractions  Rationale for the above treatment:Improve Mobility    Patient Education:    Barriers to Learning/Limitations: none   Education provided to patient on safe mobility, and HEP progressed to having busy background now   Patient / Family readiness to learn indicated by: Demonstrates and Verbalizes      Assessment:     Pt with much improved subjective feeling, feels more normal than has felt in a long time. Pt with improved stability during balance machine task and able to concentrate on task without significant focus on balance. Pt also tolerating ambulation in open environment with distractions and gait challenges. Tolerating habituation exercises better with more confidence during balance tasks and gait.    Plan:     Continue the above treatment program, making adjustments as needed, to further progress patient to meet the stated goals.

## 2017-03-11 ENCOUNTER — Inpatient Hospital Stay: Admit: 2017-03-11 | Payer: MEDICARE | Primary: Geriatric Medicine

## 2017-03-11 DIAGNOSIS — R42 Dizziness and giddiness: Secondary | ICD-10-CM

## 2017-03-11 NOTE — Progress Notes (Signed)
Physical Therapy Daily Treatment       Patient: [name] [name]  Patient DOB verified :Yes  Visit #: 7 of 16  Total Treatment Time: 55 minutes    Treatment Area: vestibular system    Subjective:     Subjective functional status/changes: Pt reports good compliance with HEP. Able to ambulate around [facility] for almost 2 hours with family and was not overwhelmed during time there but was fatigued after.    Pain Level (0-10 scale) current: 0/10  Dizzy Scale 3/10    Objective:     Treatment Program:  -Doctor, hospital 12' for maze program  Performed both with 0 UE support 100% of time lv 5, lv 5 to 4 matching game, maintained neurtral BOS throughout very well, VC for weight shifting instead of body moving of COG, alternated target touching B UE  -Cone taps 1 to 2 x10' total with pt very cautious at first despite good control during SLS, no LOB overreactions noted during this  -Foam stance: EO/EC  increased stability and able to hold conversation, no sway EO and mild EC  -Firm horizontal and vertical torso rotations x10 holding swiss ball  -Foam stance VOR x1 in horizontal, and vertical directions with cognitive involvement, improved stability as time increased, mild sway during diagonals , added gaze following head turns with good tolerance, searching target for items today with good stability  -Gait with NBOS, VC for gaze forward instead of down, able to step self correct, required much less VC and TC for arm swing  -Gait with fast/slow cadence and stop on command in closed and open environments x500' total, carried swiss ball through busy gym without LOB and good tolerance avoiding people and obstacles  -Gait with head turns vertical and horizontal planes, slow turns 1 set of turn for 4-5 step cadence ea,  Required no cueing overall for arm swing, no ataxia or overreacted balance response today   -Foam obstacle course x6 with UE support 25% of time, added cone throughout to vary step length and improve motor control  Dizzy scale at end 5/10    Edu to tell family to encourage arm swing with gait  Edu to slow down head turns at home to be abe to tolerate x60' instructed to perform with noise distractions  Rationale for the above treatment:Improve Mobility    Patient Education:    Barriers to Learning/Limitations: none   Education provided to patient on safe mobility, and HEP progressed to having busy background now   Patient / Family readiness to learn indicated by: Demonstrates and Verbalizes      Assessment:     Pt with improved subjective feeling again. Pt tolerating more difficult environments and challenging self well at home and in daily life. Pt performance improved with gait and dynamic movements during static stance. Pt showing improved BOS control and stability during balance machine exercises and overall more confidence during difficult task and better balance strategies.    Plan:     Continue the above treatment program, making adjustments as needed, to further progress patient to meet the stated goals.

## 2017-03-13 ENCOUNTER — Inpatient Hospital Stay: Admit: 2017-03-13 | Payer: MEDICARE | Primary: Geriatric Medicine

## 2017-03-13 NOTE — Progress Notes (Signed)
[address], [place], [place]  [phone]  _____________________________________________________     PHYSICAL THERAPY  PROGRESS REPORT    Patient Name: [name] [name]   DOB:   05/01/1970  Physician Name: Tressie Stalker, MD  Diagnosis: Dizziness and giddiness [R42]   Treatment Date(s):    02/12/17 to 03/13/17 (8 visits)  Treatment Program: Theraputic Exercise  Gait and Balance Training  Teaching of a HEP    Patient Progress: Pt has progressed very well throughout vestibular rehabilitation. Pt has subjectively shown improvement via [place] with improvement in score of 22 points indicating dizziness and balance impairment is affecting pt less throughout daily life. Pts largest improvement is self confidence with mobility, upon beginning of PT pt very unsure of abilities and restricted activities and overcompensated during upright tasks and now pt more willing to try normal task out of comfort level and readily accepts new challenges to further improve balance and mobility. Minor increase in FGA score but pt much more confident and comfortable completing tasks as well. Pt now able to ambulate with upright posture, improved arm swing, more normalized gait pattern and able to look about environment occasionally unrestricted. Pt static balance improved as evidences by ability to stand on firm and unstable surfaces performing VOR and head/gaze movement tasks without LOB and has improved utilization of balance strategies when ataxia present and when challenged. Habituation and substitution exercises have had a positive impact on pt and pt is very compliant with HEP and desires to get better.  Pt is excited to continue vestibular rehab and would benefit from continuing.      Gait Assessment:??forward rounded posture, short step cadence, able to look forward and about environment freely during walk with more confidence throughout????????????????????   Head Turns:??mild ataxia and pt able to tolerate for extended distance while performing  ??  Oculomotor Testing:   ??  ??????????VOR - Fast Head Mvmt????????????'[x]'$ ??Left ??????'[x]'$ ??Right ??feels dysequilibrium but able to tolerate and no nausea feeling  ??  Balance Assessment::  ??  ??????????????????Functional Gait Assess.???? ??????Score:??21/30 pt much more confident today when participating with tasks  ??  Other Balance Testing - firm & foam surfaces(Eyes Open/Eyes Closed - EO/EC)  ??  ??????????Romberg??????????????????????????????????????????????????????'[]'$ ??WFL ??????'[]'$ ??Pos ??????Describe:??????????????????EC mild sway????????????????????????????????  ??????????Stand on Foam??????????????????????????????????'[]'$ ??WFL ??????'[]'$ ??Pos ??????Describe:??????????????????EO normal, EC min sway  ??????????Sharpened Romberg??????????????'[]'$ ??WFL ??????'[]'$ ??Pos ??????Describe:??????????????????EO normal, EC mild sway able to maintain x30"  ??  ??  Goal Reassessment  ??  Short Term Goals:  ????????????????????????1.Pt will improve DHI by 15 points  MET  ????????????????????????2. Pt will be able to stand in sharpened rhomberg x30' with EC with min sway MET  ????????????????????????3. Pt will ambulate with normal gait pattern looking up/forward without ataxia x500' and independence MET  ??  Long Term Goals:  ????????????????????????1. Pt will increase FGA to 27/30 Progressing towards  ????????????????????????2.Pt will improve DHI by 30 points Progressing towards  ????????????????????????3. Pt will be able to ambulate x200' while performing head turns in vertical and horizontal directions without report of dizziness or moderate ataxia and Independence Progressing towards  ??      Recommendations:   Continue skilled PT for vestibular rehab to work towards remaining goals and further decrease fall risk and improve functional mobility. 1-2x/wk x4 weeks    If you have any question, please do not hesitate to contact me at [phone].             Signature____________________________       Date: _________________________  Bridgett Larsson, PT, DPT                                             TO BE COMPLETED BY PHYSICIAN                 1. ___X____ Continue Physical Therapy as above for ___4__ weeks.   2. _______ Discontinue Physical Therapy    3. _______ Continue Physical Therapy and change the treatment                      as follows: ____________________________________      Physician Signature ______________________________      Date _________________    Please sign, date and return to [facility] - Fax [phone] ??? [phone]

## 2017-03-13 NOTE — Progress Notes (Addendum)
Physical Therapy Daily Treatment [facility]      Patient: [name] [name]  Patient DOB verified :Yes  Visit #: 8 of 16  Total Treatment Time: 55 minutes    Treatment Area: vestibular system    Subjective:     Subjective functional status/changes: Pt reports having a migraine recently and feels a little more off than usual today    Pain Level (0-10 scale) current: 0/10  Dizzy Scale 3/10      Dizziness Handicap Inventory Questionnaire: 68/100 initial now 46/100  ??  OBJECTIVE:   ??  ??  Gait Assessment: forward rounded posture, short step cadence, able to look forward and about environment freely during walk with more confidence throughout            Head Turns: mild ataxia and pt able to tolerate for extended distance while performing  ??  Oculomotor Testing:          VOR - Fast Head Mvmt    []  Neg     []  Pos    [x]  Left    [x]  Right  feels dysequilibrium but able to tolerate and no nausea feeling    Balance Assessment::                  Functional Gait Assess.      Score: 21/30 pt much more confident today when participating with tasks    Other Balance Testing - firm & foam surfaces(Eyes Open/Eyes Closed - EO/EC)          Romberg                           []  WFL    []  Pos    Describe:         EC mild sway                       Stand on Foam                 []  WFL    []  Pos    Describe:         EO normal, EC min sway       Sharpened Romberg       []  WFL    []  Pos    Describe:         EO normal, EC mild sway able to maintain x30"          Goal Reassessment    Short Term Goals:              1.Pt will improve DHI by 15 points  MET              2. Pt will be able to stand in sharpened rhomberg x30' with EC with min sway MET              3. Pt will ambulate with normal gait pattern looking up/forward without ataxia x500' and independence MET  ??  Long Term Goals:              1. Pt will increase FGA to 27/30 Progressing towards              2.Pt will improve DHI by 30 points Progressing towards               3. Pt will be able to ambulate x200' while performing head turns in vertical and horizontal directions without report  of dizziness or moderate ataxia and Independence Progressing towards        Treatment Program:  -Tour manager 12' for maze program  Performed both with 0 UE support 100% of time lv 5, lv 5 to 4 matching game, maintained neurtral BOS throughout very well, VC for weight shifting instead of body moving of COG, alternated target touching B UE  -Firm ground sharpened rhomberg EO and EC alternating lead foot  -Foam stance: EO/EC  increased stability and able to hold conversation, no sway EO and mild EC  -Firm split stance VOR x1 in horizontal, and vertical directions with cognitive involvement, improved stability as time increased, mild sway during diagonals , added gaze following head turns with good tolerance, searching target for items today with good stability  -Gait with head turns vertical and horizontal planes, slow turns 1 set of turn for 4-5 step cadence ea,  Required no cueing overall for arm swing, mild ataxia veering out of normal path but able to self correct         Rationale for the above treatment:Improve Mobility    Patient Education:    Barriers to Learning/Limitations: none   Education provided to patient on safe mobility, and HEP progressed to having busy background now   Patient / Family readiness to learn indicated by: Demonstrates and Verbalizes      Assessment:     Pt with making great progress towards goals at reassessment today. Pt subjectively much better with more comfort with activities and reporting feeling confident completing tasks at home. Minor increase in FGA score but pt much more confident and comfortable completing tasks. Pt limited by feeling off before session today but performed well nevertheless. See progress note for more detailed assessment, plan to continue for x4 more weeks 1-2x/wk.      Plan:      Continue the above treatment program, making adjustments as needed, to further progress patient to meet the stated goals.

## 2017-03-17 ENCOUNTER — Inpatient Hospital Stay: Admit: 2017-03-17 | Payer: MEDICARE | Primary: Geriatric Medicine

## 2017-03-17 NOTE — Progress Notes (Signed)
Physical Therapy Daily Treatment       Patient: [name] [name]  Patient DOB verified :Yes  Visit #: 9 of 16  Total Treatment Time: 55 minutes    Treatment Area: vestibular system    Subjective:     Subjective functional status/changes: Pt reports feeling great today. The parking lot today stressed her out as she waited 15 minutes for a spot in the rain but felt normal before.    Pain Level (0-10 scale) current: 0/10  Dizzy Scale 0/10    Objective:     Treatment Program:  -Doctor, hospital 12' for maze program  Performed both with 0 UE support 100% of time lv 5, lv 5 to 4 target image game 5x5, maintained neurtral BOS throughout very well, no VC for weight shifting instead of body moving of COG, alternated target touching B UE  -Foam rail pick up cones x4 with no LOB  -Gait with head turns vertical and horizontal planes, slow turns 1 set of turn for 2-3 step cadence ea,  Required no cueing overall for arm swing, no  ataxia veering out of normal path   -Cone taps 1 to 3 then alternated heights x10' total with pt less cautious at first despite good control during SLS, no LOB overreactions noted during this  -Foam rail stance: EO/EC  increased stability and able to hold conversation, no sway EO and mild EC  -Foam VOR x1 and gaze tracking foam rail with cognitive cards x5' total, mild sway  -Gait with NBOS, VC for gaze forward instead of down, able to step self correct, required much less VC and TC for arm swing  -Gait with fast/slow cadence and stop on command in closed and open environments x800' total,  and good tolerance avoiding people and obstacles with less disturbance from busy environment  -Foam obstacle course x6 with UE support 25% of time, added bosu today  -Bosu standing on unstable down with cognitive distraction, flat side down x10 squats hand support 30% of time    Dizzy Scale 2-3/10    Edu to tell family to encourage arm swing with gait   Edu to slow down head turns at home to be abe to tolerate x60' instructed to perform with noise distractions  Rationale for the above treatment:Improve Mobility    Patient Education:    Barriers to Learning/Limitations: none   Education provided to patient on safe mobility, and HEP progressed to having busy background now   Patient / Family readiness to learn indicated by: Demonstrates and Verbalizes      Assessment:     Pt with improved subjective feeling, has overall reported no dizziness now for 1-2 week period. Pt demonstrated good balance responses to new surfaces and challenges today with good improvement in SLS tolerance and improved confidence. VOR response improved with tracking targets and improved stability on rail as well.    Plan:     Continue the above treatment program, making adjustments as needed, to further progress patient to meet the stated goals.

## 2017-03-19 ENCOUNTER — Inpatient Hospital Stay: Admit: 2017-03-19 | Payer: MEDICARE | Primary: Geriatric Medicine

## 2017-03-19 NOTE — Progress Notes (Signed)
Physical Therapy Daily Treatment       Patient: [name] [name]  Patient DOB verified :Yes  Visit #: 10 of 16  Total Treatment Time: 55 minutes    Treatment Area: vestibular system    Subjective:     Subjective functional status/changes: Pt reports feeling great today. Had an upset stomach yesterday but is better today.    Pain Level (0-10 scale) current: 0/10  Dizzy Scale 0/10    Objective:     Treatment Program:  -Doctor, hospital 12' for maze program  Performed both with 0 UE support 100% of time lv 4, lv 4 to 3 target image game 5x5, maintained neutral BOS throughout very well, no VC for weight shifting instead of body moving of COG, alternated target touching B UE Matching game lv 4 much better success today and maintained BOS well  -Gait with head turns vertical and horizontal planes, slow turns 1 set of turn for 2-3 step cadence ea,  Required no cueing overall for arm swing, no ataxia veering out of normal path added stopping and reaching all direction including bending over and out of BOS in mult planes   -Cone taps 1 to 2 x10' total with pt less cautious at first despite good control during SLS, no LOB overreactions noted during this, performed on foam rail  -Firm ground split stance and sharpened rhomberg x10' total VOR x1, gaze tracking, head turn following searching cognitive cards  -Foam VOR x1 and gaze tracking foam rail with cognitive cards x5' total, mild sway  -Gait with NBOS, VC for gaze forward instead of down, able to step self correct, required much less VC and TC for arm swing  -Gait with fast/slow cadence and stop on command in closed and open environments x800' total,  and good tolerance avoiding people and obstacles with less disturbance from busy environment, performed head turns in busy environment with good stability  -Foam obstacle course x6 with UE support 20% of time, continued bosu today   -Bosu standing on unstable down with cognitive distraction reaching out of BOS, flat side down x10 squats hand support 10% of time    Dizzy Scale 2-3/10    Edu to tell family to encourage arm swing with gait  Edu to slow down head turns at home to be able to tolerate x60' instructed to perform with noise distractions  Rationale for the above treatment:Improve Mobility    Patient Education:    Barriers to Learning/Limitations: none   Education provided to patient on safe mobility, and HEP progressed to having busy background now   Patient / Family readiness to learn indicated by: Demonstrates and Verbalizes      Assessment:     Pt with improved subjective feeling at home doing well with difficult environments and challenging self daily. Pt tolerating much more difficult tasks in therapy and overall improved confidence in balance has be the key to this. Pt SLS, dynamic and static stability improving with difficult stance positions and advanced head movements and cognitive tasking performed. Pt improves every visit and subjectively reports shes feels even better each session.    Plan:     Continue the above treatment program, making adjustments as needed, to further progress patient to meet the stated goals.

## 2017-03-25 ENCOUNTER — Inpatient Hospital Stay: Admit: 2017-03-25 | Payer: MEDICARE | Primary: Geriatric Medicine

## 2017-03-25 NOTE — Progress Notes (Signed)
Physical Therapy Daily Treatment       Patient: [name] [name]  Patient DOB verified :Yes  Visit #: 11 of 16  Total Treatment Time: 55 minutes    Treatment Area: vestibular system    Subjective:     Subjective functional status/changes: Pt reports feeling tired today and a little more off balance than usual    Pain Level (0-10 scale) current: 0/10  Dizzy Scale 0/10    Objective:     Treatment Program:  -Doctor, hospital 12' for maze program  Performed both with 0 UE support 100% of time lv 4, lv 4 to 3 matching game 5x5, maintained neutral BOS throughout with more difficulty, no VC for weight shifting instead of body moving of COG, alternated target touching B UE   -Cone pick up off floor to reach overhead in diagonals and performing head turns x8 cones.  -Gait with head turns vertical and horizontal planes, slow turns 1 set of turn for 2-3 step cadence ea,  Required no cueing overall for arm swing, no ataxia veering out of normal path   -Cone taps 1 to 3 x10' total with pt cautious at first despite good control during SLS, no LOB overreactions noted during this, performed on foam square  -Bosu VOR x1 and gaze tracking with cognitive cards x5' total, mild sway  -Gait with NBOS, VC for gaze forward instead of down, able to step self correct, required much less VC and TC for arm swing  -Foam obstacle course x6 with UE support 20% of time, continued bosu today, held cone balance ball in hand throughout  -Bosu , unstable side down x10 squats hand support 30% of time  -Bosu stance EO/EC, mod ataxic with EC    Dizzy Scale NR at end    Edu to tell family to encourage arm swing with gait  Edu to slow down head turns at home to be able to tolerate x60' instructed to perform with noise distractions  Rationale for the above treatment:Improve Mobility    Patient Education:    Barriers to Learning/Limitations: none   Education provided to patient on safe mobility, and HEP progressed to having busy background now    Patient / Family readiness to learn indicated by: Demonstrates and Verbalizes      Assessment:     Pt with improved subjective feeling at home reporting she went to a play and movies for the first time in a long time with family this past weekend. Improved stability during foam obstacle course and improved VOR response, now tolerating on Bosu instead of foam. Pt progressing very well to be d/c soon.    Plan:     Continue the above treatment program, making adjustments as needed, to further progress patient to meet the stated goals.

## 2017-03-27 ENCOUNTER — Inpatient Hospital Stay: Admit: 2017-03-27 | Payer: MEDICARE | Primary: Geriatric Medicine

## 2017-03-27 NOTE — Progress Notes (Signed)
Physical Therapy Daily Treatment       Patient: [name] [name]  Patient DOB verified :Yes  Visit #: 12 of 16  Total Treatment Time: 55 minutes    Treatment Area: vestibular system    Subjective:     Subjective functional status/changes: Pt reports feeling good, allergist office gave her a  Little dysequilibrium,     Pain Level (0-10 scale) current: 0/10  Dizzy Scale 0/10    Objective:     Treatment Program:  -Balance Machine training 15' for maze program lv4 then 3 Performed both with 0 UE support 100% of time lv  3 memory game 4x4, maintained neutral BOS throughout with more difficulty, no VC for weight shifting instead of body moving of COG, alternated target touching B UE   -Cone pick up off various floor locations to reach overhead in diagonals and performing head turns x8 cones.  -Gait with head turns vertical and horizontal planes, slow turns 1 set of turn for 2-3 step cadence ea,  Required no cueing overall for arm swing, no ataxia veering out of normal path performed in busy loud gym today  -Cone taps 1 to 4 x10' total with pt cautious at first despite good control during SLS, no LOB overreactions noted during this, performed on foam rail  -Foam rail VOR x1 and gaze tracking with cognitive cards x5' total, mild sway  -Gait with NBOS, VC for gaze forward instead of down, able to step self correct, required no VC and TC for arm swing  -Foam obstacle course x6 with UE support 20% of time, flipped bosu today,   -Bosu , unstable side down x10 squats hand support 10% of time  -Bosu stance EO    Dizzy Scale NR at end    Edu to tell family to encourage arm swing with gait  Edu to slow down head turns at home to be able to tolerate x60' instructed to perform with noise distractions  Rationale for the above treatment:Improve Mobility    Patient Education:    Barriers to Learning/Limitations: none   Education provided to patient on safe mobility, and HEP progressed to having busy background now    Patient / Family readiness to learn indicated by: Demonstrates and Verbalizes      Assessment:     Pt with continued improvement in stability and balance throughout treatment session. Pt improves each visit and now has desire to be challenged more often without apprehension during challenging tasks such as bosu tasking. Pt most challenged still in areas of increased external stimuli compared to closed environments.    Plan:     Continue the above treatment program, making adjustments as needed, to further progress patient to meet the stated goals.

## 2017-04-01 ENCOUNTER — Inpatient Hospital Stay: Admit: 2017-04-01 | Payer: MEDICARE | Primary: Geriatric Medicine

## 2017-04-01 NOTE — Progress Notes (Signed)
Physical Therapy Daily Treatment       Patient: [name] [name]  Patient DOB verified :Yes  Visit #: 13 of 16  Total Treatment Time: 55 minutes    Treatment Area: vestibular system    Subjective:     Subjective functional status/changes: Pt reports feeling tired, mentally and physically fatigued from past few days being very busy out and about with family.    Pain Level (0-10 scale) current: 0/10  Dizzy Scale 0/10    Objective:     Treatment Program:  -Balance Machine training 15' for maze program lv4 then 3 Performed both with 0 UE support 100% of time lv  4 memory game 5x4, maintained neutral BOS throughout with more difficulty, no VC for weight shifting instead of body moving of COG, alternated target touching B UE   -Cone pick up off various floor locations to reach overhead in diagonals and performing head turns x8 cones.  -Gait with head turns vertical and horizontal planes, slow turns 1 set of turn for 2-3 step cadence ea,  Required no cueing overall for arm swing, no ataxia veering out of normal path performed in busy loud gym today  -Gait, retro and EC x5' total with improved stability retro with increased distance and veer to either side EC to very little veer.  -Cone taps 1 to 4 x10' total with pt cautious at first despite good control during SLS, no LOB overreactions noted during this, performed on foam rail  -Foam rail VOR x1 and gaze tracking with cognitive cards x5' total, mild sway  -Gait with NBOS, VC for gaze forward instead of down, able to step self correct, required no VC and TC for arm swing  -Foam obstacle course x6 with UE support 20% of time, red foam in middle, added cones as well   -Red foam rail stance EO x1' with minimal hand checking,   -uneven surface squat 2x6 with 1 Le higher than other ea.  -Standing body sways with TC EO to EC x10 ea coronal and sagittal planes        Dizzy Scale NR at end    Edu to tell family to encourage arm swing with gait   Edu to slow down head turns at home to be able to tolerate x60' instructed to perform with noise distractions  Rationale for the above treatment:Improve Mobility    Patient Education:    Barriers to Learning/Limitations: none   Education provided to patient on safe mobility, and HEP progressed to having busy background now   Patient / Family readiness to learn indicated by: Demonstrates and Verbalizes      Assessment:     Pt with continued improvement in stability and balance throughout treatment session. Pt more affected by external stimuli today and improved stability and confidence as session progressed. Pt challenged today by more unstable surface and also ding cone pick up with cones spaced wider from pt BOS.    Plan:     Continue the above treatment program, making adjustments as needed, to further progress patient to meet the stated goals.

## 2017-04-03 ENCOUNTER — Inpatient Hospital Stay: Admit: 2017-04-03 | Payer: MEDICARE | Primary: Geriatric Medicine

## 2017-04-03 NOTE — Progress Notes (Signed)
Physical Therapy Daily Treatment       Patient: [name] [name]  Patient DOB verified :Yes  Visit #: 14 of 16  Total Treatment Time: 55 minutes    Treatment Area: vestibular system    Subjective:     Subjective functional status/changes: Pt reports feeling back to normal    Pain Level (0-10 scale) current: 0/10  Dizzy Scale 0/10    Objective:     Treatment Program:  -Balance Machine training 15' for gaze tracking program lv4 then Performed both with 0 UE support 100% of time lv  4 memory game 5x4, maintained neutral BOS throughout with more difficulty, no VC for weight shifting instead of body moving of COG, alternated target touching B UE   -Cone pick up off various floor locations to reach overhead in diagonals and performing head turns x8 cones.  -Gait with head turns vertical and horizontal planes, slow turns 1 set of turn for 2-3 step cadence ea,  Required no cueing overall for arm swing, no ataxia veering out of normal path performed in busy loud gym today  -Gait, retro and EC x5' total with improved stability retro with increased distance and veer to either side EC to very little veer.  -Cone taps 1 to 4 x10' total with pt cautious at first despite good control during SLS, no LOB overreactions noted during this, performed on foam rail  -Foam red rail VOR x1 and gaze tracking with cognitive cards x5' total, mild sway  -Gait with NBOS, VC for gaze forward instead of down, able to step self correct, required no VC and TC for arm swing  -Foam obstacle course x6 with UE support 20% of time, red foam in middle, added cones as well   -Red foam rail stance EO x1' with minimal hand checking,   -bosu squat x12 with 1 Le higher than other ea.          Dizzy Scale NR at end    Edu to tell family to encourage arm swing with gait  Edu to slow down head turns at home to be able to tolerate x60' instructed to perform with noise distractions  Rationale for the above treatment:Improve Mobility    Patient Education:     Barriers to Learning/Limitations: none   Education provided to patient on safe mobility, and HEP progressed to having busy background now   Patient / Family readiness to learn indicated by: Demonstrates and Verbalizes      Assessment:     Pt with continued improvement in stability and balance throughout treatment session. Pt continues to have increased difficulty in distracted environments but has improved ability to refocus and maintain stability better. Plan on d/c next week after 2 more session.    Plan:     Continue the above treatment program, making adjustments as needed, to further progress patient to meet the stated goals.

## 2017-04-08 ENCOUNTER — Inpatient Hospital Stay: Admit: 2017-04-08 | Payer: MEDICARE | Primary: Geriatric Medicine

## 2017-04-08 DIAGNOSIS — R42 Dizziness and giddiness: Secondary | ICD-10-CM

## 2017-04-08 NOTE — Progress Notes (Signed)
Physical Therapy Daily Treatment       Patient: [name] [name]  Patient DOB verified :Yes  Visit #: 15 of 16  Total Treatment Time: 55 minutes    Treatment Area: vestibular system    Subjective:     Subjective functional status/changes: Pt reports feeling back to normal, a little off today    Pain Level (0-10 scale) current: 0/10  Dizzy Scale 0/10    Objective:     Treatment Program:  -Balance Machine training 15' for gaze tracking program lv4 then Performed both with 0 UE support 100% of time lv  4 racing game, hand checking 25% of time  -Cone pick up RDLs with CGA x3 cones ea LE, increased difficulty maintaining SLS  -Cone Taps on foam rail x3 cones at a time widely spaced  -Gait with head turns vertical and horizontal planes, slow turns 1 set of turn for 2-3 step cadence ea,  Required no cueing overall for arm swing, no ataxia veering out of normal path performed in busy loud gym today  -Gait, retro and EC x5' total with improved stability retro with increased distance and veer to either side EC to very little veer.  -Foam 1/2 rail VOR x1 and gaze tracking with cognitive cards x5' total, mild sway  -Foam obstacle course x10 with UE support 20% of time, red foam in middle, added space and 1/2 foam today  -1/2 foam rail stance EO x1' with minimal hand checking,   -1/2 foam rail squat x12 with 1 Le higher than other ea. CGA min A at times for stability          Dizzy Scale NR at end    Edu to tell family to encourage arm swing with gait  Edu to slow down head turns at home to be able to tolerate x60' instructed to perform with noise distractions  Rationale for the above treatment:Improve Mobility    Patient Education:    Barriers to Learning/Limitations: none   Education provided to patient on safe mobility, and HEP progressed to having busy background now   Patient / Family readiness to learn indicated by: Demonstrates and Verbalizes      Assessment:      Pt more challenged today with new balance surfaces, tend to overreact at times with splayed arms despite good LE control during task and no true LOB. Pt enjoys PT and has been exaggerating more recently seemingly in attempt to continue PT for longer duration despite good improvement in stability. Pt expressed desire to continue PT after d/c to secretary but not to physical therapist today, plan to d/c [place] with HEP and if pt husband who is PTA can attend [place] will include him as well with HEP implementation.     Plan:     Continue the above treatment program, making adjustments as needed, to further progress patient to meet the stated goals.

## 2017-04-10 ENCOUNTER — Inpatient Hospital Stay: Admit: 2017-04-10 | Payer: MEDICARE | Primary: Geriatric Medicine

## 2017-04-10 NOTE — Progress Notes (Signed)
Delayed Certification & Recertification Form      Section A:   Period of Delayed Certification: From 02/12/17 through 05/13/17     Section B:   Patient Name: [name] [name]   HIC#: [id number] - (Medicare)    Start of Care Date: 02/12/17     Section C:   Diagnosis:  Dizziness and giddiness     Section D:   Plan of Care Services were required as outlined in the attached Plan of Care.     Section E:   Skilled therapy services were necessary for vertigo.    Section F:   Estimated duration of need for skilled rehabilitation 90 days.     Section G:   Additional documentation: evaluation    Section H:   Reason for the delay (to be completed by physician):   []  Out of town on due date; completed upon my return   []  Physician illness  [x]  Routing error/700 Form was not received  []  Natural disaster (specify) ___________________________________________________   []  Other (specify) ___________________________________________________________     Section I: Certification Statement   I certify that the above information is true and correct and that the patient required skilled therapy services. I certify that the outpatient skilled therapy services provided were furnished under an appropriate plan of care established by the therapist and while under my care.       ________________________________                                                  ________________  Physician Signature/ License #        Date

## 2017-04-10 NOTE — Progress Notes (Signed)
Delayed Certification & Recertification Form      Section A:   Period of Delayed Certification: From 02/12/17 through 05/13/17     Section B:   Patient Name: [name] [name]   HIC#: [id number] - (Medicare)    Start of Care Date: 02/12/17     Section C:   Diagnosis:  Dizziness and giddiness     Section D:   Plan of Care Services were required as outlined in the attached Plan of Care.     Section E:   Skilled therapy services were necessary for vertigo.    Section F:   Estimated duration of need for skilled rehabilitation 90 days.     Section G:   Additional documentation: evaluation    Section H:   Reason for the delay (to be completed by physician):   [] Out of town on due date; completed upon my return   [] Physician illness  [x] Routing error/700 Form was not received  [] Natural disaster (specify) ___________________________________________________   [] Other (specify) ___________________________________________________________     Section I: Certification Statement   I certify that the above information is true and correct and that the patient required skilled therapy services. I certify that the outpatient skilled therapy services provided were furnished under an appropriate plan of care established by the therapist and while under my care.       ________________________________                                                  ________________  Physician Signature/ License #        Date

## 2017-04-10 NOTE — Progress Notes (Addendum)
Physical Therapy Daily Treatment       Patient: [name] [name]  Patient DOB verified :Yes  Visit #: 16 of 16  Total Treatment Time: 55 minutes    Treatment Area: vestibular system    Subjective:     Subjective functional status/changes: Pt reports feeling back to normal, a little off today may be due to weather     Pain Level (0-10 scale) current: 0/10  Dizzy Scale 0/10      Dizziness Handicap Inventory Questionnaire: 68/100 eval 26/100 today  ??  OBJECTIVE:   ??  ??  Gait Assessment: forward rounded posture, normal gait pattern, minimal arm swing, able to look about environment without ataxia              Head Turns: no ataxia or head discomfort  ??    Oculomotor Testing:          VOR - Fast Head Mvmt    []  Neg     []  Pos    [x]  Left    []  Right  able to perform for x60" with HEP without increase in symptoms       Head Thrust                      []  Neg     []  Pos    [x]  Left    []  Right  mild VOR lag no nausea                               Functional Gait Assess.  [x]  Neg     []  Pos    Score: 28/30          Short Term Goals:              1.Pt will improve DHI by 15 points MET              2. Pt will be able to stand in sharpened rhomberg x30' with EC with min sway  MET              3. Pt will ambulate with normal gait pattern looking up/forward without ataxia x500' and independence MET  ??  Long Term Goals:              1. Pt will increase FGA to 27/30 MET              2.Pt will improve DHI by 30 points MET              3. Pt will be able to ambulate x200' while performing head turns in vertical and horizontal directions without report of dizziness or moderate ataxia and Independence MET        Treatment Program:  -Balance Machine training 15' for gaze maze and game programing, [id number]    -[facility] Taps on double foam rail x3 cones at a time widely spaced   -Gait with head turns vertical and horizontal planes, slow turns 1 set of turn for 2-3 step cadence ea,  Required no cueing overall for arm swing, no ataxia veering out of normal path performed in busy loud gym today  -Gait, retro and EC x5' total with improved stability retro with increased distance and veer to either side EC to very little veer.  -Bosu squats x12  -Bosu stance x8' VOR and gaze tracing          Dizzy Scale  0/10 at end    Edu to tell family to encourage arm swing with gait  Edu to slow down head turns at home edu x10 for reading program to improved tolerance to reading, exercise progression and distrubution and return demo of learning of no HEP handouts given.  Rationale for the above treatment:Improve Mobility    Patient Education:    Barriers to Learning/Limitations: none   Education provided to patient on safe mobility, and HEP progressed to having busy background now   Patient / Family readiness to learn indicated by: Demonstrates and Verbalizes      Assessment:     Pt has met all goals set forth at initial evaluation and now is able to be discharged from skilled PT. See detailed discharge summary for more information.    Plan:     D/c from skilled PT at this time.

## 2017-04-10 NOTE — Progress Notes (Signed)
[address], [place], [place]  [phone]  _______________________________________________________________________________    PHYSICAL THERAPY   DISCHARGE SUMMARY    Patient Name :  [name] [name]   DOB:    May 12, 1970    Physician Name:  Tressie Stalker, MD  Diagnosis:   Dizziness and giddiness [R42]  Treatment Date(s):  02/12/17 to 04/10/17 (16 visits)  Treatment Program: Theraputic Exercise  Gait and Balance Training  Teaching of a HEP    Patient Progress Pt progressed very well throughout physical therapy and vestibular rehab. 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 Pt objectively demonstrated poor tolerance to head movements and reduce gait, balance, and mobility overall and now is able to ambulate with normal gait pattern, look about environment and has no ataxia when performing normal everyday gait tasks. Pt has met all goals set forth at evaluation and self reported great improvement in self of well being and able to participate with more social activities than before. Pt will continue progress with PTA husband at home via HEP and plans to look into yoga as well.    Functional Gait Assess.????[] ??Neg ????????[x] ??Pos ??????Score:??27/30  ??Dizziness Handicap Inventory Questionnaire:??68/100 eval 26/100 today      ??  Short Term Goals:  ????????????????????????1.Pt will improve DHI by 15 points MET  ????????????????????????2. Pt will be able to stand in sharpened rhomberg x30' with EC with min sway  MET  ????????????????????????3. Pt will ambulate with normal gait pattern looking up/forward without ataxia x500' and independence MET  ??  Long Term Goals:  ????????????????????????1. Pt will increase FGA to 27/30 MET  ????????????????????????2.Pt will improve DHI by 30 points MET   ????????????????????????3. Pt will be able to ambulate x200' while performing head turns in vertical and horizontal directions without report of dizziness or moderate ataxia and Independence MET        Recommendations D/C from vestibular skilled PT       If you have any question, please do not hesitate to contact me at [phone].       Signature: ____________________________       Date: ___________________          Bridgett Larsson, PT, DPT                                            TO BE COMPLETED BY PHYSICIAN                1. _____X__ Discontinue Physical Therapy   2. _______ Continue Physical Therapy as above for _____ weeks.   3. _______ Continue Physical Therapy and change the treatment                      as follows: ____________________________________      Physician Signature ___________________________           Date _________________    Please sign, date and return to [place]

## 2017-08-20 ENCOUNTER — Encounter

## 2017-08-26 ENCOUNTER — Inpatient Hospital Stay: Admit: 2017-08-26 | Payer: MEDICARE | Primary: Geriatric Medicine

## 2017-08-26 DIAGNOSIS — G35 Multiple sclerosis: Secondary | ICD-10-CM

## 2017-08-26 LAB — CBC WITH AUTO DIFFERENTIAL
Basophils %: 0.5 % (ref 0–3)
Eosinophils %: 2.5 % (ref 0–5)
Hematocrit: 39.6 % (ref 37.0–50.0)
Hemoglobin: 12.4 gm/dl — ABNORMAL LOW (ref 13.0–17.2)
Immature Granulocytes: 0.2 % (ref 0.0–3.0)
Lymphocytes %: 38.5 % (ref 28–48)
MCH: 29.2 pg (ref 25.4–34.6)
MCHC: 31.3 gm/dl (ref 30.0–36.0)
MCV: 93.4 fL (ref 80.0–98.0)
MPV: 10.6 fL — ABNORMAL HIGH (ref 6.0–10.0)
Monocytes %: 7 % (ref 1–13)
Neutrophils %: 51.3 % (ref 34–64)
Nucleated RBCs: 0 (ref 0–0)
Platelets: 350 10*3/uL (ref 140–450)
RBC: 4.24 M/uL (ref 3.60–5.20)
RDW-SD: 45.3 (ref 36.4–46.3)
WBC: 6 10*3/uL (ref 4.0–11.0)

## 2017-08-26 LAB — PROTIME-INR
INR: 1.1 (ref 0.1–1.1)
Protime: 12.7 seconds (ref 10.2–12.9)

## 2017-08-26 LAB — APTT: aPTT: 34.6 seconds (ref 25.1–36.5)

## 2017-08-26 LAB — CBC WITH AUTOMATED DIFF
BASOPHILS: 0.5 % (ref 0–3)
EOSINOPHILS: 2.5 % (ref 0–5)
HCT: 39.6 % (ref 37.0–50.0)
HGB: 12.4 gm/dl — ABNORMAL LOW (ref 13.0–17.2)
IMMATURE GRANULOCYTES: 0.2 % (ref 0.0–3.0)
LYMPHOCYTES: 38.5 % (ref 28–48)
MCH: 29.2 pg (ref 25.4–34.6)
MCHC: 31.3 gm/dl (ref 30.0–36.0)
MCV: 93.4 fL (ref 80.0–98.0)
MONOCYTES: 7 % (ref 1–13)
MPV: 10.6 fL — ABNORMAL HIGH (ref 6.0–10.0)
NEUTROPHILS: 51.3 % (ref 34–64)
NRBC: 0 (ref 0–0)
PLATELET: 350 10*3/uL (ref 140–450)
RBC: 4.24 M/uL (ref 3.60–5.20)
RDW-SD: 45.3 (ref 36.4–46.3)
WBC: 6 10*3/uL (ref 4.0–11.0)

## 2017-08-26 LAB — PROTHROMBIN TIME + INR
INR: 1.1 (ref 0.1–1.1)
Prothrombin time: 12.7 seconds (ref 10.2–12.9)

## 2017-08-26 LAB — PTT: aPTT: 34.6 seconds (ref 25.1–36.5)

## 2017-08-27 ENCOUNTER — Inpatient Hospital Stay: Admit: 2017-08-27 | Payer: MEDICARE | Attending: Diagnostic Radiology | Primary: Geriatric Medicine

## 2017-08-27 DIAGNOSIS — G35 Multiple sclerosis: Secondary | ICD-10-CM

## 2017-08-27 LAB — CELL COUNT, CSF
CSF RBCs: 0 (ref 0–10)
CSF RBCs: 0 (ref 0–10)
CSF Rbcs: 0 (ref 0–10)
CSF Rbcs: 0 (ref 0–10)
CSF WBCs: 5 (ref 0–10)
CSF WBCs: 5 (ref 0–10)
CSF WBCs: 6 (ref 0–10)
CSF WBCs: 6 (ref 0–10)

## 2017-08-27 LAB — GLUCOSE, CSF
Glucose, CSF: 62 mg/dl (ref 40–70)
Glucose,CSF: 62 mg/dl (ref 40–70)

## 2017-08-27 LAB — PROTEIN, CSF
Protein, CSF: 33.8 mg/dl (ref 15.0–45.0)
Protein,CSF: 33.8 mg/dl (ref 15.0–45.0)

## 2017-08-27 LAB — CRYPTOCOCCUS AG TITER, CSF: Cryptococcus Ag, CSF: NEGATIVE

## 2017-08-27 MED ORDER — LIDOCAINE (PF) 10 MG/ML (1 %) IJ SOLN
10 mg/mL (1 %) | Freq: Once | INTRAMUSCULAR | Status: AC
Start: 2017-08-27 — End: 2017-08-27
  Administered 2017-08-27: 17:00:00 via SUBCUTANEOUS

## 2017-08-27 MED ORDER — ACETAMINOPHEN 325 MG TABLET
325 mg | Freq: Once | ORAL | Status: DC | PRN
Start: 2017-08-27 — End: 2017-08-27

## 2017-08-28 LAB — IMMUNOFIXATION

## 2017-08-28 LAB — VDRL, CSF: VDRL,CSF: NONREACTIVE

## 2017-08-29 LAB — PROTEIN ELECTROPHORESIS
ABNORMAL PROTEIN BAND 1: NOT DETECTED
ALPHA-1-GLOBULIN: 0.3 g/dL (ref 0.2–0.3)
ALPHA-2 GLOBULIN: 0.7 g/dL (ref 0.5–0.9)
Abnormal Protein Band 1: NOT DETECTED
Albumin: 4.1 g/dL (ref 3.8–4.8)
Albumin: 4.1 g/dL (ref 3.8–4.8)
AlpHa-2 Globulin: 0.7 g/dL (ref 0.5–0.9)
Alpha-1-globulin: 0.3 g/dL (ref 0.2–0.3)
BETA 1 GLOBULIN: 0.4 g/dL (ref 0.4–0.6)
BETA 2 GLOBULIN: 0.3 g/dL (ref 0.2–0.5)
Beta 1 Globulin: 0.4 g/dL (ref 0.4–0.6)
Beta 2 Globulin: 0.3 g/dL (ref 0.2–0.5)
GAMMA GLOBULIN: 0.9 g/dL (ref 0.8–1.7)
Gamma globulin: 0.9 g/dL (ref 0.8–1.7)
INTERPRETATION (SEE BELOW): NORMAL
Interpretation (see below): NORMAL
Protein, total: 6.7 g/dL (ref 6.1–8.1)
Total Protein: 6.7 g/dL (ref 6.1–8.1)

## 2017-08-31 ENCOUNTER — Inpatient Hospital Stay
Admit: 2017-08-31 | Discharge: 2017-09-01 | Disposition: A | Discharge status: Home / Self Care | Payer: MEDICARE | Attending: Emergency Medicine

## 2017-08-31 DIAGNOSIS — G971 Other reaction to spinal and lumbar puncture: Secondary | ICD-10-CM

## 2017-08-31 LAB — BASIC METABOLIC PANEL
Anion Gap: 5 mmol/L (ref 5–15)
BUN: 15 mg/dl (ref 7–25)
CO2: 25 mEq/L (ref 21–32)
Calcium: 8.9 mg/dl (ref 8.5–10.1)
Chloride: 114 mEq/L — ABNORMAL HIGH (ref 98–107)
Creatinine: 1.2 mg/dl (ref 0.6–1.3)
EGFR IF NonAfrican American: 51
GFR African American: >60
Glucose: 85 mg/dl (ref 74–106)
Potassium: 3.8 mEq/L (ref 3.5–5.1)
Sodium: 144 mEq/L (ref 136–145)

## 2017-08-31 LAB — CBC WITH AUTO DIFFERENTIAL
Basophils %: 0.7 % (ref 0–3)
Eosinophils %: 2.2 % (ref 0–5)
Hematocrit: 40.8 % (ref 37.0–50.0)
Hemoglobin: 13 gm/dl (ref 13.0–17.2)
Immature Granulocytes: 0.1 % (ref 0.0–3.0)
Lymphocytes %: 43 % (ref 28–48)
MCH: 29.9 pg (ref 25.4–34.6)
MCHC: 31.9 gm/dl (ref 30.0–36.0)
MCV: 93.8 fL (ref 80.0–98.0)
MPV: 11.3 fL — ABNORMAL HIGH (ref 6.0–10.0)
Monocytes %: 5.3 % (ref 1–13)
Neutrophils %: 48.7 % (ref 34–64)
Nucleated RBCs: 0 (ref 0–0)
Platelet Comment: NORMAL
Platelets: 261 10*3/uL (ref 140–450)
RBC: 4.35 M/uL (ref 3.60–5.20)
RDW-SD: 45.1 (ref 36.4–46.3)
WBC: 7.1 10*3/uL (ref 4.0–11.0)

## 2017-08-31 LAB — CBC WITH AUTOMATED DIFF
BASOPHILS: 0.7 % (ref 0–3)
EOSINOPHILS: 2.2 % (ref 0–5)
HCT: 40.8 % (ref 37.0–50.0)
HGB: 13 gm/dl (ref 13.0–17.2)
IMMATURE GRANULOCYTES: 0.1 % (ref 0.0–3.0)
LYMPHOCYTES: 43 % (ref 28–48)
MCH: 29.9 pg (ref 25.4–34.6)
MCHC: 31.9 gm/dl (ref 30.0–36.0)
MCV: 93.8 fL (ref 80.0–98.0)
MONOCYTES: 5.3 % (ref 1–13)
MPV: 11.3 fL — ABNORMAL HIGH (ref 6.0–10.0)
NEUTROPHILS: 48.7 % (ref 34–64)
NRBC: 0 (ref 0–0)
PLATELET COMMENTS: NORMAL
PLATELET: 261 10*3/uL (ref 140–450)
RBC: 4.35 M/uL (ref 3.60–5.20)
RDW-SD: 45.1 (ref 36.4–46.3)
WBC: 7.1 10*3/uL (ref 4.0–11.0)

## 2017-08-31 LAB — METABOLIC PANEL, BASIC
Anion gap: 5 mmol/L (ref 5–15)
BUN: 15 mg/dl (ref 7–25)
CO2: 25 mEq/L (ref 21–32)
Calcium: 8.9 mg/dl (ref 8.5–10.1)
Chloride: 114 mEq/L — ABNORMAL HIGH (ref 98–107)
Creatinine: 1.2 mg/dl (ref 0.6–1.3)
GFR est AA: >60
GFR est non-AA: 51
Glucose: 85 mg/dl (ref 74–106)
Potassium: 3.8 mEq/L (ref 3.5–5.1)
Sodium: 144 mEq/L (ref 136–145)

## 2017-08-31 MED ORDER — PROCHLORPERAZINE EDISYLATE 5 MG/ML INJECTION
5 mg/mL | INTRAMUSCULAR | Status: AC
Start: 2017-08-31 — End: 2017-08-31
  Administered 2017-08-31: 21:00:00 via INTRAVENOUS

## 2017-08-31 MED ORDER — DIPHENHYDRAMINE HCL 50 MG/ML IJ SOLN
50 mg/mL | INTRAMUSCULAR | Status: AC
Start: 2017-08-31 — End: 2017-08-31
  Administered 2017-08-31: 21:00:00 via INTRAVENOUS

## 2017-08-31 MED ORDER — SODIUM CHLORIDE 0.9% BOLUS IV
0.9 % | INTRAVENOUS | Status: AC
Start: 2017-08-31 — End: 2017-08-31
  Administered 2017-08-31: 21:00:00 via INTRAVENOUS

## 2017-08-31 MED ORDER — SODIUM CHLORIDE 0.9% BOLUS IV
0.9 % | INTRAVENOUS | Status: AC
Start: 2017-08-31 — End: 2017-08-31
  Administered 2017-08-31: via INTRAVENOUS

## 2017-08-31 MED FILL — DIPHENHYDRAMINE HCL 50 MG/ML IJ SOLN: 50 mg/mL | INTRAMUSCULAR | Qty: 1

## 2017-08-31 MED FILL — PROCHLORPERAZINE EDISYLATE 5 MG/ML INJECTION: 5 mg/mL | INTRAMUSCULAR | Qty: 2

## 2017-08-31 NOTE — ED Notes (Signed)
9:04 PM  08/31/17     Discharge instructions given to patient (name) with verbalization of understanding. Patient accompanied by spouse.  Patient discharged with the following prescriptions none. Patient discharged to home (destination).      [name] [name] Agar Shon Hale Voncille Lo

## 2017-08-31 NOTE — ED Notes (Signed)
Completed second liter of saline bag. Ambulated patient around the hall. Denies recurring headache or dizziness. MD Leretha Pol made aware and spoke with pt at bedside.

## 2017-08-31 NOTE — ED Provider Notes (Signed)
HPI   47 year old female with history of fibromyalgia who comes to the ED due to headache.  Patient states that she had a lumbar puncture 4 days ago and was instructed to lay down and drink coffee for 2 days.  Patient states that 2 days ago she started standing up and every time she stands up she feels generalized throbbing headache (8/10) associated to nausea.  Patient has been taking her medications at home with no improvement and decided to come to the ED for evaluation.  Patient denies fever, vomiting, numbness or tingling sensation, neck pain, dizziness, weakness, blurred vision, or other complaints.  At this moment patient states that if she stays laying down she does not have headache.   Past Medical History:   Diagnosis Date   ??? Chronic kidney disease     stage 3   ??? Fibromyalgia    ??? Palate mass        Past Surgical History:   Procedure Laterality Date   ??? HX APPENDECTOMY     ??? HX CESAREAN SECTION     ??? HX HERNIA REPAIR      left   ??? HX OTHER SURGICAL      jaw surgery   ??? HX OVARIAN CYST REMOVAL     ??? HX SEPTOPLASTY     ??? HX TUBAL LIGATION           Family History:   Problem Relation Age of Onset   ??? Kidney Disease Mother    ??? Depression Mother    ??? Heart Disease Father        Social History     Socioeconomic History   ??? Marital status: MARRIED     Spouse name: Not on file   ??? Number of children: Not on file   ??? Years of education: Not on file   ??? Highest education level: Not on file   Occupational History   ??? Not on file   Social Needs   ??? Financial resource strain: Not on file   ??? Food insecurity:     Worry: Not on file     Inability: Not on file   ??? Transportation needs:     Medical: Not on file     Non-medical: Not on file   Tobacco Use   ??? Smoking status: Never Smoker   Substance and Sexual Activity   ??? Alcohol use: No   ??? Drug use: No   ??? Sexual activity: Not on file   Lifestyle   ??? Physical activity:     Days per week: Not on file     Minutes per session: Not on file   ??? Stress: Not on file    Relationships   ??? Social connections:     Talks on phone: Not on file     Gets together: Not on file     Attends religious service: Not on file     Active member of club or organization: Not on file     Attends meetings of clubs or organizations: Not on file     Relationship status: Not on file   ??? Intimate partner violence:     Fear of current or ex partner: Not on file     Emotionally abused: Not on file     Physically abused: Not on file     Forced sexual activity: Not on file   Other Topics Concern   ??? Not on file   Social History Narrative   ???  Not on file         ALLERGIES: Augmentin [amoxicillin-pot clavulanate] and Demerol [meperidine]    Review of Systems   Constitutional: Negative.  Negative for fever.   HENT: Negative.    Eyes: Negative.    Respiratory: Negative.    Cardiovascular: Negative.    Gastrointestinal: Positive for nausea. Negative for abdominal pain and vomiting.   Genitourinary: Negative for flank pain.   Musculoskeletal: Negative.  Negative for back pain and neck pain.   Skin: Negative.    Neurological: Positive for headaches. Negative for dizziness, weakness and numbness.   All other systems reviewed and are negative.      Vitals:    08/31/17 1540 08/31/17 1546   BP:  121/75   Pulse:  91   Resp:  16   Temp:  97.6 ??F (36.4 ??C)   SpO2:  97%   Weight: 55.8 kg (123 lb) 55.8 kg (123 lb 0.3 oz)   Height: 4\' 11"  (1.499 m) 3\' 8"  (1.118 m)            Physical Exam   Constitutional: She is oriented to person, place, and time. She appears well-developed and well-nourished.   HENT:   Head: Normocephalic and atraumatic.   Eyes: Pupils are equal, round, and reactive to light. Conjunctivae and EOM are normal.   Neck: Normal range of motion and full passive range of motion without pain. Neck supple. No spinous process tenderness and no muscular tenderness present. No neck rigidity. Normal range of motion present. No Brudzinski's sign and no Kernig's sign noted.   Cardiovascular: Normal rate, regular  rhythm, normal heart sounds and intact distal pulses.   Pulses:       Dorsalis pedis pulses are 3+ on the right side, and 3+ on the left side.   Pulmonary/Chest: Effort normal and breath sounds normal.   Abdominal: Soft. Bowel sounds are normal. She exhibits no distension. There is no tenderness. There is no rebound and no guarding.   Musculoskeletal: Normal range of motion. She exhibits no edema or tenderness.   Neurological: She is alert and oriented to person, place, and time. She has normal strength and normal reflexes. No cranial nerve deficit or sensory deficit. Coordination normal. GCS eye subscore is 4. GCS verbal subscore is 5. GCS motor subscore is 6.   Skin: Skin is warm and dry.   Nursing note and vitals reviewed.       MDM   Recent Results (from the past 12 hour(s))   CBC WITH AUTOMATED DIFF    Collection Time: 08/31/17  4:12 PM   Result Value Ref Range    WBC 7.1 4.0 - 11.0 1000/mm3    RBC 4.35 3.60 - 5.20 M/uL    HGB 13.0 13.0 - 17.2 gm/dl    HCT 16.1 09.6 - 04.5 %    MCV 93.8 80.0 - 98.0 fL    MCH 29.9 25.4 - 34.6 pg    MCHC 31.9 30.0 - 36.0 gm/dl    PLATELET 409 811 - 914 1000/mm3    MPV 11.3 (H) 6.0 - 10.0 fL    RDW-SD 45.1 36.4 - 46.3      NRBC 0 0 - 0      IMMATURE GRANULOCYTES 0.1 0.0 - 3.0 %    NEUTROPHILS 48.7 34 - 64 %    LYMPHOCYTES 43.0 28 - 48 %    MONOCYTES 5.3 1 - 13 %    EOSINOPHILS 2.2 0 - 5 %  BASOPHILS 0.7 0 - 3 %    PLATELET COMMENTS NORMAL      Large platelets OCCASIONAL     METABOLIC PANEL, BASIC    Collection Time: 08/31/17  4:12 PM   Result Value Ref Range    Sodium 144 136 - 145 mEq/L    Potassium 3.8 3.5 - 5.1 mEq/L    Chloride 114 (H) 98 - 107 mEq/L    CO2 25 21 - 32 mEq/L    Glucose 85 74 - 106 mg/dl    BUN 15 7 - 25 mg/dl    Creatinine 1.2 0.6 - 1.3 mg/dl    GFR est AA >67.6      GFR est non-AA 51      Calcium 8.9 8.5 - 10.1 mg/dl    Anion gap 5 5 - 15 mmol/L         5:42 PM Blood work-up is unremarkable.  Upon reevaluation patient reports feeling better after treatment,  no headache.  I will order road test to assess her headache when she stands up.     6:21 PM After patient stood up headache came back but not as bad as it was.  I will order another IV fluids and reassess.    8:52 PM Upon reevaluation patient states that she feels much better, no headache when she walks. Patient is comfortable in no distress and nontoxic.  No evidence of severe electrolyte imbalance.  No suspicion of meningitis, spinal abscess.  Most likely symptoms are related to post lumbar headache.  Patient will be discharged home with instructions to follow with the primary doctor and to return to the ED if no improvement or worsening of the condition.  Patient understood and agreed    ProceduresN/A          ICD-10-CM ICD-9-CM   1. Headache, post-lumbar puncture G97.1 349.0

## 2017-08-31 NOTE — ED Notes (Signed)
 Pt arrived to ER from home with c/o headache and nausea that onset on Friday. Pt had LP done on Wednesday. Pt states she feels fine while laying down, but when she moves it gets worse.  Pt has not taken any OTCs today, but did take APAP and her migraine medicine yesterday but nothing helped.

## 2017-08-31 NOTE — ED Triage Notes (Signed)
Pt arrived to ER from home with c/o headache and nausea that onset on Friday. Pt had LP done on Wednesday. Pt states she feels "fine while laying down, but when she moves it gets worse".  Pt has not taken any OTCs today, but did take APAP and her migraine medicine yesterday "but nothing helped".

## 2017-08-31 NOTE — ED Notes (Signed)
9:04 PM  08/31/17     Discharge instructions given to patient (name) with verbalization of understanding. Patient accompanied by spouse.  Patient discharged with the following prescriptions none. Patient discharged to home (destination).      [name] [name]

## 2017-08-31 NOTE — ED Notes (Signed)
Completed second liter of saline bag. Ambulated patient around the hall. Denies recurring headache or dizziness. MD Santiago made aware and spoke with pt at bedside.

## 2017-08-31 NOTE — ED Provider Notes (Signed)
HPI   47 year old female with history of fibromyalgia who comes to the ED due to headache.  Patient states that she had a lumbar puncture 4 days ago and was instructed to lay down and drink coffee for 2 days.  Patient states that 2 days ago she started standing up and every time she stands up she feels generalized throbbing headache (8/10) associated to nausea.  Patient has been taking her medications at home with no improvement and decided to come to the ED for evaluation.  Patient denies fever, vomiting, numbness or tingling sensation, neck pain, dizziness, weakness, blurred vision, or other complaints.  At this moment patient states that if she stays laying down she does not have headache.   Past Medical History:   Diagnosis Date   ??? Chronic kidney disease     stage 3   ??? Fibromyalgia    ??? Palate mass        Past Surgical History:   Procedure Laterality Date   ??? HX APPENDECTOMY     ??? HX CESAREAN SECTION     ??? HX HERNIA REPAIR      left   ??? HX OTHER SURGICAL      jaw surgery   ??? HX OVARIAN CYST REMOVAL     ??? HX SEPTOPLASTY     ??? HX TUBAL LIGATION           Family History:   Problem Relation Age of Onset   ??? Kidney Disease Mother    ??? Depression Mother    ??? Heart Disease Father        Social History     Socioeconomic History   ??? Marital status: MARRIED     Spouse name: Not on file   ??? Number of children: Not on file   ??? Years of education: Not on file   ??? Highest education level: Not on file   Occupational History   ??? Not on file   Social Needs   ??? Financial resource strain: Not on file   ??? Food insecurity:     Worry: Not on file     Inability: Not on file   ??? Transportation needs:     Medical: Not on file     Non-medical: Not on file   Tobacco Use   ??? Smoking status: Never Smoker   Substance and Sexual Activity   ??? Alcohol use: No   ??? Drug use: No   ??? Sexual activity: Not on file   Lifestyle   ??? Physical activity:     Days per week: Not on file     Minutes per session: Not on file   ??? Stress: Not on file    Relationships   ??? Social connections:     Talks on phone: Not on file     Gets together: Not on file     Attends religious service: Not on file     Active member of club or organization: Not on file     Attends meetings of clubs or organizations: Not on file     Relationship status: Not on file   ??? Intimate partner violence:     Fear of current or ex partner: Not on file     Emotionally abused: Not on file     Physically abused: Not on file     Forced sexual activity: Not on file   Other Topics Concern   ??? Not on file   Social History Narrative   ???  Not on file         ALLERGIES: Augmentin [amoxicillin-pot clavulanate] and Demerol [meperidine]    Review of Systems   Constitutional: Negative.  Negative for fever.   HENT: Negative.    Eyes: Negative.    Respiratory: Negative.    Cardiovascular: Negative.    Gastrointestinal: Positive for nausea. Negative for abdominal pain and vomiting.   Genitourinary: Negative for flank pain.   Musculoskeletal: Negative.  Negative for back pain and neck pain.   Skin: Negative.    Neurological: Positive for headaches. Negative for dizziness, weakness and numbness.   All other systems reviewed and are negative.      Vitals:    08/31/17 1540 08/31/17 1546   BP:  121/75   Pulse:  91   Resp:  16   Temp:  97.6 ??F (36.4 ??C)   SpO2:  97%   Weight: 55.8 kg (123 lb) 55.8 kg (123 lb 0.3 oz)   Height: 4\' 11"  (1.499 m) 3\' 8"  (1.118 m)            Physical Exam   Constitutional: She is oriented to person, place, and time. She appears well-developed and well-nourished.   HENT:   Head: Normocephalic and atraumatic.   Eyes: Pupils are equal, round, and reactive to light. Conjunctivae and EOM are normal.   Neck: Normal range of motion and full passive range of motion without pain. Neck supple. No spinous process tenderness and no muscular tenderness present. No neck rigidity. Normal range of motion present. No Brudzinski's sign and no Kernig's sign noted.    Cardiovascular: Normal rate, regular rhythm, normal heart sounds and intact distal pulses.   Pulses:       Dorsalis pedis pulses are 3+ on the right side, and 3+ on the left side.   Pulmonary/Chest: Effort normal and breath sounds normal.   Abdominal: Soft. Bowel sounds are normal. She exhibits no distension. There is no tenderness. There is no rebound and no guarding.   Musculoskeletal: Normal range of motion. She exhibits no edema or tenderness.   Neurological: She is alert and oriented to person, place, and time. She has normal strength and normal reflexes. No cranial nerve deficit or sensory deficit. Coordination normal. GCS eye subscore is 4. GCS verbal subscore is 5. GCS motor subscore is 6.   Skin: Skin is warm and dry.   Nursing note and vitals reviewed.       MDM   Recent Results (from the past 12 hour(s))   CBC WITH AUTOMATED DIFF    Collection Time: 08/31/17  4:12 PM   Result Value Ref Range    WBC 7.1 4.0 - 11.0 1000/mm3    RBC 4.35 3.60 - 5.20 M/uL    HGB 13.0 13.0 - 17.2 gm/dl    HCT 10.2 72.5 - 36.6 %    MCV 93.8 80.0 - 98.0 fL    MCH 29.9 25.4 - 34.6 pg    MCHC 31.9 30.0 - 36.0 gm/dl    PLATELET 440 347 - 425 1000/mm3    MPV 11.3 (H) 6.0 - 10.0 fL    RDW-SD 45.1 36.4 - 46.3      NRBC 0 0 - 0      IMMATURE GRANULOCYTES 0.1 0.0 - 3.0 %    NEUTROPHILS 48.7 34 - 64 %    LYMPHOCYTES 43.0 28 - 48 %    MONOCYTES 5.3 1 - 13 %    EOSINOPHILS 2.2 0 - 5 %  BASOPHILS 0.7 0 - 3 %    PLATELET COMMENTS NORMAL      Large platelets OCCASIONAL     METABOLIC PANEL, BASIC    Collection Time: 08/31/17  4:12 PM   Result Value Ref Range    Sodium 144 136 - 145 mEq/L    Potassium 3.8 3.5 - 5.1 mEq/L    Chloride 114 (H) 98 - 107 mEq/L    CO2 25 21 - 32 mEq/L    Glucose 85 74 - 106 mg/dl    BUN 15 7 - 25 mg/dl    Creatinine 1.2 0.6 - 1.3 mg/dl    GFR est AA >16.1      GFR est non-AA 51      Calcium 8.9 8.5 - 10.1 mg/dl    Anion gap 5 5 - 15 mmol/L          5:42 PM Blood work-up is unremarkable.  Upon reevaluation patient reports feeling better after treatment, no headache.  I will order road test to assess her headache when she stands up.     6:21 PM After patient stood up headache came back but not as bad as it was.  I will order another IV fluids and reassess.    8:52 PM Upon reevaluation patient states that she feels much better, no headache when she walks. Patient is comfortable in no distress and nontoxic.  No evidence of severe electrolyte imbalance.  No suspicion of meningitis, spinal abscess.  Most likely symptoms are related to post lumbar headache.  Patient will be discharged home with instructions to follow with the primary doctor and to return to the ED if no improvement or worsening of the condition.  Patient understood and agreed    ProceduresN/A          ICD-10-CM ICD-9-CM   1. Headache, post-lumbar puncture G97.1 349.0

## 2017-09-02 LAB — CULTURE, CSF W GRAM STAIN
CULTURE RESULT: NO GROWTH
Culture result: NO GROWTH

## 2017-09-02 LAB — MULTIPLE SCLEROSIS PANEL
Albumin, CSF: 14 mg/dL (ref 8.0–42.0)
Albumin, Serum: 4 g/dL (ref 3.5–5.2)
IMMUNOGLOBULIN G: 865 mg/dL (ref 600–1640)
IgG Index, CSF: 0.53 (ref <0.66)
IgG, CSF: 1.6 mg/dL (ref 0.8–7.7)
Myelin Basic Protein: <2 mcg/L (ref 2.0–4.0)
Synthesis Rate IgG, CSF: -2.1 mg/24 h (ref <=3.3)

## 2017-09-26 LAB — CULTURE, FUNGUS & STAIN
FUNGAL STAIN,STN4: NONE SEEN
Fungal stain: NONE SEEN

## 2017-11-18 ENCOUNTER — Inpatient Hospital Stay
Admit: 2017-11-18 | Discharge: 2017-11-18 | Disposition: A | Discharge status: Home / Self Care | Payer: MEDICARE | Attending: Emergency Medicine

## 2017-11-18 DIAGNOSIS — I959 Hypotension, unspecified: Secondary | ICD-10-CM

## 2017-11-18 LAB — POC URINE MACROSCOPIC
Bilirubin, Urine: NEGATIVE
Bilirubin: NEGATIVE
Glucose, Ur: NEGATIVE mg/dl
Glucose: NEGATIVE mg/dl
Ketone: NEGATIVE mg/dl
Ketones, Urine: NEGATIVE mg/dl
Leukocyte Esterase, Urine: NEGATIVE
Leukocyte Esterase: NEGATIVE
Nitrite, Urine: NEGATIVE
Nitrites: NEGATIVE
Protein, UA: NEGATIVE mg/dl
Protein: NEGATIVE mg/dl
Specific Gravity, UA: 1.025 (ref 1.005–1.030)
Specific gravity: 1.025 (ref 1.005–1.030)
Urobilinogen, UA, POCT: 0.2 EU/dl (ref 0.0–1.0)
Urobilinogen: 0.2 EU/dl (ref 0.0–1.0)
pH (UA): 6 (ref 5–9)
pH, UA: 6 (ref 5–9)

## 2017-11-18 LAB — COMPREHENSIVE METABOLIC PANEL
ALT: 16 U/L (ref 12–78)
AST: 12 U/L — ABNORMAL LOW (ref 15–37)
Albumin: 3.7 gm/dl (ref 3.4–5.0)
Alkaline Phosphatase: 97 U/L (ref 45–117)
Anion Gap: 6 mmol/L (ref 5–15)
BUN: 11 mg/dl (ref 7–25)
CO2: 23 mEq/L (ref 21–32)
Calcium: 8.8 mg/dl (ref 8.5–10.1)
Chloride: 113 mEq/L — ABNORMAL HIGH (ref 98–107)
Creatinine: 1 mg/dl (ref 0.6–1.3)
EGFR IF NonAfrican American: >60
GFR African American: >60
Glucose: 93 mg/dl (ref 74–106)
Potassium: 3.9 mEq/L (ref 3.5–5.1)
Sodium: 142 mEq/L (ref 136–145)
Total Bilirubin: 0.2 mg/dl (ref 0.2–1.0)
Total Protein: 7.4 gm/dl (ref 6.4–8.2)

## 2017-11-18 LAB — CBC WITH AUTO DIFFERENTIAL
Basophils %: 0.5 % (ref 0–3)
Eosinophils %: 3.7 % (ref 0–5)
Hematocrit: 35.9 % — ABNORMAL LOW (ref 37.0–50.0)
Hemoglobin: 11.8 gm/dl — ABNORMAL LOW (ref 13.0–17.2)
Immature Granulocytes: 0.3 % (ref 0.0–3.0)
Lymphocytes %: 20.7 % — ABNORMAL LOW (ref 28–48)
MCH: 30.9 pg (ref 25.4–34.6)
MCHC: 32.9 gm/dl (ref 30.0–36.0)
MCV: 94 fL (ref 80.0–98.0)
MPV: 10.3 fL — ABNORMAL HIGH (ref 6.0–10.0)
Monocytes %: 6.8 % (ref 1–13)
Neutrophils %: 68 % — ABNORMAL HIGH (ref 34–64)
Nucleated RBCs: 0 (ref 0–0)
Platelets: 381 10*3/uL (ref 140–450)
RBC: 3.82 M/uL (ref 3.60–5.20)
RDW-SD: 45.1 (ref 36.4–46.3)
WBC: 7.3 10*3/uL (ref 4.0–11.0)

## 2017-11-18 LAB — LIPASE
Lipase: 281 U/L (ref 73–393)
Lipase: 281 U/L (ref 73–393)

## 2017-11-18 LAB — MAGNESIUM
Magnesium: 2.2 mg/dl (ref 1.6–2.6)
Magnesium: 2.2 mg/dl (ref 1.6–2.6)

## 2017-11-18 LAB — POC HCG,URINE
HCG urine, QL: NEGATIVE
Pregnancy Test(Urn): NEGATIVE

## 2017-11-18 LAB — METABOLIC PANEL, COMPREHENSIVE
ALT (SGPT): 16 U/L (ref 12–78)
AST (SGOT): 12 U/L — ABNORMAL LOW (ref 15–37)
Albumin: 3.7 gm/dl (ref 3.4–5.0)
Alk. phosphatase: 97 U/L (ref 45–117)
Anion gap: 6 mmol/L (ref 5–15)
BUN: 11 mg/dl (ref 7–25)
Bilirubin, total: 0.2 mg/dl (ref 0.2–1.0)
CO2: 23 mEq/L (ref 21–32)
Calcium: 8.8 mg/dl (ref 8.5–10.1)
Chloride: 113 mEq/L — ABNORMAL HIGH (ref 98–107)
Creatinine: 1 mg/dl (ref 0.6–1.3)
GFR est AA: >60
GFR est non-AA: >60
Glucose: 93 mg/dl (ref 74–106)
Potassium: 3.9 mEq/L (ref 3.5–5.1)
Protein, total: 7.4 gm/dl (ref 6.4–8.2)
Sodium: 142 mEq/L (ref 136–145)

## 2017-11-18 LAB — CBC WITH AUTOMATED DIFF
BASOPHILS: 0.5 % (ref 0–3)
EOSINOPHILS: 3.7 % (ref 0–5)
HCT: 35.9 % — ABNORMAL LOW (ref 37.0–50.0)
HGB: 11.8 gm/dl — ABNORMAL LOW (ref 13.0–17.2)
IMMATURE GRANULOCYTES: 0.3 % (ref 0.0–3.0)
LYMPHOCYTES: 20.7 % — ABNORMAL LOW (ref 28–48)
MCH: 30.9 pg (ref 25.4–34.6)
MCHC: 32.9 gm/dl (ref 30.0–36.0)
MCV: 94 fL (ref 80.0–98.0)
MONOCYTES: 6.8 % (ref 1–13)
MPV: 10.3 fL — ABNORMAL HIGH (ref 6.0–10.0)
NEUTROPHILS: 68 % — ABNORMAL HIGH (ref 34–64)
NRBC: 0 (ref 0–0)
PLATELET: 381 10*3/uL (ref 140–450)
RBC: 3.82 M/uL (ref 3.60–5.20)
RDW-SD: 45.1 (ref 36.4–46.3)
WBC: 7.3 10*3/uL (ref 4.0–11.0)

## 2017-11-18 MED ORDER — ONDANSETRON (PF) 4 MG/2 ML INJECTION
4 mg/2 mL | Freq: Once | INTRAMUSCULAR | Status: AC
Start: 2017-11-18 — End: 2017-11-18
  Administered 2017-11-18: 16:00:00 via INTRAVENOUS

## 2017-11-18 MED ORDER — SODIUM CHLORIDE 0.9% BOLUS IV
0.9 % | INTRAVENOUS | Status: AC
Start: 2017-11-18 — End: 2017-11-18
  Administered 2017-11-18: 16:00:00 via INTRAVENOUS

## 2017-11-18 MED FILL — ONDANSETRON (PF) 4 MG/2 ML INJECTION: 4 mg/2 mL | INTRAMUSCULAR | Qty: 2

## 2017-11-18 NOTE — ED Notes (Signed)
1:01 PM  11/18/17     Discharge instructions given to pt (name) with verbalization of understanding. Patient accompanied by .  Patient discharged with the following prescriptionsPatient discharged to home (destination).      Melrico Morido, Charity fundraiser

## 2017-11-18 NOTE — ED Provider Notes (Signed)
EMERGENCY DEPARTMENT HISTORY AND PHYSICAL EXAM    11:53 AM      Date: 11/18/2017  Patient Name: [name] [name]    History of Presenting Illness     Chief Complaint   Patient presents with   ??? Fatigue   ??? Nausea         History Provided By: Patient      Additional History (Context): [name] [name] is a 47 y.o. female who presents with means of having a low blood pressure all weekend states she has felt somewhat fatigued she has had some mild nausea she has been drinking salt water and coconut juice with the idea that she needed to get her blood pressure back up.  She states she has had a very mild decrease in appetite with very mild nausea she said no vomiting no diarrhea no chest pain no shortness of breath no syncope.  Patient denies any fevers or chills.  She was concerned that she has had mild renal insufficiency in the past and with her blood pressure being low she wanted to get it checked out.  Social history she denies alcohol tobacco or recreational drug use he was referred to the emergency department for evaluation this morning.    PCP: [name] Kays, MD      Current Outpatient Medications   Medication Sig Dispense Refill   ??? baclofen (LIORESAL) 10 mg tablet Take 10 mg by mouth three (3) times daily. Indications: as needed     ??? montelukast (SINGULAIR) 10 mg tablet Take 10 mg by mouth daily. Indications: Takes generic     ??? Cetirizine (ZYRTEC) 10 mg cap Take  by mouth.     ??? azelastine (ASTEPRO) 0.15 % (205.5 mcg) 2 Sprays two (2) times a day.     ??? fluticasone propionate (FLONASE) 50 mcg/actuation nasal spray 2 Sprays by Both Nostrils route daily.     ??? butalbital-acetaminophen-caffeine (FIORICET, ESGIC) 50-325-40 mg per tablet Take 1 Tab by mouth.     ??? calcium-cholecalciferol, D3, (CALTRATE 600+D) tablet Take 1 Tab by mouth daily.     ??? buPROPion XL (WELLBUTRIN XL) 300 mg XL tablet Take 300 mg by mouth every morning.     ??? MODAFINIL (PROVIGIL PO) Take  by mouth.     ??? gabapentin (NEURONTIN) 600 mg  tablet Take 300 mg by mouth two (2) times a day. Indications: 300 twice daily as needed, 800 mg at bedtime     ??? topiramate (TOPAMAX) 100 mg tablet Take 200 mg by mouth nightly.     ??? traZODone (DESYREL) 100 mg tablet Take 100 mg by mouth nightly.     ??? dicyclomine (BENTYL) 20 mg tablet Take 20 mg by mouth nightly.     ??? albuterol (PROVENTIL HFA, VENTOLIN HFA, PROAIR HFA) 90 mcg/actuation inhaler Take 2 Puffs by inhalation every six (6) hours as needed for Wheezing.         Past History     Past Medical History:  Past Medical History:   Diagnosis Date   ??? Chronic kidney disease     stage 3   ??? Fibromyalgia    ??? Palate mass        Past Surgical History:  Past Surgical History:   Procedure Laterality Date   ??? HX APPENDECTOMY     ??? HX CESAREAN SECTION     ??? HX HERNIA REPAIR      left   ??? HX OTHER SURGICAL  jaw surgery   ??? HX OVARIAN CYST REMOVAL     ??? HX SEPTOPLASTY     ??? HX TUBAL LIGATION         Family History:  Family History   Problem Relation Age of Onset   ??? Kidney Disease Mother    ??? Depression Mother    ??? Heart Disease Father        Social History:  Social History     Tobacco Use   ??? Smoking status: Never Smoker   ??? Smokeless tobacco: Never Used   Substance Use Topics   ??? Alcohol use: No   ??? Drug use: No       Allergies:  Allergies   Allergen Reactions   ??? Augmentin [Amoxicillin-Pot Clavulanate] Diarrhea   ??? Demerol [Meperidine] Hives and Nausea and Vomiting         Review of Systems       Review of Systems   Constitutional: Positive for appetite change and fatigue. Negative for chills, diaphoresis and fever.   HENT: Negative for congestion.    Eyes: Negative for visual disturbance.   Respiratory: Negative for cough, chest tightness and shortness of breath.    Cardiovascular: Negative for chest pain.   Gastrointestinal: Positive for nausea. Negative for abdominal pain, diarrhea and vomiting.   Genitourinary: Positive for pelvic pain. Negative for decreased urine volume.        Pelvic pain since her last.   Not acute today   Musculoskeletal: Negative for back pain.   Skin: Negative for rash.   Neurological: Positive for light-headedness and headaches. Negative for dizziness, syncope and weakness.   All other systems reviewed and are negative.        Physical Exam     Visit Vitals  BP 112/64 (BP Patient Position: Sitting)   Pulse 76   Temp 97.8 ??F (36.6 ??C)   Resp 18   SpO2 100%       Physical Exam   Constitutional: She is oriented to person, place, and time. She appears well-developed and well-nourished. No distress.   Nontoxic-appearing no distress   HENT:   Head: Normocephalic and atraumatic.   Mouth/Throat: Oropharynx is clear and moist.   Eyes: Pupils are equal, round, and reactive to light. Conjunctivae and EOM are normal. No scleral icterus.   Neck: Normal range of motion. Neck supple.   Cardiovascular: Normal rate, regular rhythm and normal heart sounds.   No murmur heard.  Pulmonary/Chest: Effort normal and breath sounds normal. No respiratory distress.   Abdominal: Soft. Bowel sounds are normal. She exhibits no distension. There is no tenderness.   Musculoskeletal: She exhibits no edema.   Lymphadenopathy:     She has no cervical adenopathy.   Neurological: She is alert and oriented to person, place, and time. Coordination normal.   Skin: Skin is warm and dry. No rash noted.   Psychiatric: She has a normal mood and affect. Her behavior is normal.   Nursing note and vitals reviewed.        Diagnostic Study Results     Labs -  Recent Results (from the past 12 hour(s))   CBC WITH AUTOMATED DIFF    Collection Time: 11/18/17 11:45 AM   Result Value Ref Range    WBC 7.3 4.0 - 11.0 1000/mm3    RBC 3.82 3.60 - 5.20 M/uL    HGB 11.8 (L) 13.0 - 17.2 gm/dl    HCT 35.9 (L) 37.0 - 50.0 %  MCV 94.0 80.0 - 98.0 fL    MCH 30.9 25.4 - 34.6 pg    MCHC 32.9 30.0 - 36.0 gm/dl    PLATELET 381 140 - 450 1000/mm3    MPV 10.3 (H) 6.0 - 10.0 fL    RDW-SD 45.1 36.4 - 46.3      NRBC 0 0 - 0      IMMATURE GRANULOCYTES 0.3 0.0 - 3.0 %     NEUTROPHILS 68.0 (H) 34 - 64 %    LYMPHOCYTES 20.7 (L) 28 - 48 %    MONOCYTES 6.8 1 - 13 %    EOSINOPHILS 3.7 0 - 5 %    BASOPHILS 0.5 0 - 3 %   METABOLIC PANEL, COMPREHENSIVE    Collection Time: 11/18/17 11:45 AM   Result Value Ref Range    Sodium 142 136 - 145 mEq/L    Potassium 3.9 3.5 - 5.1 mEq/L    Chloride 113 (H) 98 - 107 mEq/L    CO2 23 21 - 32 mEq/L    Glucose 93 74 - 106 mg/dl    BUN 11 7 - 25 mg/dl    Creatinine 1.0 0.6 - 1.3 mg/dl    GFR est AA >60.0      GFR est non-AA >60      Calcium 8.8 8.5 - 10.1 mg/dl    AST (SGOT) 12 (L) 15 - 37 U/L    ALT (SGPT) 16 12 - 78 U/L    Alk. phosphatase 97 45 - 117 U/L    Bilirubin, total 0.2 0.2 - 1.0 mg/dl    Protein, total 7.4 6.4 - 8.2 gm/dl    Albumin 3.7 3.4 - 5.0 gm/dl    Anion gap 6 5 - 15 mmol/L   LIPASE    Collection Time: 11/18/17 11:45 AM   Result Value Ref Range    Lipase 281 73 - 393 U/L   MAGNESIUM    Collection Time: 11/18/17 11:45 AM   Result Value Ref Range    Magnesium 2.2 1.6 - 2.6 mg/dl   POC URINE MACROSCOPIC    Collection Time: 11/18/17 11:55 AM   Result Value Ref Range    Glucose Negative NEGATIVE,Negative mg/dl    Bilirubin Negative NEGATIVE,Negative      Ketone Negative NEGATIVE,Negative mg/dl    Specific gravity 1.025 1.005 - 1.030      Blood Trace-intact (A) NEGATIVE,Negative      pH (UA) 6.0 5 - 9      Protein Negative NEGATIVE,Negative mg/dl    Urobilinogen 0.2 0.0 - 1.0 EU/dl    Nitrites Negative NEGATIVE,Negative      Leukocyte Esterase Negative NEGATIVE,Negative      Color Yellow      Appearance Clear     POC HCG,URINE    Collection Time: 11/18/17 11:56 AM   Result Value Ref Range    HCG urine, QL negative NEGATIVE,Negative,negative         Radiologic Studies -   No orders to display         Medical Decision Making   I am the first provider for this patient.    I reviewed the vital signs, available nursing notes, past medical history, past surgical history, family history and social history.    Vital Signs-Reviewed the patient's vital  signs.      EKG:    Records Reviewed: Nursing Notes and Old Medical Records (Time of Review: 11:53 AM)    ED Course: Progress Notes, Reevaluation, and Consults:  Provider Notes (Medical Decision Making):   MDM  Number of Diagnoses or Management Options  Other specified hypotension:   Diagnosis management comments: Mild fatigue no evidence of hypotension in the emergency department no evidence of sepsis we will check baseline labs renal insufficiency check for anemia UA with Zofran given in the ED    12:45 PM  Labs reviewed patient stable in triage given a liter of fluid as well as Zofran for her nausea home       Amount and/or Complexity of Data Reviewed  Clinical lab tests: ordered and reviewed    Risk of Complications, Morbidity, and/or Mortality  Presenting problems: high  Diagnostic procedures: moderate  Management options: moderate            Critical Care Time:       Diagnosis     Clinical Impression:   1. Other specified hypotension        Disposition: home     Follow-up Information     Follow up With Specialties Details Why [place]    [facility] EMERGENCY DEPT Emergency Medicine  As needed, If symptoms worsen [place]    [name] Kays, MD Family Practice Schedule an appointment as soon as possible for a visit for ED Follow up appointment  [address]  [place] [address]  [phone]             Patient's Medications   Start Taking    No medications on file   Continue Taking    ALBUTEROL (PROVENTIL HFA, VENTOLIN HFA, PROAIR HFA) 90 MCG/ACTUATION INHALER    Take 2 Puffs by inhalation every six (6) hours as needed for Wheezing.    AZELASTINE (ASTEPRO) 0.15 % (205.5 MCG)    2 Sprays two (2) times a day.    BACLOFEN (LIORESAL) 10 MG TABLET    Take 10 mg by mouth three (3) times daily. Indications: as needed    BUPROPION XL (WELLBUTRIN XL) 300 MG XL TABLET    Take 300 mg by mouth every morning.    BUTALBITAL-ACETAMINOPHEN-CAFFEINE  (FIORICET, ESGIC) 50-325-40 MG PER TABLET    Take 1 Tab by mouth.    CALCIUM-CHOLECALCIFEROL, D3, (CALTRATE 600+D) TABLET    Take 1 Tab by mouth daily.    CETIRIZINE (ZYRTEC) 10 MG CAP    Take  by mouth.    DICYCLOMINE (BENTYL) 20 MG TABLET    Take 20 mg by mouth nightly.    FLUTICASONE PROPIONATE (FLONASE) 50 MCG/ACTUATION NASAL SPRAY    2 Sprays by Both Nostrils route daily.    GABAPENTIN (NEURONTIN) 600 MG TABLET    Take 300 mg by mouth two (2) times a day. Indications: 300 twice daily as needed, 800 mg at bedtime    MODAFINIL (PROVIGIL PO)    Take  by mouth.    MONTELUKAST (SINGULAIR) 10 MG TABLET    Take 10 mg by mouth daily. Indications: Takes generic    TOPIRAMATE (TOPAMAX) 100 MG TABLET    Take 200 mg by mouth nightly.    TRAZODONE (DESYREL) 100 MG TABLET    Take 100 mg by mouth nightly.   These Medications have changed    No medications on file   Stop Taking    No medications on file     _______________________________    Please note that this dictation was completed with Dragon, the computer voice recognition software.  Quite often unanticipated grammatical, syntax, homophones,  and other interpretive errors are inadvertently transcribed by the computer software.  Please disregard these errors.  Please excuse any errors that have escaped final proofreading.

## 2017-11-18 NOTE — ED Notes (Signed)
 Pt arrived to ER from home with c/o low BP over the weekend. Pt states her BP was averaging around 90 for the top number. Pt also has c/o generalized weakness, nausea, and fatigue. Pt states the sx have been intermittent for awhile. Pt did call her PCP, who recommended the pt come to ER for further eval.

## 2017-11-18 NOTE — ED Triage Notes (Signed)
Pt arrived to ER from home with c/o low BP over the weekend. Pt states her BP was "averaging around 90 for the top number". Pt also has c/o generalized weakness, nausea, and fatigue. Pt states the sx have been intermittent "for awhile". Pt did call her PCP, who recommended the pt come to ER for further eval.

## 2017-11-18 NOTE — ED Provider Notes (Addendum)
EMERGENCY DEPARTMENT HISTORY AND PHYSICAL EXAM    11:53 AM      Date: 11/18/2017  Patient Name: [name] [name]    History of Presenting Illness     Chief Complaint   Patient presents with   ??? Fatigue   ??? Nausea         History Provided By: Patient      Additional History (Context): [name] [name] is a 47 y.o. female who presents with means of having a low blood pressure all weekend states she has felt somewhat fatigued she has had some mild nausea she has been drinking salt water and coconut juice with the idea that she needed to get her blood pressure back up.  She states she has had a very mild decrease in appetite with very mild nausea she said no vomiting no diarrhea no chest pain no shortness of breath no syncope.  Patient denies any fevers or chills.  She was concerned that she has had mild renal insufficiency in the past and with her blood pressure being low she wanted to get it checked out.  Social history she denies alcohol tobacco or recreational drug use he was referred to the emergency department for evaluation this morning.    PCP: Marlowe Kays, MD      Current Outpatient Medications   Medication Sig Dispense Refill   ??? baclofen (LIORESAL) 10 mg tablet Take 10 mg by mouth three (3) times daily. Indications: as needed     ??? montelukast (SINGULAIR) 10 mg tablet Take 10 mg by mouth daily. Indications: Takes generic     ??? Cetirizine (ZYRTEC) 10 mg cap Take  by mouth.     ??? azelastine (ASTEPRO) 0.15 % (205.5 mcg) 2 Sprays two (2) times a day.     ??? fluticasone propionate (FLONASE) 50 mcg/actuation nasal spray 2 Sprays by Both Nostrils route daily.     ??? butalbital-acetaminophen-caffeine (FIORICET, ESGIC) 50-325-40 mg per tablet Take 1 Tab by mouth.     ??? calcium-cholecalciferol, D3, (CALTRATE 600+D) tablet Take 1 Tab by mouth daily.     ??? buPROPion XL (WELLBUTRIN XL) 300 mg XL tablet Take 300 mg by mouth every morning.     ??? MODAFINIL (PROVIGIL PO) Take  by mouth.      ??? gabapentin (NEURONTIN) 600 mg tablet Take 300 mg by mouth two (2) times a day. Indications: 300 twice daily as needed, 800 mg at bedtime     ??? topiramate (TOPAMAX) 100 mg tablet Take 200 mg by mouth nightly.     ??? traZODone (DESYREL) 100 mg tablet Take 100 mg by mouth nightly.     ??? dicyclomine (BENTYL) 20 mg tablet Take 20 mg by mouth nightly.     ??? albuterol (PROVENTIL HFA, VENTOLIN HFA, PROAIR HFA) 90 mcg/actuation inhaler Take 2 Puffs by inhalation every six (6) hours as needed for Wheezing.         Past History     Past Medical History:  Past Medical History:   Diagnosis Date   ??? Chronic kidney disease     stage 3   ??? Fibromyalgia    ??? Palate mass        Past Surgical History:  Past Surgical History:   Procedure Laterality Date   ??? HX APPENDECTOMY     ??? HX CESAREAN SECTION     ??? HX HERNIA REPAIR      left   ??? HX OTHER SURGICAL  jaw surgery   ??? HX OVARIAN CYST REMOVAL     ??? HX SEPTOPLASTY     ??? HX TUBAL LIGATION         Family History:  Family History   Problem Relation Age of Onset   ??? Kidney Disease Mother    ??? Depression Mother    ??? Heart Disease Father        Social History:  Social History     Tobacco Use   ??? Smoking status: Never Smoker   ??? Smokeless tobacco: Never Used   Substance Use Topics   ??? Alcohol use: No   ??? Drug use: No       Allergies:  Allergies   Allergen Reactions   ??? Augmentin [Amoxicillin-Pot Clavulanate] Diarrhea   ??? Demerol [Meperidine] Hives and Nausea and Vomiting         Review of Systems       Review of Systems   Constitutional: Positive for appetite change and fatigue. Negative for chills, diaphoresis and fever.   HENT: Negative for congestion.    Eyes: Negative for visual disturbance.   Respiratory: Negative for cough, chest tightness and shortness of breath.    Cardiovascular: Negative for chest pain.   Gastrointestinal: Positive for nausea. Negative for abdominal pain, diarrhea and vomiting.   Genitourinary: Positive for pelvic pain. Negative for decreased urine volume.         Pelvic pain since her last.  Not acute today   Musculoskeletal: Negative for back pain.   Skin: Negative for rash.   Neurological: Positive for light-headedness and headaches. Negative for dizziness, syncope and weakness.   All other systems reviewed and are negative.        Physical Exam     Visit Vitals  BP 112/64 (BP Patient Position: Sitting)   Pulse 76   Temp 97.8 ??F (36.6 ??C)   Resp 18   SpO2 100%       Physical Exam   Constitutional: She is oriented to person, place, and time. She appears well-developed and well-nourished. No distress.   Nontoxic-appearing no distress   HENT:   Head: Normocephalic and atraumatic.   Mouth/Throat: Oropharynx is clear and moist.   Eyes: Pupils are equal, round, and reactive to light. Conjunctivae and EOM are normal. No scleral icterus.   Neck: Normal range of motion. Neck supple.   Cardiovascular: Normal rate, regular rhythm and normal heart sounds.   No murmur heard.  Pulmonary/Chest: Effort normal and breath sounds normal. No respiratory distress.   Abdominal: Soft. Bowel sounds are normal. She exhibits no distension. There is no tenderness.   Musculoskeletal: She exhibits no edema.   Lymphadenopathy:     She has no cervical adenopathy.   Neurological: She is alert and oriented to person, place, and time. Coordination normal.   Skin: Skin is warm and dry. No rash noted.   Psychiatric: She has a normal mood and affect. Her behavior is normal.   Nursing note and vitals reviewed.        Diagnostic Study Results     Labs -  Recent Results (from the past 12 hour(s))   CBC WITH AUTOMATED DIFF    Collection Time: 11/18/17 11:45 AM   Result Value Ref Range    WBC 7.3 4.0 - 11.0 1000/mm3    RBC 3.82 3.60 - 5.20 M/uL    HGB 11.8 (L) 13.0 - 17.2 gm/dl    HCT 35.9 (L) 37.0 - 50.0 %  MCV 94.0 80.0 - 98.0 fL    MCH 30.9 25.4 - 34.6 pg    MCHC 32.9 30.0 - 36.0 gm/dl    PLATELET 381 140 - 450 1000/mm3    MPV 10.3 (H) 6.0 - 10.0 fL    RDW-SD 45.1 36.4 - 46.3      NRBC 0 0 - 0       IMMATURE GRANULOCYTES 0.3 0.0 - 3.0 %    NEUTROPHILS 68.0 (H) 34 - 64 %    LYMPHOCYTES 20.7 (L) 28 - 48 %    MONOCYTES 6.8 1 - 13 %    EOSINOPHILS 3.7 0 - 5 %    BASOPHILS 0.5 0 - 3 %   METABOLIC PANEL, COMPREHENSIVE    Collection Time: 11/18/17 11:45 AM   Result Value Ref Range    Sodium 142 136 - 145 mEq/L    Potassium 3.9 3.5 - 5.1 mEq/L    Chloride 113 (H) 98 - 107 mEq/L    CO2 23 21 - 32 mEq/L    Glucose 93 74 - 106 mg/dl    BUN 11 7 - 25 mg/dl    Creatinine 1.0 0.6 - 1.3 mg/dl    GFR est AA >60.0      GFR est non-AA >60      Calcium 8.8 8.5 - 10.1 mg/dl    AST (SGOT) 12 (L) 15 - 37 U/L    ALT (SGPT) 16 12 - 78 U/L    Alk. phosphatase 97 45 - 117 U/L    Bilirubin, total 0.2 0.2 - 1.0 mg/dl    Protein, total 7.4 6.4 - 8.2 gm/dl    Albumin 3.7 3.4 - 5.0 gm/dl    Anion gap 6 5 - 15 mmol/L   LIPASE    Collection Time: 11/18/17 11:45 AM   Result Value Ref Range    Lipase 281 73 - 393 U/L   MAGNESIUM    Collection Time: 11/18/17 11:45 AM   Result Value Ref Range    Magnesium 2.2 1.6 - 2.6 mg/dl   POC URINE MACROSCOPIC    Collection Time: 11/18/17 11:55 AM   Result Value Ref Range    Glucose Negative NEGATIVE,Negative mg/dl    Bilirubin Negative NEGATIVE,Negative      Ketone Negative NEGATIVE,Negative mg/dl    Specific gravity 1.025 1.005 - 1.030      Blood Trace-intact (A) NEGATIVE,Negative      pH (UA) 6.0 5 - 9      Protein Negative NEGATIVE,Negative mg/dl    Urobilinogen 0.2 0.0 - 1.0 EU/dl    Nitrites Negative NEGATIVE,Negative      Leukocyte Esterase Negative NEGATIVE,Negative      Color Yellow      Appearance Clear     POC HCG,URINE    Collection Time: 11/18/17 11:56 AM   Result Value Ref Range    HCG urine, QL negative NEGATIVE,Negative,negative         Radiologic Studies -   No orders to display         Medical Decision Making   I am the first provider for this patient.    I reviewed the vital signs, available nursing notes, past medical history, past surgical history, family history and social history.     Vital Signs-Reviewed the patient's vital signs.      EKG:    Records Reviewed: Nursing Notes and Old Medical Records (Time of Review: 11:53 AM)    ED Course: Progress Notes, Reevaluation, and Consults:  Provider Notes (Medical Decision Making):   MDM  Number of Diagnoses or Management Options  Other specified hypotension:   Diagnosis management comments: Mild fatigue no evidence of hypotension in the emergency department no evidence of sepsis we will check baseline labs renal insufficiency check for anemia UA with Zofran given in the ED    12:45 PM  Labs reviewed patient stable in triage given a liter of fluid as well as Zofran for her nausea home       Amount and/or Complexity of Data Reviewed  Clinical lab tests: ordered and reviewed    Risk of Complications, Morbidity, and/or Mortality  Presenting problems: high  Diagnostic procedures: moderate  Management options: moderate            Critical Care Time:       Diagnosis     Clinical Impression:   1. Other specified hypotension        Disposition: home     Follow-up Information     Follow up With Specialties Details Why [place]    [facility] EMERGENCY DEPT Emergency Medicine  As needed, If symptoms worsen [place]    Marlowe Kays, MD Family Practice Schedule an appointment as soon as possible for a visit for ED Follow up appointment  [address]  [place] [address]  [phone]             Patient's Medications   Start Taking    No medications on file   Continue Taking    ALBUTEROL (PROVENTIL HFA, VENTOLIN HFA, PROAIR HFA) 90 MCG/ACTUATION INHALER    Take 2 Puffs by inhalation every six (6) hours as needed for Wheezing.    AZELASTINE (ASTEPRO) 0.15 % (205.5 MCG)    2 Sprays two (2) times a day.    BACLOFEN (LIORESAL) 10 MG TABLET    Take 10 mg by mouth three (3) times daily. Indications: as needed     BUPROPION XL (WELLBUTRIN XL) 300 MG XL TABLET    Take 300 mg by mouth every morning.    BUTALBITAL-ACETAMINOPHEN-CAFFEINE (FIORICET, ESGIC) 50-325-40 MG PER TABLET    Take 1 Tab by mouth.    CALCIUM-CHOLECALCIFEROL, D3, (CALTRATE 600+D) TABLET    Take 1 Tab by mouth daily.    CETIRIZINE (ZYRTEC) 10 MG CAP    Take  by mouth.    DICYCLOMINE (BENTYL) 20 MG TABLET    Take 20 mg by mouth nightly.    FLUTICASONE PROPIONATE (FLONASE) 50 MCG/ACTUATION NASAL SPRAY    2 Sprays by Both Nostrils route daily.    GABAPENTIN (NEURONTIN) 600 MG TABLET    Take 300 mg by mouth two (2) times a day. Indications: 300 twice daily as needed, 800 mg at bedtime    MODAFINIL (PROVIGIL PO)    Take  by mouth.    MONTELUKAST (SINGULAIR) 10 MG TABLET    Take 10 mg by mouth daily. Indications: Takes generic    TOPIRAMATE (TOPAMAX) 100 MG TABLET    Take 200 mg by mouth nightly.    TRAZODONE (DESYREL) 100 MG TABLET    Take 100 mg by mouth nightly.   These Medications have changed    No medications on file   Stop Taking    No medications on file     _______________________________    Please note that this dictation was completed with Dragon, the computer voice recognition software.  Quite often unanticipated grammatical, syntax, homophones,  and other interpretive errors are inadvertently transcribed by the computer software.  Please disregard these errors.  Please excuse any errors that have escaped final proofreading.

## 2017-11-18 NOTE — ED Notes (Signed)
1:01 PM  11/18/17     Discharge instructions given to pt (name) with verbalization of understanding. Patient accompanied by .  Patient discharged with the following prescriptionsPatient discharged to home (destination).      Melrico Morido, RN

## 2017-11-20 ENCOUNTER — Encounter

## 2017-11-20 ENCOUNTER — Inpatient Hospital Stay: Payer: MEDICARE | Primary: Geriatric Medicine

## 2017-11-20 DIAGNOSIS — R110 Nausea: Secondary | ICD-10-CM

## 2017-11-22 ENCOUNTER — Encounter

## 2017-11-22 ENCOUNTER — Inpatient Hospital Stay: Admit: 2017-11-22 | Payer: MEDICARE | Primary: Geriatric Medicine

## 2017-11-22 DIAGNOSIS — Z1231 Encounter for screening mammogram for malignant neoplasm of breast: Secondary | ICD-10-CM

## 2017-12-05 ENCOUNTER — Ambulatory Visit: Payer: MEDICARE | Primary: Geriatric Medicine

## 2017-12-16 ENCOUNTER — Encounter

## 2017-12-24 ENCOUNTER — Emergency Department (HOSPITAL_COMMUNITY): Payer: Medicare PPO

## 2017-12-24 ENCOUNTER — Emergency Department (HOSPITAL_COMMUNITY)
Admission: EM | Admit: 2017-12-24 | Discharge: 2017-12-24 | Disposition: A | Discharge status: Home / Self Care | Payer: Medicare PPO | Attending: Emergency Medicine | Admitting: Emergency Medicine

## 2017-12-24 ENCOUNTER — Inpatient Hospital Stay: Admit: 2017-12-24 | Payer: MEDICARE | Attending: Geriatric Medicine | Primary: Geriatric Medicine

## 2017-12-24 DIAGNOSIS — S5001XA Contusion of right elbow, initial encounter: Secondary | ICD-10-CM

## 2017-12-24 DIAGNOSIS — Y999 Unspecified external cause status: Secondary | ICD-10-CM | POA: Insufficient documentation

## 2017-12-24 DIAGNOSIS — Y939 Activity, unspecified: Secondary | ICD-10-CM | POA: Diagnosis not present

## 2017-12-24 DIAGNOSIS — Y92410 Unspecified street and highway as the place of occurrence of the external cause: Secondary | ICD-10-CM | POA: Diagnosis not present

## 2017-12-24 DIAGNOSIS — M542 Cervicalgia: Secondary | ICD-10-CM

## 2017-12-24 DIAGNOSIS — Z136 Encounter for screening for cardiovascular disorders: Secondary | ICD-10-CM

## 2017-12-24 LAB — NM MYOCARDIAL SPECT REST EXERCISE OR RX: Left Ventricular Ejection Fraction: 73

## 2017-12-24 MED ORDER — SODIUM CHLORIDE 0.9 % IJ SYRG
Freq: Once | INTRAMUSCULAR | Status: AC
Start: 2017-12-24 — End: 2017-12-24
  Administered 2017-12-24: 16:00:00 via INTRAVENOUS

## 2017-12-24 MED ORDER — REGADENOSON 0.4 MG/5 ML IV SYRINGE
0.4 mg/5 mL | Freq: Once | INTRAVENOUS | Status: AC
Start: 2017-12-24 — End: 2017-12-24
  Administered 2017-12-24: 16:00:00 via INTRAVENOUS

## 2017-12-24 MED ORDER — TECHNETIUM TC 99M SESTAMIBI - CARDIOLITE
Freq: Once | Status: AC
Start: 2017-12-24 — End: 2017-12-24
  Administered 2017-12-24: 14:00:00 via INTRAVENOUS

## 2017-12-24 MED ORDER — TECHNETIUM TC 99M SESTAMIBI - CARDIOLITE
Freq: Once | Status: AC
Start: 2017-12-24 — End: 2017-12-24
  Administered 2017-12-24: 16:00:00 via INTRAVENOUS

## 2017-12-24 MED ORDER — OXYCODONE-ACETAMINOPHEN 5-325 MG PO TABS
1.0000 | ORAL_TABLET | Freq: Once | ORAL | Status: AC
  Administered 2017-12-24: 1 via ORAL
  Filled 2017-12-24: qty 1

## 2017-12-24 MED ORDER — ACETAMINOPHEN 325 MG PO TABS
650.0000 mg | ORAL_TABLET | Freq: Once | ORAL | Status: AC
  Administered 2017-12-24: 650 mg via ORAL
  Filled 2017-12-24: qty 2

## 2017-12-24 MED FILL — LEXISCAN 0.4 MG/5 ML INTRAVENOUS SYRINGE: 0.4 mg/5 mL | INTRAVENOUS | Qty: 5

## 2017-12-24 MED FILL — BD POSIFLUSH NORMAL SALINE 0.9 % INJECTION SYRINGE: INTRAMUSCULAR | Qty: 10

## 2017-12-24 NOTE — ED Notes (Signed)
Out to radiology
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
## 2017-12-24 NOTE — ED Notes (Signed)
Patient verbalizes understanding of discharge instructions. Opportunity for questioning and answers were provided. Armband removed by staff, pt discharged from ED.  
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
## 2018-01-08 ENCOUNTER — Encounter

## 2018-01-09 ENCOUNTER — Inpatient Hospital Stay: Admit: 2018-01-09 | Payer: MEDICARE | Primary: Geriatric Medicine

## 2018-01-09 ENCOUNTER — Encounter

## 2018-01-09 DIAGNOSIS — M79641 Pain in right hand: Secondary | ICD-10-CM

## 2018-01-15 ENCOUNTER — Inpatient Hospital Stay: Payer: MEDICARE | Attending: Geriatric Medicine | Primary: Geriatric Medicine

## 2018-04-21 ENCOUNTER — Inpatient Hospital Stay
Admit: 2018-04-21 | Discharge: 2018-04-22 | Disposition: A | Discharge status: Home / Self Care | Payer: MEDICARE | Attending: Emergency Medicine

## 2018-04-21 ENCOUNTER — Emergency Department: Admit: 2018-04-21 | Payer: MEDICARE | Primary: Geriatric Medicine

## 2018-04-21 DIAGNOSIS — N132 Hydronephrosis with renal and ureteral calculous obstruction: Secondary | ICD-10-CM

## 2018-04-21 LAB — CBC WITH AUTO DIFFERENTIAL
Basophils %: 0.4 % (ref 0–3)
Eosinophils %: 0.3 % (ref 0–5)
Hematocrit: 33.9 % — ABNORMAL LOW (ref 37.0–50.0)
Hemoglobin: 10.9 gm/dl — ABNORMAL LOW (ref 13.0–17.2)
Immature Granulocytes: 0.5 % (ref 0.0–3.0)
Lymphocytes %: 20.7 % — ABNORMAL LOW (ref 28–48)
MCH: 29.8 pg (ref 25.4–34.6)
MCHC: 32.2 gm/dl (ref 30.0–36.0)
MCV: 92.6 fL (ref 80.0–98.0)
MPV: 10.1 fL — ABNORMAL HIGH (ref 6.0–10.0)
Monocytes %: 8.8 % (ref 1–13)
Neutrophils %: 69.3 % — ABNORMAL HIGH (ref 34–64)
Nucleated RBCs: 0 (ref 0–0)
Platelets: 350 10*3/uL (ref 140–450)
RBC: 3.66 M/uL (ref 3.60–5.20)
RDW-SD: 44.7 (ref 36.4–46.3)
WBC: 10.5 10*3/uL (ref 4.0–11.0)

## 2018-04-21 LAB — COMPREHENSIVE METABOLIC PANEL
ALT: 22 U/L (ref 12–78)
AST: 13 U/L — ABNORMAL LOW (ref 15–37)
Albumin: 3.7 gm/dl (ref 3.4–5.0)
Alkaline Phosphatase: 83 U/L (ref 45–117)
Anion Gap: 6 mmol/L (ref 5–15)
BUN: 18 mg/dl (ref 7–25)
CO2: 26 mEq/L (ref 21–32)
Calcium: 8.9 mg/dl (ref 8.5–10.1)
Chloride: 110 mEq/L — ABNORMAL HIGH (ref 98–107)
Creatinine: 2 mg/dl — ABNORMAL HIGH (ref 0.6–1.3)
EGFR IF NonAfrican American: 28
GFR African American: 34
Glucose: 88 mg/dl (ref 74–106)
Potassium: 3.1 mEq/L — ABNORMAL LOW (ref 3.5–5.1)
Sodium: 141 mEq/L (ref 136–145)
Total Bilirubin: 0.5 mg/dl (ref 0.2–1.0)
Total Protein: 7.5 gm/dl (ref 6.4–8.2)

## 2018-04-21 LAB — POC HCG,URINE
HCG urine, QL: NEGATIVE
Pregnancy Test(Urn): NEGATIVE

## 2018-04-21 LAB — POC URINE MACROSCOPIC
Bilirubin, Urine: NEGATIVE
Bilirubin: NEGATIVE
Glucose, Ur: NEGATIVE mg/dl
Glucose: NEGATIVE mg/dl
Ketone: NEGATIVE mg/dl
Ketones, Urine: NEGATIVE mg/dl
Nitrite, Urine: NEGATIVE
Nitrites: NEGATIVE
Protein, UA: NEGATIVE mg/dl
Protein: NEGATIVE mg/dl
Specific Gravity, UA: 1.02 (ref 1.005–1.030)
Specific gravity: 1.02 (ref 1.005–1.030)
Urobilinogen, UA, POCT: 0.2 EU/dl (ref 0.0–1.0)
Urobilinogen: 0.2 EU/dl (ref 0.0–1.0)
pH (UA): 7 (ref 5–9)
pH, UA: 7 (ref 5–9)

## 2018-04-21 LAB — POC URINE MICROSCOPIC

## 2018-04-21 LAB — LIPASE
Lipase: 164 U/L (ref 73–393)
Lipase: 164 U/L (ref 73–393)

## 2018-04-21 LAB — METABOLIC PANEL, COMPREHENSIVE
ALT (SGPT): 22 U/L (ref 12–78)
AST (SGOT): 13 U/L — ABNORMAL LOW (ref 15–37)
Albumin: 3.7 gm/dl (ref 3.4–5.0)
Alk. phosphatase: 83 U/L (ref 45–117)
Anion gap: 6 mmol/L (ref 5–15)
BUN: 18 mg/dl (ref 7–25)
Bilirubin, total: 0.5 mg/dl (ref 0.2–1.0)
CO2: 26 mEq/L (ref 21–32)
Calcium: 8.9 mg/dl (ref 8.5–10.1)
Chloride: 110 mEq/L — ABNORMAL HIGH (ref 98–107)
Creatinine: 2 mg/dl — ABNORMAL HIGH (ref 0.6–1.3)
GFR est AA: 34
GFR est non-AA: 28
Glucose: 88 mg/dl (ref 74–106)
Potassium: 3.1 mEq/L — ABNORMAL LOW (ref 3.5–5.1)
Protein, total: 7.5 gm/dl (ref 6.4–8.2)
Sodium: 141 mEq/L (ref 136–145)

## 2018-04-21 LAB — CBC WITH AUTOMATED DIFF
BASOPHILS: 0.4 % (ref 0–3)
EOSINOPHILS: 0.3 % (ref 0–5)
HCT: 33.9 % — ABNORMAL LOW (ref 37.0–50.0)
HGB: 10.9 gm/dl — ABNORMAL LOW (ref 13.0–17.2)
IMMATURE GRANULOCYTES: 0.5 % (ref 0.0–3.0)
LYMPHOCYTES: 20.7 % — ABNORMAL LOW (ref 28–48)
MCH: 29.8 pg (ref 25.4–34.6)
MCHC: 32.2 gm/dl (ref 30.0–36.0)
MCV: 92.6 fL (ref 80.0–98.0)
MONOCYTES: 8.8 % (ref 1–13)
MPV: 10.1 fL — ABNORMAL HIGH (ref 6.0–10.0)
NEUTROPHILS: 69.3 % — ABNORMAL HIGH (ref 34–64)
NRBC: 0 (ref 0–0)
PLATELET: 350 10*3/uL (ref 140–450)
RBC: 3.66 M/uL (ref 3.60–5.20)
RDW-SD: 44.7 (ref 36.4–46.3)
WBC: 10.5 10*3/uL (ref 4.0–11.0)

## 2018-04-21 MED ORDER — SODIUM CHLORIDE 0.9 % IJ SYRG
Freq: Once | INTRAMUSCULAR | Status: AC
Start: 2018-04-21 — End: 2018-04-21
  Administered 2018-04-21: 22:00:00 via INTRAVENOUS

## 2018-04-21 MED ORDER — ONDANSETRON (PF) 4 MG/2 ML INJECTION
4 mg/2 mL | Freq: Once | INTRAMUSCULAR | Status: AC
Start: 2018-04-21 — End: 2018-04-21
  Administered 2018-04-21: 22:00:00 via INTRAVENOUS

## 2018-04-21 MED ORDER — SODIUM CHLORIDE 0.9% BOLUS IV
0.9 % | INTRAVENOUS | Status: AC
Start: 2018-04-21 — End: 2018-04-21
  Administered 2018-04-21: 22:00:00 via INTRAVENOUS

## 2018-04-21 MED ORDER — MORPHINE 4 MG/ML SYRINGE
4 mg/mL | INTRAMUSCULAR | Status: AC
Start: 2018-04-21 — End: 2018-04-21
  Administered 2018-04-21: 22:00:00 via INTRAVENOUS

## 2018-04-21 MED FILL — MORPHINE 4 MG/ML SYRINGE: 4 mg/mL | INTRAMUSCULAR | Qty: 1

## 2018-04-21 MED FILL — ONDANSETRON (PF) 4 MG/2 ML INJECTION: 4 mg/2 mL | INTRAMUSCULAR | Qty: 2

## 2018-04-21 NOTE — ED Notes (Signed)
 TRANSFER - OUT REPORT:    Verbal report given to Amy, RN(name) on [name] [name]  being transferred to 4217(unit) for routine progression of care       Report consisted of patient's Situation, Background, Assessment and   Recommendations(SBAR).     Information from the following report(s) SBAR, ED Summary and MAR was reviewed with the receiving nurse.    Lines:   Peripheral IV 04/21/18 Left Antecubital (Active)        Opportunity for questions and clarification was provided.      Patient transported with:   [employer]

## 2018-04-21 NOTE — ED Notes (Signed)
Patient states she called her PCP office who told her to come in to ED for pain in right lower back radiating to right flank and right lower abdomen, began last night, states she continues to have mild nausea currently but last night was vomiting, denies fever/chills/diarrhea, states headache.

## 2018-04-21 NOTE — H&P (Signed)
Medicine History and Physical    Patient: [name] [name] Age: 48 y.o. Sex: female    Date of Birth: 1970/03/27 Admit Date: 04/21/2018 PCP: Marlowe Kays, MD   MRN: [id number]  CSN: [id number]         Assessment   Ureterolithiasis, 2 mm obstructing stone right UVJ, moderate hydronephrosis  Nausea and Vomiting  Abdominal pain   Hypokalemia   Acute renal insufficiency, Cr 2.0, recent baseline normal but patient states to have had CKD      Plan   IVF   Trend Cr, Avoid nephrotoxins   Follow urine culture  Trend WBC and fever curve  Urology consulted   Zofran PRN  Replacing K  Flomax  Strain urine  Diet NPO  DVT PPX ambulatory   ACP: CODE STATUS FULL CODE             Chief Complaint:  Chief Complaint   Patient presents with   ??? Back Pain   ??? Flank Pain         HPI:   [name] [name] is a 48 y.o. year old female who presents with    right-sided flank pain and right lower quadrant abdominal pain scribed as sharp that is been intermittent since onset yesterday evening, has had nausea and vomiting. Patient denies fever. No diarrhea. CT shows 2 mm obstructing stone right UVJ, moderate hydronephrosis and Nonobstructing left nephrolithiasis, largest stone measures 5 mm. In Ed, Urology consulted.       Review of Systems - 12 Point ROS -ve except what is noted in the HPI.     Past Medical History:  Past Medical History:   Diagnosis Date   ??? Chronic kidney disease     stage 3   ??? Fibromyalgia    ??? Palate mass        Past Surgical History:  Past Surgical History:   Procedure Laterality Date   ??? HX APPENDECTOMY     ??? HX CESAREAN SECTION     ??? HX HERNIA REPAIR      left   ??? HX OTHER SURGICAL      jaw surgery   ??? HX OVARIAN CYST REMOVAL     ??? HX SEPTOPLASTY     ??? HX TUBAL LIGATION         Family History:  Family History   Problem Relation Age of Onset   ??? Kidney Disease Mother    ??? Depression Mother    ??? Heart Disease Father        Social History:  Social History     Socioeconomic History   ??? Marital status: MARRIED     Spouse name: Not  on file   ??? Number of children: Not on file   ??? Years of education: Not on file   ??? Highest education level: Not on file   Tobacco Use   ??? Smoking status: Never Smoker   ??? Smokeless tobacco: Never Used   Substance and Sexual Activity   ??? Alcohol use: No   ??? Drug use: No       Home Medications:  Prior to Admission medications    Medication Sig Start Date End Date Taking? Authorizing Provider   baclofen (LIORESAL) 10 mg tablet Take 10 mg by mouth three (3) times daily. Indications: as needed   Yes Provider, Historical   montelukast (SINGULAIR) 10 mg tablet Take 10 mg by mouth daily. Indications: Takes generic   Yes Provider, Historical   fluticasone  propionate (FLONASE) 50 mcg/actuation nasal spray 2 Sprays by Both Nostrils route daily.   Yes Provider, Historical   butalbital-acetaminophen-caffeine (FIORICET, ESGIC) 50-325-40 mg per tablet Take 1 Tab by mouth.   Yes Provider, Historical   calcium-cholecalciferol, D3, (CALTRATE 600+D) tablet Take 1 Tab by mouth daily.   Yes Provider, Historical   buPROPion XL (WELLBUTRIN XL) 300 mg XL tablet Take 300 mg by mouth every morning.   Yes Other, Phys, MD   MODAFINIL (PROVIGIL PO) Take  by mouth.   Yes Other, Phys, MD   gabapentin (NEURONTIN) 600 mg tablet Take 300 mg by mouth two (2) times a day. Indications: 300 twice daily as needed, 800 mg at bedtime   Yes Provider, Historical   topiramate (TOPAMAX) 100 mg tablet Take 200 mg by mouth nightly.   Yes Provider, Historical   traZODone (DESYREL) 100 mg tablet Take 100 mg by mouth nightly.   Yes Provider, Historical   dicyclomine (BENTYL) 20 mg tablet Take 20 mg by mouth nightly.   Yes Provider, Historical   albuterol (PROVENTIL HFA, VENTOLIN HFA, PROAIR HFA) 90 mcg/actuation inhaler Take 2 Puffs by inhalation every six (6) hours as needed for Wheezing.   Yes Provider, Historical   Cetirizine (ZYRTEC) 10 mg cap Take  by mouth.    Provider, Historical   azelastine (ASTEPRO) 0.15 % (205.5 mcg) 2 Sprays two (2) times a day.     Provider, Historical       Allergies:  Allergies   Allergen Reactions   ??? Augmentin [Amoxicillin-Pot Clavulanate] Diarrhea   ??? Demerol [Meperidine] Hives and Nausea and Vomiting         Physical Exam:     Visit Vitals  BP 110/62   Pulse 90   Temp 98.9 ??F (37.2 ??C)   Resp 16   Ht 4' 11.5" (1.511 m)   Wt 49.9 kg (110 lb)   SpO2 99%   BMI 21.85 kg/m??       Physical Exam:  General appearance: alert, cooperative, no distress, appears stated age  Head: Normocephalic, without obvious abnormality, atraumatic  Neck: supple, trachea midline  Lungs: clear to auscultation bilaterally  Heart: regular rate and rhythm, S1, S2 normal, no murmur, click, rub or gallop  Abdomen: soft, non-tender. Bowel sounds normal. No masses,  no organomegaly  Extremities: extremities normal, atraumatic, no cyanosis or edema  Skin: dry skin, dry MM  Neurologic: Grossly normal        Intake and Output:  Current Shift:  03/17 1901 - 03/18 0700  In: 1000 [I.V.:1000]  Out: -   Last three shifts:  No intake/output data recorded.    Lab/Data Reviewed:  Lab:   Recent Results (from the past 12 hour(s))   CBC WITH AUTOMATED DIFF    Collection Time: 04/21/18  5:45 PM   Result Value Ref Range    WBC 10.5 4.0 - 11.0 1000/mm3    RBC 3.66 3.60 - 5.20 M/uL    HGB 10.9 (L) 13.0 - 17.2 gm/dl    HCT 33.9 (L) 37.0 - 50.0 %    MCV 92.6 80.0 - 98.0 fL    MCH 29.8 25.4 - 34.6 pg    MCHC 32.2 30.0 - 36.0 gm/dl    PLATELET 350 140 - 450 1000/mm3    MPV 10.1 (H) 6.0 - 10.0 fL    RDW-SD 44.7 36.4 - 46.3      NRBC 0 0 - 0      IMMATURE GRANULOCYTES 0.5 0.0 -  3.0 %    NEUTROPHILS 69.3 (H) 34 - 64 %    LYMPHOCYTES 20.7 (L) 28 - 48 %    MONOCYTES 8.8 1 - 13 %    EOSINOPHILS 0.3 0 - 5 %    BASOPHILS 0.4 0 - 3 %   METABOLIC PANEL, COMPREHENSIVE    Collection Time: 04/21/18  5:45 PM   Result Value Ref Range    Sodium 141 136 - 145 mEq/L    Potassium 3.1 (L) 3.5 - 5.1 mEq/L    Chloride 110 (H) 98 - 107 mEq/L    CO2 26 21 - 32 mEq/L    Glucose 88 74 - 106 mg/dl    BUN 18 7 - 25 mg/dl     Creatinine 2.0 (H) 0.6 - 1.3 mg/dl    GFR est AA 34.0      GFR est non-AA 28      Calcium 8.9 8.5 - 10.1 mg/dl    AST (SGOT) 13 (L) 15 - 37 U/L    ALT (SGPT) 22 12 - 78 U/L    Alk. phosphatase 83 45 - 117 U/L    Bilirubin, total 0.5 0.2 - 1.0 mg/dl    Protein, total 7.5 6.4 - 8.2 gm/dl    Albumin 3.7 3.4 - 5.0 gm/dl    Anion gap 6 5 - 15 mmol/L   LIPASE    Collection Time: 04/21/18  5:45 PM   Result Value Ref Range    Lipase 164 73 - 393 U/L   POC HCG,URINE    Collection Time: 04/21/18  5:47 PM   Result Value Ref Range    HCG urine, QL negative NEGATIVE,Negative,negative     POC URINE MACROSCOPIC    Collection Time: 04/21/18  5:48 PM   Result Value Ref Range    Glucose Negative NEGATIVE,Negative mg/dl    Bilirubin Negative NEGATIVE,Negative      Ketone Negative NEGATIVE,Negative mg/dl    Specific gravity 1.020 1.005 - 1.030      Blood Trace-intact (A) NEGATIVE,Negative      pH (UA) 7.0 5 - 9      Protein Negative NEGATIVE,Negative mg/dl    Urobilinogen 0.2 0.0 - 1.0 EU/dl    Nitrites Negative NEGATIVE,Negative      Leukocyte Esterase Small (A) NEGATIVE,Negative      Color Yellow      Appearance Clear     POC URINE MICROSCOPIC    Collection Time: 04/21/18  5:48 PM   Result Value Ref Range    Epithelial cells, squamous 10-14 /LPF    WBC OCCASIONAL /HPF    RBC OCCASIONAL /HPF    Bacteria OCCASIONAL /HPF       Imaging:    Ct Abd Pelv Wo Cont    Result Date: 04/21/2018  DICOM format image data is available to non-affiliated external healthcare facilities or entities on a secure, media free, reciprocally searchable basis with patient authorization for 12 months following the date of the study. Clinical history: Right-sided flank and abdominal pain EXAMINATION: Noncontrast CT scan abdomen and pelvis 04/21/2018. 5 mm spiral scanning is performed from the costophrenic angles to the symphysis pubis. Coronal and sagittal reconstruction imaging has been obtained. Correlation: 02/17/2016 FINDINGS: Lung bases are clear. Mild  degenerative changes of the spine liver, gallbladder, spleen, pancreas and adrenal glands are unremarkable. 2 mm obstructing stone right UVJ, moderate right hydronephrosis and perirenal/periureteric fat stranding. Nonobstructing left nephrolithiasis, largest stone measures 5 mm. Bladder is unremarkable. Intrauterine contraceptive device is in  satisfactory position. Bilateral tubal ligation clips. Appendix is not seen. Colon and terminal ileum are unremarkable. Stomach and small bowel bowel loops are within normal limits. Abdominal aorta is unremarkable. No dominant lymph node enlargement. Small amounts of free fluid.     IMPRESSION: 1. 2 mm obstructing stone right UVJ, moderate hydronephrosis. 2. Nonobstructing left nephrolithiasis, largest stone measures 5 mm. 3. Appendectomy and bilateral tubal ligation; IUD in satisfactory position.       Rosalene Billings, MD  April 21, 2018

## 2018-04-21 NOTE — ED Provider Notes (Signed)
ED Provider Notes by Kriste Basque, [place] at 04/21/18 1726                Author: Kriste Basque, [place]  Service: Emergency Medicine  Author Type: Physician Assistant       Filed: 04/22/18 0220  Date of Service: 04/21/18 1726  Status: Attested           Editor: Kriste Basque, [place] (Physician Assistant)  Cosigner: Maylon Cos, MD at 04/22/18 [phone]          Attestation signed by Maylon Cos, MD at 04/22/18 [phone]          Continued by Dr. Hermina Barters dictation software was used for portions of this report. Unintended errors in transcription may occur.      I interviewed and examined the patient. I discussed with the mid-level provider agree with their evaluation and plan as documented here.      Pleasant female lungs are clear has some tenderness right CVA and right lower quadrant.  Good strength and sensation both legs.  Labs were reviewed and patient had a CT scan showing an obstructive stone at the right UVJ   With moderate hydronephrosis, creatinine of 2 with previous creatinines in epic were normal.      Patient was discussed with urology Dr. Consuella Lose who did recommend overnight admission to hydrate and recheck creatinine, we reviewed the patient's initial urine and he recommended to get a straight cath sample and if it was negative for pyuria no antibiotics  needed, repeat urine was negative   Patient was discussed Dr. Dawson Bills who will admit the patient to his service                                    [place]   Emergency Department Treatment Report          Patient: [name] [name]  Age: 48 y.o.  Sex: female          Date of Birth: [birth year]  Admit Date: 04/21/2018  PCP: Marlowe Kays, MD     MRN: [id number]   CSN: [id number]   APC: Kriste Basque PA-C         Room: 4217/4217  Time Dictated: 5:26 PM  Attending MD: Maylon Cos, MD           Chief Complaint      Chief Complaint       Patient presents with        ?  Back Pain        ?  Flank Pain               History of Present Illness     48 y.o. female  with history of an appendectomy, right oophorectomy, tubal ligation, C-section, CKD presents due to right-sided flank pain and right lower quadrant abdominal pain scribed as sharp that is been intermittent since onset yesterday evening.  Pain is worsened  with movement.  She reports associated nausea and vomiting.  States she vomited 7 times last night, none since.  She has been able to tolerate p.o. today.  She denies fever, chills, chest pain, shortness of breath, dysuria, hematuria, abnormal vaginal  bleeding or discharge, diarrhea, constipation, recent falls or trauma or any other symptoms at this time.        Review of Systems  Review of Systems    Constitutional: Negative for chills and fever.    HENT: Negative for congestion and sore throat.     Eyes: Negative for blurred vision and double vision.    Respiratory: Negative for cough and shortness of breath.     Cardiovascular: Negative for chest pain.    Gastrointestinal: Positive for abdominal pain, nausea  and vomiting. Negative for diarrhea.    Genitourinary: Positive for flank pain. Negative for dysuria.    Musculoskeletal: Negative for falls.    Skin: Negative for rash.    Neurological: Negative for loss of consciousness and headaches.            Past Medical/Surgical History          Past Medical History:        Diagnosis  Date         ?  Chronic kidney disease            stage 3         ?  Fibromyalgia           ?  Palate mass            Past Surgical History:         Procedure  Laterality  Date          ?  HX APPENDECTOMY         ?  HX CESAREAN SECTION         ?  HX HERNIA REPAIR              left          ?  HX OTHER SURGICAL              jaw surgery          ?  HX OVARIAN CYST REMOVAL         ?  HX SEPTOPLASTY              ?  HX TUBAL LIGATION                 Social History          Social History          Socioeconomic History         ?  Marital status:  MARRIED              Spouse name:  Not on  file         ?  Number of children:  Not on file     ?  Years of education:  Not on file     ?  Highest education level:  Not on file       Tobacco Use         ?  Smoking status:  Never Smoker     ?  Smokeless tobacco:  Never Used       Substance and Sexual Activity         ?  Alcohol use:  No         ?  Drug use:  No             Family History          Family History         Problem  Relation  Age of Onset          ?  Kidney Disease  Mother       ?  Depression  Mother            ?  Heart Disease  Father               Current Medications          Prior to Admission Medications     Prescriptions  Last Dose  Informant  Patient Reported?  Taking?      Cetirizine (ZYRTEC) 10 mg cap  Not Taking at Unknown time    Yes  No      Sig: Take  by mouth.      MODAFINIL (PROVIGIL PO)  04/14/2018 at Unknown time    Yes  Yes      Sig: Take  by mouth.      albuterol (PROVENTIL HFA, VENTOLIN HFA, PROAIR HFA) 90 mcg/actuation inhaler  04/14/2018 at Unknown time    Yes  Yes      Sig: Take 2 Puffs by inhalation every six (6) hours as needed for Wheezing.      azelastine (ASTEPRO) 0.15 % (205.5 mcg)  Not Taking at Unknown time    Yes  No      Sig: 2 Sprays two (2) times a day.      baclofen (LIORESAL) 10 mg tablet  04/20/2018 at Unknown time    Yes  Yes      Sig: Take 10 mg by mouth three (3) times daily. Indications: as needed      buPROPion XL (WELLBUTRIN XL) 300 mg XL tablet  04/20/2018 at Unknown time    Yes  Yes      Sig: Take 300 mg by mouth every morning.      butalbital-acetaminophen-caffeine (FIORICET, ESGIC) 50-325-40 mg per tablet  04/14/2018 at Unknown time    Yes  Yes      Sig: Take 1 Tab by mouth.      calcium-cholecalciferol, D3, (CALTRATE 600+D) tablet  04/20/2018 at Unknown time    Yes  Yes      Sig: Take 1 Tab by mouth daily.      dicyclomine (BENTYL) 20 mg tablet  04/20/2018 at Unknown time    Yes  Yes      Sig: Take 20 mg by mouth nightly.      fluticasone propionate (FLONASE) 50 mcg/actuation nasal spray  04/20/2018 at  Unknown time    Yes  Yes      Sig: 2 Sprays by Both Nostrils route daily.      gabapentin (NEURONTIN) 600 mg tablet  04/21/2018 at Unknown time    Yes  Yes      Sig: Take 300 mg by mouth two (2) times a day. Indications: 300 twice daily as needed, 800 mg at bedtime      montelukast (SINGULAIR) 10 mg tablet  04/14/2018 at Unknown time    Yes  Yes      Sig: Take 10 mg by mouth daily. Indications: Takes generic      topiramate (TOPAMAX) 100 mg tablet  04/14/2018 at Unknown time    Yes  Yes      Sig: Take 200 mg by mouth nightly.      traZODone (DESYREL) 100 mg tablet  04/20/2018 at Unknown time    Yes  Yes      Sig: Take 100 mg by mouth nightly.               Facility-Administered Medications: None             Allergies  Allergies        Allergen  Reactions         ?  Augmentin [Amoxicillin-Pot Clavulanate]  Diarrhea         ?  Demerol [Meperidine]  Hives and Nausea and Vomiting             Physical Exam          ED Triage Vitals [04/21/18 1638]     ED Encounter Vitals Group           BP  113/59        Pulse (Heart Rate)  86        Resp Rate  16        Temp  98.6 ??F (37 ??C)        Temp src          O2 Sat (%)  100 %        Weight  110 lb           Height  4' 11.5"        Physical Exam   Vitals signs and nursing note reviewed.   Constitutional:        Appearance: Normal appearance.   HENT :       Head: Normocephalic and atraumatic.   Eyes :       General:         Right eye: No discharge.         Left eye: No discharge.    Neck:       Musculoskeletal: Normal range of motion and neck supple.   Cardiovascular :       Rate and Rhythm: Normal rate and regular rhythm.      Pulses:            Radial pulses are 2+ on the right side and 2+  on the left side.      Heart sounds: Normal heart sounds.    Pulmonary:       Effort: No tachypnea, accessory muscle usage or respiratory distress.      Breath sounds: No stridor. No decreased breath sounds, wheezing, rhonchi or rales.   Abdominal :      Palpations: Abdomen is soft.       Tenderness: There is abdominal tenderness in the  right lower quadrant and epigastric area. There is  right CVA tenderness. There is no guarding or rebound.     Musculoskeletal: Normal range of motion.    Skin:      General: Skin is warm and dry.      Findings: No rash.    Neurological:       Mental Status: She is alert and oriented to person, place, and time.    Psychiatric:         Mood and Affect: Affect normal.               Impression and Management Plan     Ddx: Pancreatitis, diverticulitis, colitis, nephrolithiasis, obstructive uropathy, gastritis, gastroenteritis, electrolyte abnormality      Plan: Will obtain basic labs, lipase, UA and CT scan abdomen pelvis without IV contrast.     Diagnostic Studies     Lab:      Recent Results (from the past 12 hour(s))     CBC WITH AUTOMATED DIFF          Collection Time: 04/21/18  5:45 PM         Result  Value  Ref Range  WBC  10.5  4.0 - 11.0 1000/mm3       RBC  3.66  3.60 - 5.20 M/uL       HGB  10.9 (L)  13.0 - 17.2 gm/dl       HCT  33.9 (L)  37.0 - 50.0 %       MCV  92.6  80.0 - 98.0 fL       MCH  29.8  25.4 - 34.6 pg       MCHC  32.2  30.0 - 36.0 gm/dl       PLATELET  350  140 - 450 1000/mm3       MPV  10.1 (H)  6.0 - 10.0 fL       RDW-SD  44.7  36.4 - 46.3         NRBC  0  0 - 0         IMMATURE GRANULOCYTES  0.5  0.0 - 3.0 %       NEUTROPHILS  69.3 (H)  34 - 64 %       LYMPHOCYTES  20.7 (L)  28 - 48 %       MONOCYTES  8.8  1 - 13 %       EOSINOPHILS  0.3  0 - 5 %       BASOPHILS  0.4  0 - 3 %       METABOLIC PANEL, COMPREHENSIVE          Collection Time: 04/21/18  5:45 PM         Result  Value  Ref Range            Sodium  141  136 - 145 mEq/L       Potassium  3.1 (L)  3.5 - 5.1 mEq/L       Chloride  110 (H)  98 - 107 mEq/L       CO2  26  21 - 32 mEq/L       Glucose  88  74 - 106 mg/dl       BUN  18  7 - 25 mg/dl       Creatinine  2.0 (H)  0.6 - 1.3 mg/dl       GFR est AA  34.0          GFR est non-AA  28          Calcium  8.9  8.5 - 10.1 mg/dl        AST (SGOT)  13 (L)  15 - 37 U/L       ALT (SGPT)  22  12 - 78 U/L       Alk. phosphatase  83  45 - 117 U/L       Bilirubin, total  0.5  0.2 - 1.0 mg/dl       Protein, total  7.5  6.4 - 8.2 gm/dl       Albumin  3.7  3.4 - 5.0 gm/dl       Anion gap  6  5 - 15 mmol/L       LIPASE          Collection Time: 04/21/18  5:45 PM         Result  Value  Ref Range            Lipase  164  73 - 393 U/L       POC HCG,URINE  Collection Time: 04/21/18  5:47 PM         Result  Value  Ref Range            HCG urine, QL  negative  NEGATIVE,Negative,negative         POC URINE MACROSCOPIC          Collection Time: 04/21/18  5:48 PM         Result  Value  Ref Range            Glucose  Negative  NEGATIVE,Negative mg/dl       Bilirubin  Negative  NEGATIVE,Negative         Ketone  Negative  NEGATIVE,Negative mg/dl       Specific gravity  1.020  1.005 - 1.030         Blood  Trace-intact (A)  NEGATIVE,Negative         pH (UA)  7.0  5 - 9         Protein  Negative  NEGATIVE,Negative mg/dl       Urobilinogen  0.2  0.0 - 1.0 EU/dl       Nitrites  Negative  NEGATIVE,Negative         Leukocyte Esterase  Small (A)  NEGATIVE,Negative         Color  Yellow          Appearance  Clear          POC URINE MICROSCOPIC          Collection Time: 04/21/18  5:48 PM         Result  Value  Ref Range            Epithelial cells, squamous  10-14  /LPF       WBC  OCCASIONAL  /HPF       RBC  OCCASIONAL  /HPF       Bacteria  OCCASIONAL  /HPF       POC URINE MACROSCOPIC          Collection Time: 04/21/18  8:56 PM         Result  Value  Ref Range            Glucose  Negative  NEGATIVE,Negative mg/dl            Bilirubin  Negative  NEGATIVE,Negative         Ketone  Negative  NEGATIVE,Negative mg/dl       Specific gravity  1.015  1.005 - 1.030         Blood  Moderate (A)  NEGATIVE,Negative         pH (UA)  6.0  5 - 9         Protein  Negative  NEGATIVE,Negative mg/dl       Urobilinogen  0.2  0.0 - 1.0 EU/dl       Nitrites  Negative  NEGATIVE,Negative          Leukocyte Esterase  Negative  NEGATIVE,Negative         Color  Yellow               Appearance  Clear              Imaging:     Ct Abd Pelv Wo Cont      Result Date: 04/21/2018   DICOM format image data is available to non-affiliated external healthcare facilities or entities on a secure, media free, reciprocally searchable basis with patient  authorization for 12 months following the date of the study. Clinical history: Right-sided  flank and abdominal pain EXAMINATION: Noncontrast CT scan abdomen and pelvis 04/21/2018. 5 mm spiral scanning is performed from the costophrenic angles to the symphysis pubis. Coronal and sagittal reconstruction imaging has been obtained. Correlation:  02/17/2016 FINDINGS: Lung bases are clear. Mild degenerative changes of the spine liver, gallbladder, spleen, pancreas and adrenal glands are unremarkable. 2 mm obstructing stone right UVJ, moderate right hydronephrosis and perirenal/periureteric fat stranding.  Nonobstructing left nephrolithiasis, largest stone measures 5 mm. Bladder is unremarkable. Intrauterine contraceptive device is in satisfactory position. Bilateral tubal ligation clips. Appendix is not seen. Colon and terminal ileum are unremarkable.  Stomach and small bowel bowel loops are within normal limits. Abdominal aorta is unremarkable. No dominant lymph node enlargement. Small amounts of free fluid.       IMPRESSION: 1. 2 mm obstructing stone right UVJ, moderate hydronephrosis. 2. Nonobstructing left nephrolithiasis, largest stone measures 5 mm. 3. Appendectomy and bilateral tubal ligation; IUD in satisfactory position.            ED Course        Patient Vitals for the past 12 hrs:            Temp  Pulse  Resp  BP  SpO2            04/21/18 2328  98.6 ??F (37 ??C)  80  20  96/51  100 %            04/21/18 2242  100.2 ??F (37.9 ??C)  85  20  108/60  99 %     04/21/18 2206  --  89  --  109/59  100 %     04/21/18 1936  98.9 ??F (37.2 ??C)  90  16  110/62  99 %            04/21/18 1638   98.6 ??F (37 ??C)  86  16  113/59  100 %          Medications       tamsulosin (FLOMAX) capsule 0.4 mg (has no administration in time range)     baclofen (LIORESAL) tablet 10 mg (10 mg Oral Given 04/21/18 2326)     buPROPion XL (WELLBUTRIN XL) tablet 300 mg (has no administration in time range)     fluticasone propionate (FLONASE) 50 mcg/actuation nasal spray 2 Spray (has no administration in time range)     dicyclomine (BENTYL) capsule 20 mg (20 mg Oral Given 04/21/18 2326)     montelukast (SINGULAIR) tablet 10 mg (has no administration in time range)     topiramate (TOPAMAX) tablet 200 mg (200 mg Oral Given 04/21/18 2326)     sodium chloride (NS) flush 5-10 mL ( IntraVENous Canceled Entry 04/22/18 0600)     sodium chloride (NS) flush 5-10 mL (has no administration in time range)     naloxone (NARCAN) injection 0.1 mg (has no administration in time range)     acetaminophen (TYLENOL) tablet 650 mg (650 mg Oral Given 04/21/18 2202)     0.9% sodium chloride infusion (100 mL/hr IntraVENous New Bag 04/21/18 2239)     ondansetron (ZOFRAN) injection 4 mg (has no administration in time range)     morphine injection 2 mg (has no administration in time range)     gabapentin (NEURONTIN) capsule 300 mg (has no administration in time range)     gabapentin (NEURONTIN) capsule 800 mg (800 mg Oral Given  04/21/18 2346)     sodium chloride (NS) flush 5-10 mL (10 mL IntraVENous Given 04/21/18 1747)     sodium chloride 0.9 % bolus infusion 1,000 mL (0 mL IntraVENous IV Completed 04/21/18 2032)     ondansetron (ZOFRAN) injection 4 mg (4 mg IntraVENous Given 04/21/18 1746)     morphine injection 4 mg (4 mg IntraVENous Given 04/21/18 1746)       tamsulosin (FLOMAX) capsule 0.4 mg (0.4 mg Oral Given 04/21/18 2038)              Medical Decision Making        48 yo female with history of an appendectomy, right oophorectomy, tubal ligation, C-section, CKD presents due to right-sided flank pain and right lower quadrant abdominal pain scribed as sharp that  is been intermittent since onset yesterday evening.   Pain is worsened with movement.  She reports associated nausea and vomiting.  States she vomited 7 times last night, none since.  She has been able to tolerate p.o. today.  She denies fever, chills, chest pain, shortness of breath, dysuria, hematuria,  abnormal vaginal bleeding or discharge, diarrhea, constipation, recent falls or trauma or any other symptoms at this time.   Physical exam as above.  Basic labs revealed AKI.  UA appears contaminated therefore we got a catheter sample which did not appear infected.  CT showed 2 mm obstructive stone in the right UVJ.  Dr. Jess Barters discussed the case with urology who recommended  admission to medicine and they would evaluate in the morning given AKI in the setting of obstructive uropathy.  Will admit for further evaluation and management.     Final Diagnosis                 ICD-10-CM  ICD-9-CM          1.  Obstructive uropathy  N13.9  599.60          2.  AKI (acute kidney injury) ([place])  N17.9  584.9             Disposition     Admit.     Current Discharge Medication List                  Kriste Basque PA-C   April 22, 2018   5:26 PM         The patient was personally evaluated by myself. Discussed with and or seen by Dr. Emeterio Reeve, Jeanine Luz, MD who agrees with the above assessment and plan.      *Portions of this electronic record were dictated using Dragon voice recognition software.  Unintended errors in translation may occur.      My signature above authenticates this document and my orders, the final     diagnosis (es), discharge prescription (s), and instructions in the Epic     record.   If you have any questions please contact [phone].       Nursing notes have been reviewed by the physician/ advanced practice     Clinician.

## 2018-04-21 NOTE — ED Triage Notes (Signed)
Patient states she called her PCP office who told her to come in to ED for pain in right lower back radiating to right flank and right lower abdomen, began last night, states she continues to have mild nausea currently but last night was vomiting, denies fever/chills/diarrhea, states headache.

## 2018-04-21 NOTE — H&P (Signed)
Medicine History and Physical    Patient: [name] [name] Age: 48 y.o. Sex: female    Date of Birth: 01/04/1971 Admit Date: 04/21/2018 PCP: Marlowe Kays, MD   MRN: [id number]  CSN: [id number]         Assessment   Ureterolithiasis, 2 mm obstructing stone right UVJ, moderate hydronephrosis  Nausea and Vomiting  Abdominal pain   Hypokalemia   Acute renal insufficiency, Cr 2.0, recent baseline normal but patient states to have had CKD      Plan   IVF   Trend Cr, Avoid nephrotoxins   Follow urine culture  Trend WBC and fever curve  Urology consulted   Zofran PRN  Replacing K  Flomax  Strain urine  Diet NPO  DVT PPX ambulatory   ACP: CODE STATUS FULL CODE             Chief Complaint:  Chief Complaint   Patient presents with   ??? Back Pain   ??? Flank Pain         HPI:   [name] [name] is a 48 y.o. year old female who presents with    right-sided flank pain and right lower quadrant abdominal pain scribed as sharp that is been intermittent since onset yesterday evening, has had nausea and vomiting. Patient denies fever. No diarrhea. CT shows 2 mm obstructing stone right UVJ, moderate hydronephrosis and Nonobstructing left nephrolithiasis, largest stone measures 5 mm. In Ed, Urology consulted.       Review of Systems - 12 Point ROS -ve except what is noted in the HPI.     Past Medical History:  Past Medical History:   Diagnosis Date   ??? Chronic kidney disease     stage 3   ??? Fibromyalgia    ??? Palate mass        Past Surgical History:  Past Surgical History:   Procedure Laterality Date   ??? HX APPENDECTOMY     ??? HX CESAREAN SECTION     ??? HX HERNIA REPAIR      left   ??? HX OTHER SURGICAL      jaw surgery   ??? HX OVARIAN CYST REMOVAL     ??? HX SEPTOPLASTY     ??? HX TUBAL LIGATION         Family History:  Family History   Problem Relation Age of Onset   ??? Kidney Disease Mother    ??? Depression Mother    ??? Heart Disease Father        Social History:  Social History     Socioeconomic History   ??? Marital status: MARRIED      Spouse name: Not on file   ??? Number of children: Not on file   ??? Years of education: Not on file   ??? Highest education level: Not on file   Tobacco Use   ??? Smoking status: Never Smoker   ??? Smokeless tobacco: Never Used   Substance and Sexual Activity   ??? Alcohol use: No   ??? Drug use: No       Home Medications:  Prior to Admission medications    Medication Sig Start Date End Date Taking? Authorizing Provider   baclofen (LIORESAL) 10 mg tablet Take 10 mg by mouth three (3) times daily. Indications: as needed   Yes Provider, Historical   montelukast (SINGULAIR) 10 mg tablet Take 10 mg by mouth daily. Indications: Takes generic   Yes Provider, Historical   fluticasone  propionate (FLONASE) 50 mcg/actuation nasal spray 2 Sprays by Both Nostrils route daily.   Yes Provider, Historical   butalbital-acetaminophen-caffeine (FIORICET, ESGIC) 50-325-40 mg per tablet Take 1 Tab by mouth.   Yes Provider, Historical   calcium-cholecalciferol, D3, (CALTRATE 600+D) tablet Take 1 Tab by mouth daily.   Yes Provider, Historical   buPROPion XL (WELLBUTRIN XL) 300 mg XL tablet Take 300 mg by mouth every morning.   Yes Other, Phys, MD   MODAFINIL (PROVIGIL PO) Take  by mouth.   Yes Other, Phys, MD   gabapentin (NEURONTIN) 600 mg tablet Take 300 mg by mouth two (2) times a day. Indications: 300 twice daily as needed, 800 mg at bedtime   Yes Provider, Historical   topiramate (TOPAMAX) 100 mg tablet Take 200 mg by mouth nightly.   Yes Provider, Historical   traZODone (DESYREL) 100 mg tablet Take 100 mg by mouth nightly.   Yes Provider, Historical   dicyclomine (BENTYL) 20 mg tablet Take 20 mg by mouth nightly.   Yes Provider, Historical   albuterol (PROVENTIL HFA, VENTOLIN HFA, PROAIR HFA) 90 mcg/actuation inhaler Take 2 Puffs by inhalation every six (6) hours as needed for Wheezing.   Yes Provider, Historical   Cetirizine (ZYRTEC) 10 mg cap Take  by mouth.    Provider, Historical    azelastine (ASTEPRO) 0.15 % (205.5 mcg) 2 Sprays two (2) times a day.    Provider, Historical       Allergies:  Allergies   Allergen Reactions   ??? Augmentin [Amoxicillin-Pot Clavulanate] Diarrhea   ??? Demerol [Meperidine] Hives and Nausea and Vomiting         Physical Exam:     Visit Vitals  BP 110/62   Pulse 90   Temp 98.9 ??F (37.2 ??C)   Resp 16   Ht 4' 11.5" (1.511 m)   Wt 49.9 kg (110 lb)   SpO2 99%   BMI 21.85 kg/m??       Physical Exam:  General appearance: alert, cooperative, no distress, appears stated age  Head: Normocephalic, without obvious abnormality, atraumatic  Neck: supple, trachea midline  Lungs: clear to auscultation bilaterally  Heart: regular rate and rhythm, S1, S2 normal, no murmur, click, rub or gallop  Abdomen: soft, non-tender. Bowel sounds normal. No masses,  no organomegaly  Extremities: extremities normal, atraumatic, no cyanosis or edema  Skin: dry skin, dry MM  Neurologic: Grossly normal        Intake and Output:  Current Shift:  03/17 1901 - 03/18 0700  In: 1000 [I.V.:1000]  Out: -   Last three shifts:  No intake/output data recorded.    Lab/Data Reviewed:  Lab:   Recent Results (from the past 12 hour(s))   CBC WITH AUTOMATED DIFF    Collection Time: 04/21/18  5:45 PM   Result Value Ref Range    WBC 10.5 4.0 - 11.0 1000/mm3    RBC 3.66 3.60 - 5.20 M/uL    HGB 10.9 (L) 13.0 - 17.2 gm/dl    HCT 33.9 (L) 37.0 - 50.0 %    MCV 92.6 80.0 - 98.0 fL    MCH 29.8 25.4 - 34.6 pg    MCHC 32.2 30.0 - 36.0 gm/dl    PLATELET 350 140 - 450 1000/mm3    MPV 10.1 (H) 6.0 - 10.0 fL    RDW-SD 44.7 36.4 - 46.3      NRBC 0 0 - 0      IMMATURE GRANULOCYTES 0.5 0.0 -  3.0 %    NEUTROPHILS 69.3 (H) 34 - 64 %    LYMPHOCYTES 20.7 (L) 28 - 48 %    MONOCYTES 8.8 1 - 13 %    EOSINOPHILS 0.3 0 - 5 %    BASOPHILS 0.4 0 - 3 %   METABOLIC PANEL, COMPREHENSIVE    Collection Time: 04/21/18  5:45 PM   Result Value Ref Range    Sodium 141 136 - 145 mEq/L    Potassium 3.1 (L) 3.5 - 5.1 mEq/L    Chloride 110 (H) 98 - 107 mEq/L     CO2 26 21 - 32 mEq/L    Glucose 88 74 - 106 mg/dl    BUN 18 7 - 25 mg/dl    Creatinine 2.0 (H) 0.6 - 1.3 mg/dl    GFR est AA 34.0      GFR est non-AA 28      Calcium 8.9 8.5 - 10.1 mg/dl    AST (SGOT) 13 (L) 15 - 37 U/L    ALT (SGPT) 22 12 - 78 U/L    Alk. phosphatase 83 45 - 117 U/L    Bilirubin, total 0.5 0.2 - 1.0 mg/dl    Protein, total 7.5 6.4 - 8.2 gm/dl    Albumin 3.7 3.4 - 5.0 gm/dl    Anion gap 6 5 - 15 mmol/L   LIPASE    Collection Time: 04/21/18  5:45 PM   Result Value Ref Range    Lipase 164 73 - 393 U/L   POC HCG,URINE    Collection Time: 04/21/18  5:47 PM   Result Value Ref Range    HCG urine, QL negative NEGATIVE,Negative,negative     POC URINE MACROSCOPIC    Collection Time: 04/21/18  5:48 PM   Result Value Ref Range    Glucose Negative NEGATIVE,Negative mg/dl    Bilirubin Negative NEGATIVE,Negative      Ketone Negative NEGATIVE,Negative mg/dl    Specific gravity 1.020 1.005 - 1.030      Blood Trace-intact (A) NEGATIVE,Negative      pH (UA) 7.0 5 - 9      Protein Negative NEGATIVE,Negative mg/dl    Urobilinogen 0.2 0.0 - 1.0 EU/dl    Nitrites Negative NEGATIVE,Negative      Leukocyte Esterase Small (A) NEGATIVE,Negative      Color Yellow      Appearance Clear     POC URINE MICROSCOPIC    Collection Time: 04/21/18  5:48 PM   Result Value Ref Range    Epithelial cells, squamous 10-14 /LPF    WBC OCCASIONAL /HPF    RBC OCCASIONAL /HPF    Bacteria OCCASIONAL /HPF       Imaging:    Ct Abd Pelv Wo Cont    Result Date: 04/21/2018   DICOM format image data is available to non-affiliated external healthcare facilities or entities on a secure, media free, reciprocally searchable basis with patient authorization for 12 months following the date of the study. Clinical history: Right-sided flank and abdominal pain EXAMINATION: Noncontrast CT scan abdomen and pelvis 04/21/2018. 5 mm spiral scanning is performed from the costophrenic angles to the symphysis pubis. Coronal and sagittal reconstruction imaging has been obtained. Correlation: 02/17/2016 FINDINGS: Lung bases are clear. Mild degenerative changes of the spine liver, gallbladder, spleen, pancreas and adrenal glands are unremarkable. 2 mm obstructing stone right UVJ, moderate right hydronephrosis and perirenal/periureteric fat stranding. Nonobstructing left nephrolithiasis, largest stone measures 5 mm. Bladder is unremarkable. Intrauterine contraceptive device is in  satisfactory position. Bilateral tubal ligation clips. Appendix is not seen. Colon and terminal ileum are unremarkable. Stomach and small bowel bowel loops are within normal limits. Abdominal aorta is unremarkable. No dominant lymph node enlargement. Small amounts of free fluid.     IMPRESSION: 1. 2 mm obstructing stone right UVJ, moderate hydronephrosis. 2. Nonobstructing left nephrolithiasis, largest stone measures 5 mm. 3. Appendectomy and bilateral tubal ligation; IUD in satisfactory position.       Rosalene Billings, MD  April 21, 2018

## 2018-04-21 NOTE — ED Notes (Signed)
TRANSFER - OUT REPORT:    Verbal report given to Amy, RN(name) on [name] [name]  being transferred to 4217(unit) for routine progression of care       Report consisted of patient???s Situation, Background, Assessment and   Recommendations(SBAR).     Information from the following report(s) SBAR, ED Summary and MAR was reviewed with the receiving nurse.    Lines:   Peripheral IV 04/21/18 Left Antecubital (Active)        Opportunity for questions and clarification was provided.      Patient transported with:   Tech

## 2018-04-21 NOTE — ED Provider Notes (Signed)
[facility]  Emergency Department Treatment Report    Patient: [name] [name] Age: 48 y.o. Sex: female    Date of Birth: 1970/09/05 Admit Date: 04/21/2018 PCP: Marlowe Kays, MD   MRN: [id number]  CSN: [id number]  APC: Kriste Basque PA-C   Room: 4217/4217 Time Dictated: 5:26 PM Attending MD: Maylon Cos, MD       Chief Complaint   Chief Complaint   Patient presents with   ??? Back Pain   ??? Flank Pain        History of Present Illness   48 y.o. female with history of an appendectomy, right oophorectomy, tubal ligation, C-section, CKD presents due to right-sided flank pain and right lower quadrant abdominal pain scribed as sharp that is been intermittent since onset yesterday evening.  Pain is worsened with movement.  She reports associated nausea and vomiting.  States she vomited 7 times last night, none since.  She has been able to tolerate p.o. today.  She denies fever, chills, chest pain, shortness of breath, dysuria, hematuria, abnormal vaginal bleeding or discharge, diarrhea, constipation, recent falls or trauma or any other symptoms at this time.    Review of Systems   Review of Systems   Constitutional: Negative for chills and fever.   HENT: Negative for congestion and sore throat.    Eyes: Negative for blurred vision and double vision.   Respiratory: Negative for cough and shortness of breath.    Cardiovascular: Negative for chest pain.   Gastrointestinal: Positive for abdominal pain, nausea and vomiting. Negative for diarrhea.   Genitourinary: Positive for flank pain. Negative for dysuria.   Musculoskeletal: Negative for falls.   Skin: Negative for rash.   Neurological: Negative for loss of consciousness and headaches.       Past Medical/Surgical History     Past Medical History:   Diagnosis Date   ??? Chronic kidney disease     stage 3   ??? Fibromyalgia    ??? Palate mass      Past Surgical History:   Procedure Laterality Date   ??? HX APPENDECTOMY     ??? HX CESAREAN SECTION      ??? HX HERNIA REPAIR      left   ??? HX OTHER SURGICAL      jaw surgery   ??? HX OVARIAN CYST REMOVAL     ??? HX SEPTOPLASTY     ??? HX TUBAL LIGATION         Social History     Social History     Socioeconomic History   ??? Marital status: MARRIED     Spouse name: Not on file   ??? Number of children: Not on file   ??? Years of education: Not on file   ??? Highest education level: Not on file   Tobacco Use   ??? Smoking status: Never Smoker   ??? Smokeless tobacco: Never Used   Substance and Sexual Activity   ??? Alcohol use: No   ??? Drug use: No       Family History     Family History   Problem Relation Age of Onset   ??? Kidney Disease Mother    ??? Depression Mother    ??? Heart Disease Father        Current Medications     Prior to Admission Medications   Prescriptions Last Dose Informant Patient Reported? Taking?   Cetirizine (ZYRTEC) 10 mg cap Not Taking at Unknown time  Yes No   Sig: Take  by mouth.   MODAFINIL (PROVIGIL PO) 04/14/2018 at Unknown time  Yes Yes   Sig: Take  by mouth.   albuterol (PROVENTIL HFA, VENTOLIN HFA, PROAIR HFA) 90 mcg/actuation inhaler 04/14/2018 at Unknown time  Yes Yes   Sig: Take 2 Puffs by inhalation every six (6) hours as needed for Wheezing.   azelastine (ASTEPRO) 0.15 % (205.5 mcg) Not Taking at Unknown time  Yes No   Sig: 2 Sprays two (2) times a day.   baclofen (LIORESAL) 10 mg tablet 04/20/2018 at Unknown time  Yes Yes   Sig: Take 10 mg by mouth three (3) times daily. Indications: as needed   buPROPion XL (WELLBUTRIN XL) 300 mg XL tablet 04/20/2018 at Unknown time  Yes Yes   Sig: Take 300 mg by mouth every morning.   butalbital-acetaminophen-caffeine (FIORICET, ESGIC) 50-325-40 mg per tablet 04/14/2018 at Unknown time  Yes Yes   Sig: Take 1 Tab by mouth.   calcium-cholecalciferol, D3, (CALTRATE 600+D) tablet 04/20/2018 at Unknown time  Yes Yes   Sig: Take 1 Tab by mouth daily.   dicyclomine (BENTYL) 20 mg tablet 04/20/2018 at Unknown time  Yes Yes   Sig: Take 20 mg by mouth nightly.    fluticasone propionate (FLONASE) 50 mcg/actuation nasal spray 04/20/2018 at Unknown time  Yes Yes   Sig: 2 Sprays by Both Nostrils route daily.   gabapentin (NEURONTIN) 600 mg tablet 04/21/2018 at Unknown time  Yes Yes   Sig: Take 300 mg by mouth two (2) times a day. Indications: 300 twice daily as needed, 800 mg at bedtime   montelukast (SINGULAIR) 10 mg tablet 04/14/2018 at Unknown time  Yes Yes   Sig: Take 10 mg by mouth daily. Indications: Takes generic   topiramate (TOPAMAX) 100 mg tablet 04/14/2018 at Unknown time  Yes Yes   Sig: Take 200 mg by mouth nightly.   traZODone (DESYREL) 100 mg tablet 04/20/2018 at Unknown time  Yes Yes   Sig: Take 100 mg by mouth nightly.      Facility-Administered Medications: None       Allergies     Allergies   Allergen Reactions   ??? Augmentin [Amoxicillin-Pot Clavulanate] Diarrhea   ??? Demerol [Meperidine] Hives and Nausea and Vomiting       Physical Exam     ED Triage Vitals [04/21/18 1638]   ED Encounter Vitals Group      BP 113/59      Pulse (Heart Rate) 86      Resp Rate 16      Temp 98.6 ??F (37 ??C)      Temp src       O2 Sat (%) 100 %      Weight 110 lb      Height 4' 11.5"     Physical Exam  Vitals signs and nursing note reviewed.   Constitutional:       Appearance: Normal appearance.   HENT:      Head: Normocephalic and atraumatic.   Eyes:      General:         Right eye: No discharge.         Left eye: No discharge.   Neck:      Musculoskeletal: Normal range of motion and neck supple.   Cardiovascular:      Rate and Rhythm: Normal rate and regular rhythm.      Pulses:  Radial pulses are 2+ on the right side and 2+ on the left side.      Heart sounds: Normal heart sounds.   Pulmonary:      Effort: No tachypnea, accessory muscle usage or respiratory distress.      Breath sounds: No stridor. No decreased breath sounds, wheezing, rhonchi or rales.   Abdominal:      Palpations: Abdomen is soft.       Tenderness: There is abdominal tenderness in the right lower quadrant and epigastric area. There is right CVA tenderness. There is no guarding or rebound.   Musculoskeletal: Normal range of motion.   Skin:     General: Skin is warm and dry.      Findings: No rash.   Neurological:      Mental Status: She is alert and oriented to person, place, and time.   Psychiatric:         Mood and Affect: Affect normal.         Impression and Management Plan   Ddx: Pancreatitis, diverticulitis, colitis, nephrolithiasis, obstructive uropathy, gastritis, gastroenteritis, electrolyte abnormality    Plan: Will obtain basic labs, lipase, UA and CT scan abdomen pelvis without IV contrast.  Diagnostic Studies   Lab:   Recent Results (from the past 12 hour(s))   CBC WITH AUTOMATED DIFF    Collection Time: 04/21/18  5:45 PM   Result Value Ref Range    WBC 10.5 4.0 - 11.0 1000/mm3    RBC 3.66 3.60 - 5.20 M/uL    HGB 10.9 (L) 13.0 - 17.2 gm/dl    HCT 33.9 (L) 37.0 - 50.0 %    MCV 92.6 80.0 - 98.0 fL    MCH 29.8 25.4 - 34.6 pg    MCHC 32.2 30.0 - 36.0 gm/dl    PLATELET 350 140 - 450 1000/mm3    MPV 10.1 (H) 6.0 - 10.0 fL    RDW-SD 44.7 36.4 - 46.3      NRBC 0 0 - 0      IMMATURE GRANULOCYTES 0.5 0.0 - 3.0 %    NEUTROPHILS 69.3 (H) 34 - 64 %    LYMPHOCYTES 20.7 (L) 28 - 48 %    MONOCYTES 8.8 1 - 13 %    EOSINOPHILS 0.3 0 - 5 %    BASOPHILS 0.4 0 - 3 %   METABOLIC PANEL, COMPREHENSIVE    Collection Time: 04/21/18  5:45 PM   Result Value Ref Range    Sodium 141 136 - 145 mEq/L    Potassium 3.1 (L) 3.5 - 5.1 mEq/L    Chloride 110 (H) 98 - 107 mEq/L    CO2 26 21 - 32 mEq/L    Glucose 88 74 - 106 mg/dl    BUN 18 7 - 25 mg/dl    Creatinine 2.0 (H) 0.6 - 1.3 mg/dl    GFR est AA 34.0      GFR est non-AA 28      Calcium 8.9 8.5 - 10.1 mg/dl    AST (SGOT) 13 (L) 15 - 37 U/L    ALT (SGPT) 22 12 - 78 U/L    Alk. phosphatase 83 45 - 117 U/L    Bilirubin, total 0.5 0.2 - 1.0 mg/dl    Protein, total 7.5 6.4 - 8.2 gm/dl    Albumin 3.7 3.4 - 5.0 gm/dl     Anion gap 6 5 - 15 mmol/L   LIPASE    Collection Time: 04/21/18  5:45 PM  Result Value Ref Range    Lipase 164 73 - 393 U/L   POC HCG,URINE    Collection Time: 04/21/18  5:47 PM   Result Value Ref Range    HCG urine, QL negative NEGATIVE,Negative,negative     POC URINE MACROSCOPIC    Collection Time: 04/21/18  5:48 PM   Result Value Ref Range    Glucose Negative NEGATIVE,Negative mg/dl    Bilirubin Negative NEGATIVE,Negative      Ketone Negative NEGATIVE,Negative mg/dl    Specific gravity 1.020 1.005 - 1.030      Blood Trace-intact (A) NEGATIVE,Negative      pH (UA) 7.0 5 - 9      Protein Negative NEGATIVE,Negative mg/dl    Urobilinogen 0.2 0.0 - 1.0 EU/dl    Nitrites Negative NEGATIVE,Negative      Leukocyte Esterase Small (A) NEGATIVE,Negative      Color Yellow      Appearance Clear     POC URINE MICROSCOPIC    Collection Time: 04/21/18  5:48 PM   Result Value Ref Range    Epithelial cells, squamous 10-14 /LPF    WBC OCCASIONAL /HPF    RBC OCCASIONAL /HPF    Bacteria OCCASIONAL /HPF   POC URINE MACROSCOPIC    Collection Time: 04/21/18  8:56 PM   Result Value Ref Range    Glucose Negative NEGATIVE,Negative mg/dl    Bilirubin Negative NEGATIVE,Negative      Ketone Negative NEGATIVE,Negative mg/dl    Specific gravity 1.015 1.005 - 1.030      Blood Moderate (A) NEGATIVE,Negative      pH (UA) 6.0 5 - 9      Protein Negative NEGATIVE,Negative mg/dl    Urobilinogen 0.2 0.0 - 1.0 EU/dl    Nitrites Negative NEGATIVE,Negative      Leukocyte Esterase Negative NEGATIVE,Negative      Color Yellow      Appearance Clear         Imaging:    Ct Abd Pelv Wo Cont    Result Date: 04/21/2018   DICOM format image data is available to non-affiliated external healthcare facilities or entities on a secure, media free, reciprocally searchable basis with patient authorization for 12 months following the date of the study. Clinical history: Right-sided flank and abdominal pain EXAMINATION: Noncontrast CT scan abdomen and pelvis 04/21/2018. 5 mm spiral scanning is performed from the costophrenic angles to the symphysis pubis. Coronal and sagittal reconstruction imaging has been obtained. Correlation: 02/17/2016 FINDINGS: Lung bases are clear. Mild degenerative changes of the spine liver, gallbladder, spleen, pancreas and adrenal glands are unremarkable. 2 mm obstructing stone right UVJ, moderate right hydronephrosis and perirenal/periureteric fat stranding. Nonobstructing left nephrolithiasis, largest stone measures 5 mm. Bladder is unremarkable. Intrauterine contraceptive device is in satisfactory position. Bilateral tubal ligation clips. Appendix is not seen. Colon and terminal ileum are unremarkable. Stomach and small bowel bowel loops are within normal limits. Abdominal aorta is unremarkable. No dominant lymph node enlargement. Small amounts of free fluid.     IMPRESSION: 1. 2 mm obstructing stone right UVJ, moderate hydronephrosis. 2. Nonobstructing left nephrolithiasis, largest stone measures 5 mm. 3. Appendectomy and bilateral tubal ligation; IUD in satisfactory position.       ED Course     Patient Vitals for the past 12 hrs:   Temp Pulse Resp BP SpO2   04/21/18 2328 98.6 ??F (37 ??C) 80 20 96/51 100 %   04/21/18 2242 100.2 ??F (37.9 ??C) 85 20 108/60 99 %  04/21/18 2206 ??? 89 ??? 109/59 100 %   04/21/18 1936 98.9 ??F (37.2 ??C) 90 16 110/62 99 %   04/21/18 1638 98.6 ??F (37 ??C) 86 16 113/59 100 %     Medications   tamsulosin (FLOMAX) capsule 0.4 mg (has no administration in time range)   baclofen (LIORESAL) tablet 10 mg (10 mg Oral Given 04/21/18 2326)    buPROPion XL (WELLBUTRIN XL) tablet 300 mg (has no administration in time range)   fluticasone propionate (FLONASE) 50 mcg/actuation nasal spray 2 Spray (has no administration in time range)   dicyclomine (BENTYL) capsule 20 mg (20 mg Oral Given 04/21/18 2326)   montelukast (SINGULAIR) tablet 10 mg (has no administration in time range)   topiramate (TOPAMAX) tablet 200 mg (200 mg Oral Given 04/21/18 2326)   sodium chloride (NS) flush 5-10 mL ( IntraVENous Canceled Entry 04/22/18 0600)   sodium chloride (NS) flush 5-10 mL (has no administration in time range)   naloxone (NARCAN) injection 0.1 mg (has no administration in time range)   acetaminophen (TYLENOL) tablet 650 mg (650 mg Oral Given 04/21/18 2202)   0.9% sodium chloride infusion (100 mL/hr IntraVENous New Bag 04/21/18 2239)   ondansetron (ZOFRAN) injection 4 mg (has no administration in time range)   morphine injection 2 mg (has no administration in time range)   gabapentin (NEURONTIN) capsule 300 mg (has no administration in time range)   gabapentin (NEURONTIN) capsule 800 mg (800 mg Oral Given 04/21/18 2346)   sodium chloride (NS) flush 5-10 mL (10 mL IntraVENous Given 04/21/18 1747)   sodium chloride 0.9 % bolus infusion 1,000 mL (0 mL IntraVENous IV Completed 04/21/18 2032)   ondansetron (ZOFRAN) injection 4 mg (4 mg IntraVENous Given 04/21/18 1746)   morphine injection 4 mg (4 mg IntraVENous Given 04/21/18 1746)   tamsulosin (FLOMAX) capsule 0.4 mg (0.4 mg Oral Given 04/21/18 2038)        Medical Decision Making      48 yo female with history of an appendectomy, right oophorectomy, tubal ligation, C-section, CKD presents due to right-sided flank pain and right lower quadrant abdominal pain scribed as sharp that is been intermittent since onset yesterday evening.  Pain is worsened with movement.  She reports associated nausea and vomiting.  States she vomited 7 times last night, none since.  She has been able to tolerate p.o. today.  She denies fever, chills, chest pain, shortness of breath, dysuria, hematuria, abnormal vaginal bleeding or discharge, diarrhea, constipation, recent falls or trauma or any other symptoms at this time.  Physical exam as above.  Basic labs revealed AKI.  UA appears contaminated therefore we got a catheter sample which did not appear infected.  CT showed 2 mm obstructive stone in the right UVJ.  Dr. Jess Barters discussed the case with urology who recommended admission to medicine and they would evaluate in the morning given AKI in the setting of obstructive uropathy.  Will admit for further evaluation and management.  Final Diagnosis       ICD-10-CM ICD-9-CM   1. Obstructive uropathy N13.9 599.60   2. AKI (acute kidney injury) ([place]) N17.9 584.9       Disposition   Admit.  Current Discharge Medication List          Kriste Basque PA-C  April 22, 2018  5:26 PM      The patient was personally evaluated by myself. Discussed with and or seen by Dr. Maylon Cos, MD who agrees with the above  assessment and plan.    *Portions of this electronic record were dictated using Dragon voice recognition software.  Unintended errors in translation may occur.    My signature above authenticates this document and my orders, the final    diagnosis (es), discharge prescription (s), and instructions in the Epic    record.  If you have any questions please contact [phone].     Nursing notes have been reviewed by the physician/ advanced practice    Clinician.

## 2018-04-22 LAB — CBC WITH AUTO DIFFERENTIAL
Basophils %: 0.5 % (ref 0–3)
Eosinophils %: 0.5 % (ref 0–5)
Hematocrit: 28.2 % — ABNORMAL LOW (ref 37.0–50.0)
Hemoglobin: 9 gm/dl — ABNORMAL LOW (ref 13.0–17.2)
Immature Granulocytes: 0.1 % (ref 0.0–3.0)
Lymphocytes %: 24.5 % — ABNORMAL LOW (ref 28–48)
MCH: 30.5 pg (ref 25.4–34.6)
MCHC: 31.9 gm/dl (ref 30.0–36.0)
MCV: 95.6 fL (ref 80.0–98.0)
MPV: 10.2 fL — ABNORMAL HIGH (ref 6.0–10.0)
Monocytes %: 2.5 % (ref 1–13)
Neutrophils %: 71.9 % — ABNORMAL HIGH (ref 34–64)
Nucleated RBCs: 0 (ref 0–0)
Platelets: 264 10*3/uL (ref 140–450)
RBC: 2.95 M/uL — ABNORMAL LOW (ref 3.60–5.20)
RDW-SD: 45.1 (ref 36.4–46.3)
WBC: 7.6 10*3/uL (ref 4.0–11.0)

## 2018-04-22 LAB — POC URINE MACROSCOPIC
Bilirubin, Urine: NEGATIVE
Bilirubin: NEGATIVE
Glucose, Ur: NEGATIVE mg/dl
Glucose: NEGATIVE mg/dl
Ketone: NEGATIVE mg/dl
Ketones, Urine: NEGATIVE mg/dl
Leukocyte Esterase, Urine: NEGATIVE
Leukocyte Esterase: NEGATIVE
Nitrite, Urine: NEGATIVE
Nitrites: NEGATIVE
Protein, UA: NEGATIVE mg/dl
Protein: NEGATIVE mg/dl
Specific Gravity, UA: 1.015 (ref 1.005–1.030)
Specific gravity: 1.015 (ref 1.005–1.030)
Urobilinogen, UA, POCT: 0.2 EU/dl (ref 0.0–1.0)
Urobilinogen: 0.2 EU/dl (ref 0.0–1.0)
pH (UA): 6 (ref 5–9)
pH, UA: 6 (ref 5–9)

## 2018-04-22 LAB — COMPREHENSIVE METABOLIC PANEL
ALT: 18 U/L (ref 12–78)
AST: 17 U/L (ref 15–37)
Albumin: 2.8 gm/dl — ABNORMAL LOW (ref 3.4–5.0)
Alkaline Phosphatase: 63 U/L (ref 45–117)
Anion Gap: 5 mmol/L (ref 5–15)
BUN: 16 mg/dl (ref 7–25)
CO2: 22 mEq/L (ref 21–32)
Calcium: 8.1 mg/dl — ABNORMAL LOW (ref 8.5–10.1)
Chloride: 115 mEq/L — ABNORMAL HIGH (ref 98–107)
Creatinine: 2.1 mg/dl — ABNORMAL HIGH (ref 0.6–1.3)
EGFR IF NonAfrican American: 27
GFR African American: 32
Glucose: 94 mg/dl (ref 74–106)
Potassium: 4.2 mEq/L (ref 3.5–5.1)
Sodium: 142 mEq/L (ref 136–145)
Total Bilirubin: 0.5 mg/dl (ref 0.2–1.0)
Total Protein: 5.7 gm/dl — ABNORMAL LOW (ref 6.4–8.2)

## 2018-04-22 LAB — CBC WITH AUTOMATED DIFF
BASOPHILS: 0.5 % (ref 0–3)
EOSINOPHILS: 0.5 % (ref 0–5)
HCT: 28.2 % — ABNORMAL LOW (ref 37.0–50.0)
HGB: 9 gm/dl — ABNORMAL LOW (ref 13.0–17.2)
IMMATURE GRANULOCYTES: 0.1 % (ref 0.0–3.0)
LYMPHOCYTES: 24.5 % — ABNORMAL LOW (ref 28–48)
MCH: 30.5 pg (ref 25.4–34.6)
MCHC: 31.9 gm/dl (ref 30.0–36.0)
MCV: 95.6 fL (ref 80.0–98.0)
MONOCYTES: 2.5 % (ref 1–13)
MPV: 10.2 fL — ABNORMAL HIGH (ref 6.0–10.0)
NEUTROPHILS: 71.9 % — ABNORMAL HIGH (ref 34–64)
NRBC: 0 (ref 0–0)
PLATELET: 264 10*3/uL (ref 140–450)
RBC: 2.95 M/uL — ABNORMAL LOW (ref 3.60–5.20)
RDW-SD: 45.1 (ref 36.4–46.3)
WBC: 7.6 10*3/uL (ref 4.0–11.0)

## 2018-04-22 LAB — METABOLIC PANEL, COMPREHENSIVE
ALT (SGPT): 18 U/L (ref 12–78)
AST (SGOT): 17 U/L (ref 15–37)
Albumin: 2.8 gm/dl — ABNORMAL LOW (ref 3.4–5.0)
Alk. phosphatase: 63 U/L (ref 45–117)
Anion gap: 5 mmol/L (ref 5–15)
BUN: 16 mg/dl (ref 7–25)
Bilirubin, total: 0.5 mg/dl (ref 0.2–1.0)
CO2: 22 mEq/L (ref 21–32)
Calcium: 8.1 mg/dl — ABNORMAL LOW (ref 8.5–10.1)
Chloride: 115 mEq/L — ABNORMAL HIGH (ref 98–107)
Creatinine: 2.1 mg/dl — ABNORMAL HIGH (ref 0.6–1.3)
GFR est AA: 32
GFR est non-AA: 27
Glucose: 94 mg/dl (ref 74–106)
Potassium: 4.2 mEq/L (ref 3.5–5.1)
Protein, total: 5.7 gm/dl — ABNORMAL LOW (ref 6.4–8.2)
Sodium: 142 mEq/L (ref 136–145)

## 2018-04-22 MED ORDER — GABAPENTIN 300 MG CAP
300 mg | Freq: Every day | ORAL | Status: DC
Start: 2018-04-22 — End: 2018-04-24
  Administered 2018-04-22 – 2018-04-24 (×3): via ORAL

## 2018-04-22 MED ORDER — MORPHINE 2 MG/ML INJECTION
2 mg/mL | INTRAMUSCULAR | Status: DC | PRN
Start: 2018-04-22 — End: 2018-04-24
  Administered 2018-04-22 (×5): via INTRAVENOUS

## 2018-04-22 MED ORDER — MONTELUKAST 10 MG TAB
10 mg | Freq: Every day | ORAL | Status: DC
Start: 2018-04-22 — End: 2018-04-23
  Administered 2018-04-22: 15:00:00 via ORAL

## 2018-04-22 MED ORDER — ACETAMINOPHEN 325 MG TABLET
325 mg | Freq: Four times a day (QID) | ORAL | Status: DC | PRN
Start: 2018-04-22 — End: 2018-04-24
  Administered 2018-04-22 – 2018-04-24 (×5): via ORAL

## 2018-04-22 MED ORDER — TAMSULOSIN SR 0.4 MG 24 HR CAP
0.4 mg | ORAL | Status: AC
Start: 2018-04-22 — End: 2018-04-21
  Administered 2018-04-22: 01:00:00 via ORAL

## 2018-04-22 MED ORDER — .PHARMACY TO SUBSTITUTE PER PROTOCOL
Status: DC | PRN
Start: 2018-04-22 — End: 2018-04-21

## 2018-04-22 MED ORDER — DICYCLOMINE 10 MG CAP
10 mg | Freq: Every evening | ORAL | Status: DC
Start: 2018-04-22 — End: 2018-04-24
  Administered 2018-04-22 – 2018-04-24 (×3): via ORAL

## 2018-04-22 MED ORDER — SODIUM CHLORIDE 0.9 % IJ SYRG
Freq: Three times a day (TID) | INTRAMUSCULAR | Status: DC
Start: 2018-04-22 — End: 2018-04-24
  Administered 2018-04-22 – 2018-04-24 (×8): via INTRAVENOUS

## 2018-04-22 MED ORDER — ONDANSETRON (PF) 4 MG/2 ML INJECTION
4 mg/2 mL | Freq: Three times a day (TID) | INTRAMUSCULAR | Status: DC | PRN
Start: 2018-04-22 — End: 2018-04-24
  Administered 2018-04-22 – 2018-04-24 (×4): via INTRAVENOUS

## 2018-04-22 MED ORDER — GABAPENTIN 400 MG CAP
400 mg | Freq: Every evening | ORAL | Status: DC
Start: 2018-04-22 — End: 2018-04-24
  Administered 2018-04-22 – 2018-04-24 (×3): via ORAL

## 2018-04-22 MED ORDER — TOPIRAMATE 100 MG TAB
100 mg | Freq: Every evening | ORAL | Status: DC
Start: 2018-04-22 — End: 2018-04-24
  Administered 2018-04-22 – 2018-04-24 (×3): via ORAL

## 2018-04-22 MED ORDER — BUPROPION XL 300 MG 24 HR TAB
300 mg | ORAL | Status: DC
Start: 2018-04-22 — End: 2018-04-24
  Administered 2018-04-22 – 2018-04-24 (×3): via ORAL

## 2018-04-22 MED ORDER — FLUTICASONE 50 MCG/ACTUATION NASAL SPRAY, SUSP
50 mcg/actuation | Freq: Every day | NASAL | Status: DC
Start: 2018-04-22 — End: 2018-04-24
  Administered 2018-04-22 – 2018-04-24 (×3): via NASAL

## 2018-04-22 MED ORDER — BACLOFEN 10 MG TAB
10 mg | Freq: Three times a day (TID) | ORAL | Status: DC
Start: 2018-04-22 — End: 2018-04-24
  Administered 2018-04-22 – 2018-04-24 (×7): via ORAL

## 2018-04-22 MED ORDER — NALOXONE 0.4 MG/ML INJECTION
0.4 mg/mL | INTRAMUSCULAR | Status: DC | PRN
Start: 2018-04-22 — End: 2018-04-24

## 2018-04-22 MED ORDER — TAMSULOSIN SR 0.4 MG 24 HR CAP
0.4 mg | Freq: Every day | ORAL | Status: DC
Start: 2018-04-22 — End: 2018-04-24
  Administered 2018-04-22 – 2018-04-24 (×3): via ORAL

## 2018-04-22 MED ORDER — SODIUM CHLORIDE 0.9 % IJ SYRG
INTRAMUSCULAR | Status: DC | PRN
Start: 2018-04-22 — End: 2018-04-24

## 2018-04-22 MED ORDER — SODIUM CHLORIDE 0.9 % IV
INTRAVENOUS | Status: AC
Start: 2018-04-22 — End: 2018-04-22
  Administered 2018-04-22 (×2): via INTRAVENOUS

## 2018-04-22 MED FILL — GABAPENTIN 300 MG CAP: 300 mg | ORAL | Qty: 1

## 2018-04-22 MED FILL — SODIUM CHLORIDE 0.9 % IV: INTRAVENOUS | Qty: 1000

## 2018-04-22 MED FILL — GABAPENTIN 400 MG CAP: 400 mg | ORAL | Qty: 2

## 2018-04-22 MED FILL — TOPIRAMATE 100 MG TAB: 100 mg | ORAL | Qty: 2

## 2018-04-22 MED FILL — MORPHINE 2 MG/ML INJECTION: 2 mg/mL | INTRAMUSCULAR | Qty: 1

## 2018-04-22 MED FILL — ACETAMINOPHEN 325 MG TABLET: 325 mg | ORAL | Qty: 2

## 2018-04-22 MED FILL — MONTELUKAST 10 MG TAB: 10 mg | ORAL | Qty: 1

## 2018-04-22 MED FILL — ONDANSETRON (PF) 4 MG/2 ML INJECTION: 4 mg/2 mL | INTRAMUSCULAR | Qty: 2

## 2018-04-22 MED FILL — BUPROPION XL 300 MG 24 HR TAB: 300 mg | ORAL | Qty: 1

## 2018-04-22 MED FILL — TAMSULOSIN SR 0.4 MG 24 HR CAP: 0.4 mg | ORAL | Qty: 1

## 2018-04-22 MED FILL — FLUTICASONE 50 MCG/ACTUATION NASAL SPRAY, SUSP: 50 mcg/actuation | NASAL | Qty: 16

## 2018-04-22 MED FILL — BACLOFEN 10 MG TAB: 10 mg | ORAL | Qty: 1

## 2018-04-22 MED FILL — BD POSIFLUSH NORMAL SALINE 0.9 % INJECTION SYRINGE: INTRAMUSCULAR | Qty: 10

## 2018-04-22 MED FILL — DICYCLOMINE 10 MG CAP: 10 mg | ORAL | Qty: 2

## 2018-04-22 NOTE — Progress Notes (Signed)
INTERNAL MEDICINE PROGRESS NOTE  Patient: [name] [name]   Date of Birth: [birth year]   MRN: [id number]        Assessment      Principle Problems:  Ureterolithiasis, 2 mm obstructing stone right UVJ, moderate hydronephrosis  Abdominal pain , Nausea and Vomiting - due to the above   Hypokalemia : mild , corrected   Acute renal insufficiency   - presented with complain of abd pain, N/V, on admission afebrile, vitals stable, WBC normal, Hgb 9, Cr 2.1, UA showed hematuria, LFT and lipase normal   - CT : 2 mm obstructing stone right UVJ, moderate hydronephrosis., Nonobstructing left nephrolithiasis, largest stone measures 5 mm.  - admitted to med surg, urine culture, IVF support, pain control, urology consulted , flomax added        Active Problems:  Chronic anemia - stable   Depression   Allergy   BPH  Fibromyalgia     Code Status: Full code   DVT prophylaxis: pharmacologic and mechanical  Disposition: home     Today Plan:   Still having pain and nausea   Continue with IVF support  Monitor renal function  Antinausea   Monitor urine culture, Abx if positive   Urology recommendations - she is NPO       Subjective:   Patient states she still in severe pain and having nausea  , no fever     Medical Decision Making   Chart, Images and Lab data reviewed, necessary medical Orders placed   Discussed with nursing staff     Vitals:    04/21/18 2242 04/21/18 2328 04/22/18 0449 04/22/18 0737   BP: 1'08/60 96/51 99/54 '$ 113/54   Pulse: 85 80 68 69   Resp: '20 20 20 16   '$ Temp: 100.2 ??F (37.9 ??C) 98.6 ??F (37 ??C) 99.7 ??F (37.6 ??C) 99.1 ??F (37.3 ??C)   SpO2: 99% 100% 100% 98%   Weight:       Height:         Temp (24hrs), Avg:99.2 ??F (37.3 ??C), Min:98.6 ??F (37 ??C), Max:100.2 ??F (37.9 ??C)      Intake/Output Summary (Last 24 hours) at 04/22/2018 0908  Last data filed at 04/22/2018 0615  Gross per 24 hour   Intake 1760 ml   Output 250 ml   Net 1510 ml       Physical Exam:   General Appearance:   Appears in no acute distress.,  Skin:   Skin warm &  dry, No rash, No jaundice,   Lymph:  There is no lymphadenopathy,   HEENT:   PERRLA, EOMI, Moist oral mucous membranes, conjunctiva clear,   Neck:   Supple, Without masses,   Lungs:   Clear, No wheezes., No rales., Normal respiratory effort,   Heart:   Regular rate and rhythm, No gallop,   Abdomen:   Soft , Non-distended, Normal bowel sounds and tender Rt angle   Extremities:    edema of legs, Normal pedal and radial pulses,   Neuro:   alert, oriented, affect appropriate, speech fluent, cranial nerves intact, no focal neurological deficits and moves all extremities well    Current medications:     Current Facility-Administered Medications   Medication Dose Route Frequency   ??? tamsulosin (FLOMAX) capsule 0.4 mg  0.4 mg Oral DAILY   ??? baclofen (LIORESAL) tablet 10 mg  10 mg Oral TID   ??? buPROPion XL (WELLBUTRIN XL) tablet 300 mg  300 mg Oral 7am   ???  fluticasone propionate (FLONASE) 50 mcg/actuation nasal spray 2 Spray  2 Spray Both Nostrils DAILY   ??? dicyclomine (BENTYL) capsule 20 mg  20 mg Oral QHS   ??? montelukast (SINGULAIR) tablet 10 mg  10 mg Oral DAILY   ??? topiramate (TOPAMAX) tablet 200 mg  200 mg Oral QHS   ??? sodium chloride (NS) flush 5-10 mL  5-10 mL IntraVENous Q8H   ??? sodium chloride (NS) flush 5-10 mL  5-10 mL IntraVENous PRN   ??? naloxone (NARCAN) injection 0.1 mg  0.1 mg IntraVENous PRN   ??? acetaminophen (TYLENOL) tablet 650 mg  650 mg Oral Q6H PRN   ??? 0.9% sodium chloride infusion  100 mL/hr IntraVENous CONTINUOUS   ??? ondansetron (ZOFRAN) injection 4 mg  4 mg IntraVENous Q8H PRN   ??? morphine injection 2 mg  2 mg IntraVENous Q4H PRN   ??? gabapentin (NEURONTIN) capsule 300 mg  300 mg Oral DAILY   ??? gabapentin (NEURONTIN) capsule 800 mg  800 mg Oral QHS          Laboratory and Radiology Data :     Recent Results (from the past 24 hour(s))   CBC WITH AUTOMATED DIFF    Collection Time: 04/21/18  5:45 PM   Result Value Ref Range    WBC 10.5 4.0 - 11.0 1000/mm3    RBC 3.66 3.60 - 5.20 M/uL    HGB 10.9 (L) 13.0  - 17.2 gm/dl    HCT 33.9 (L) 37.0 - 50.0 %    MCV 92.6 80.0 - 98.0 fL    MCH 29.8 25.4 - 34.6 pg    MCHC 32.2 30.0 - 36.0 gm/dl    PLATELET 350 140 - 450 1000/mm3    MPV 10.1 (H) 6.0 - 10.0 fL    RDW-SD 44.7 36.4 - 46.3      NRBC 0 0 - 0      IMMATURE GRANULOCYTES 0.5 0.0 - 3.0 %    NEUTROPHILS 69.3 (H) 34 - 64 %    LYMPHOCYTES 20.7 (L) 28 - 48 %    MONOCYTES 8.8 1 - 13 %    EOSINOPHILS 0.3 0 - 5 %    BASOPHILS 0.4 0 - 3 %   METABOLIC PANEL, COMPREHENSIVE    Collection Time: 04/21/18  5:45 PM   Result Value Ref Range    Sodium 141 136 - 145 mEq/L    Potassium 3.1 (L) 3.5 - 5.1 mEq/L    Chloride 110 (H) 98 - 107 mEq/L    CO2 26 21 - 32 mEq/L    Glucose 88 74 - 106 mg/dl    BUN 18 7 - 25 mg/dl    Creatinine 2.0 (H) 0.6 - 1.3 mg/dl    GFR est AA 34.0      GFR est non-AA 28      Calcium 8.9 8.5 - 10.1 mg/dl    AST (SGOT) 13 (L) 15 - 37 U/L    ALT (SGPT) 22 12 - 78 U/L    Alk. phosphatase 83 45 - 117 U/L    Bilirubin, total 0.5 0.2 - 1.0 mg/dl    Protein, total 7.5 6.4 - 8.2 gm/dl    Albumin 3.7 3.4 - 5.0 gm/dl    Anion gap 6 5 - 15 mmol/L   LIPASE    Collection Time: 04/21/18  5:45 PM   Result Value Ref Range    Lipase 164 73 - 393 U/L   POC HCG,URINE    Collection  Time: 04/21/18  5:47 PM   Result Value Ref Range    HCG urine, QL negative NEGATIVE,Negative,negative     POC URINE MACROSCOPIC    Collection Time: 04/21/18  5:48 PM   Result Value Ref Range    Glucose Negative NEGATIVE,Negative mg/dl    Bilirubin Negative NEGATIVE,Negative      Ketone Negative NEGATIVE,Negative mg/dl    Specific gravity 1.020 1.005 - 1.030      Blood Trace-intact (A) NEGATIVE,Negative      pH (UA) 7.0 5 - 9      Protein Negative NEGATIVE,Negative mg/dl    Urobilinogen 0.2 0.0 - 1.0 EU/dl    Nitrites Negative NEGATIVE,Negative      Leukocyte Esterase Small (A) NEGATIVE,Negative      Color Yellow      Appearance Clear     POC URINE MICROSCOPIC    Collection Time: 04/21/18  5:48 PM   Result Value Ref Range    Epithelial cells, squamous 10-14  /LPF    WBC OCCASIONAL /HPF    RBC OCCASIONAL /HPF    Bacteria OCCASIONAL /HPF   CULTURE, URINE    Collection Time: 04/21/18  8:48 PM   Result Value Ref Range    Culture result No Growth To Date     POC URINE MACROSCOPIC    Collection Time: 04/21/18  8:56 PM   Result Value Ref Range    Glucose Negative NEGATIVE,Negative mg/dl    Bilirubin Negative NEGATIVE,Negative      Ketone Negative NEGATIVE,Negative mg/dl    Specific gravity 1.015 1.005 - 1.030      Blood Moderate (A) NEGATIVE,Negative      pH (UA) 6.0 5 - 9      Protein Negative NEGATIVE,Negative mg/dl    Urobilinogen 0.2 0.0 - 1.0 EU/dl    Nitrites Negative NEGATIVE,Negative      Leukocyte Esterase Negative NEGATIVE,Negative      Color Yellow      Appearance Clear     CBC WITH AUTOMATED DIFF    Collection Time: 04/22/18  3:25 AM   Result Value Ref Range    WBC 7.6 4.0 - 11.0 1000/mm3    RBC 2.95 (L) 3.60 - 5.20 M/uL    HGB 9.0 (L) 13.0 - 17.2 gm/dl    HCT 28.2 (L) 37.0 - 50.0 %    MCV 95.6 80.0 - 98.0 fL    MCH 30.5 25.4 - 34.6 pg    MCHC 31.9 30.0 - 36.0 gm/dl    PLATELET 264 140 - 450 1000/mm3    MPV 10.2 (H) 6.0 - 10.0 fL    RDW-SD 45.1 36.4 - 46.3      NRBC 0 0 - 0      IMMATURE GRANULOCYTES 0.1 0.0 - 3.0 %    NEUTROPHILS 71.9 (H) 34 - 64 %    LYMPHOCYTES 24.5 (L) 28 - 48 %    MONOCYTES 2.5 1 - 13 %    EOSINOPHILS 0.5 0 - 5 %    BASOPHILS 0.5 0 - 3 %   METABOLIC PANEL, COMPREHENSIVE    Collection Time: 04/22/18  3:25 AM   Result Value Ref Range    Sodium 142 136 - 145 mEq/L    Potassium 4.2 3.5 - 5.1 mEq/L    Chloride 115 (H) 98 - 107 mEq/L    CO2 22 21 - 32 mEq/L    Glucose 94 74 - 106 mg/dl    BUN 16 7 - 25 mg/dl  Creatinine 2.1 (H) 0.6 - 1.3 mg/dl    GFR est AA 32.0      GFR est non-AA 27      Calcium 8.1 (L) 8.5 - 10.1 mg/dl    AST (SGOT) 17 15 - 37 U/L    ALT (SGPT) 18 12 - 78 U/L    Alk. phosphatase 63 45 - 117 U/L    Bilirubin, total 0.5 0.2 - 1.0 mg/dl    Protein, total 5.7 (L) 6.4 - 8.2 gm/dl    Albumin 2.8 (L) 3.4 - 5.0 gm/dl    Anion gap 5 5 -  15 mmol/L       XR Results:  Results from Hospital Encounter encounter on 01/09/18   XR HAND RT MIN 3 V    Narrative INDICATION:  Pain        EXAMINATION:  XR HAND RT MIN 3 V    COMPARISON:  None    FINDINGS:  There is no fracture or dislocation. There is foreshortening of the third fourth  and fifth metacarpals.      Impression IMPRESSION:  1. Negative for fracture.  2. Foreshortening of the third through fifth metacarpals.         CT Results:  Results from [facility] encounter on 04/21/18   CT ABD PELV WO CONT    Narrative DICOM format image data is available to non-affiliated external healthcare  facilities or entities on a secure, media free, reciprocally searchable basis  with patient authorization for 12 months following the date of the study.    Clinical history: Right-sided flank and abdominal pain    EXAMINATION:  Noncontrast CT scan abdomen and pelvis 04/21/2018. 5 mm spiral scanning is  performed from the costophrenic angles to the symphysis pubis. Coronal and  sagittal reconstruction imaging has been obtained.    Correlation: 02/17/2016    FINDINGS:  Lung bases are clear. Mild degenerative changes of the spine liver, gallbladder,  spleen, pancreas and adrenal glands are unremarkable.    2 mm obstructing stone right UVJ, moderate right hydronephrosis and  perirenal/periureteric fat stranding. Nonobstructing left nephrolithiasis,  largest stone measures 5 mm.    Bladder is unremarkable. Intrauterine contraceptive device is in satisfactory  position. Bilateral tubal ligation clips. Appendix is not seen. Colon and  terminal ileum are unremarkable.    Stomach and small bowel bowel loops are within normal limits. Abdominal aorta is  unremarkable. No dominant lymph node enlargement. Small amounts of free fluid.      Impression IMPRESSION:  1. 2 mm obstructing stone right UVJ, moderate hydronephrosis.  2. Nonobstructing left nephrolithiasis, largest stone measures 5 mm.  3. Appendectomy and bilateral tubal  ligation; IUD in satisfactory position.         MRI Results:  Results from [facility] encounter on 05/27/14   MRI BRAIN WO CONT    Narrative MRI brain without contrast    Indication: Multiple sclerosis    Comparison: 07/09/2010    Technique: Multiplanar, multisequence MR images of the brain were obtained  without contrast.     Findings:  Several subcentimeter foci increased T2 / FLAIR signal in the periventricular  and subcortical white matter as can be seen with provided history of  demyelinating disease appear similar to prior exam.  No new lesion seen.  No  infratentorial lesions. No restricted diffusion.    No abnormal intra or extra-axial fluid collections, midline shift, chronic  hemorrhage, or hydrocephalus.     No restricted diffusion or evidence  for acute ischemia    Mucosal thickening noted in both maxillary and sphenoid sinuses with post  surgical changes similar to prior exam. Mastoid air cells are clear.       Flow-voids are maintained.      Impression IMPRESSION: Stable white matter lesions.         Nuclear Medicine Results:  Results from [facility] encounter on 05/16/14   TRANSCRIBED NUCLEAR MEDICINE       [place] Results:  No results found for this or any previous visit.    IR Results:  No results found for this or any previous visit.    VAS/US Results:  Results from Hospital Encounter encounter on 02/17/16   DUPLEX ABD VISC ART/VEN/ORGANS COMPLETE               Christy Sartorius M.D.  [facility]  Page [phone]

## 2018-04-22 NOTE — Progress Notes (Signed)
Pt voided and PVR 91ml.

## 2018-04-22 NOTE — Progress Notes (Signed)
Problem: Falls - Risk of  Goal: *Absence of Falls  Description: Document Schmid Fall Risk and appropriate interventions in the flowsheet.  Outcome: Progressing Towards Goal  Note: Fall Risk Interventions:            Medication Interventions: Patient to call before getting OOB, Teach patient to arise slowly                   Problem: Patient Education: Go to Patient Education Activity  Goal: Patient/Family Education  Outcome: Progressing Towards Goal     Problem: Pain  Goal: *Control of Pain  Outcome: Progressing Towards Goal     Problem: Patient Education: Go to Patient Education Activity  Goal: Patient/Family Education  Outcome: Progressing Towards Goal

## 2018-04-22 NOTE — Progress Notes (Signed)
Pt voided 200 ml PVR 33 ml

## 2018-04-22 NOTE — Progress Notes (Signed)
Patient admitted on 04/21/2018 from ED from home with   Chief Complaint   Patient presents with   . Back Pain   . Flank Pain          The patient is being treated for    PMH:   Past Medical History:   Diagnosis Date   . Chronic kidney disease     stage 3   . Fibromyalgia    . Palate mass         Treatment Team: Treatment Team: Attending Provider: Shanon Rosser, MD; Consulting Provider: Norva Pavlov, MD; Consulting Provider: Irving Burton, MD; Consulting Provider: Gevena Mart, MD; Hospitalist: Shanon Rosser, MD; Primary Nurse: Enos Fling, RN; Care Manager: Candace Cruise      The patient has been admitted to the hospital 0 times in the past 12 months.    Previous 4 Admission Dates Admission and Discharge Diagnosis Interventions Barriers Disposition                                 Patient and Family/Caregivers Goals of Care: tx pain and obstructive uropathy    Caregivers Participating in Plan of Care/Discharge Plan with the patient: spouse    Tentative dc plan: Home    Anticipated DME needs for discharge: na    PRESCREENING COMPLETED FOR SNF na      Does the patient have appropriate clothing available to be worn at discharge? yes      The patient and care participants are willing to travel na area for discharge facility.  The patient and plan of care participants have been provided with a list of all available Rehab Facilities or Home Health agencies as applicable. CM will follow up with a list of facilities or agencies that are offer acceptance.    CM has disclosed any financial interest that [facility] may have with any facility or agency.    Anticipated Discharge Date: 3/19 am    The plan of care and discharge plan has been discussed with [place], Jeanann Lewandowsky, MD and all other appropriate providers and adjusted per interdisciplinary team recommendations and in discussion with the patient and the patient designated Care Plan Participants.    Barriers to Healthcare Success/ Readmission Risk Factors:  Obstructive uropathy    Consults:  Palliative Care Consult Recommended: n  Transitional Care Clinic Referral: n  Transitional Nurse Navigator Referral: n  Oncology Navigator Referral: n  SW consulted: n  [facility] (formerly Environmental consultant) Consulted: n  Outside Hospital/Community Resources Referrals and Collaboration: n    Food/Nutrition Needs:   n                   Dietician Consulted: n    RRAT Score: Low Risk            11       Total Score        3 Has Seen PCP in Last 6 Months (Yes=3, No=0)    2 Married. Living with Significant Other. Assisted Living. LTAC. SNF. or   Rehab    5 Pt. Coverage (Medicare=5 , Medicaid, or Self-Pay=4)    1 Charlson Comorbidity Score (Age + Comorbid Conditions)        Criteria that do not apply:    Patient Length of Stay (>5 days = 3)    IP Visits Last 12 Months (1-3=4, 4=9, >4=11)  PCP: Vivianne Master, MD . How do you get to your doctor appointments?self    Specialists: na    Dialysis Unit: na    Pharmacy: [facility]. Are there any medications that you have trouble paying for? Any difficulty getting your medications?N    DME available at Home:na    Home O2 L Flow:  na            Home O2 Provider: na    Home Environment and Prior Level of Function: Lives at [address] [place] [address] @HOMEPHONE @. Lives with spouse and kids.  Multistory 3 steps into home.  Responsibilities at home include adls    Prior to admission open services: na    Home Health Agency-  Personal Care Agency-    Extended Emergency Contact Information  Primary Emergency Contact: Berlinger,Todd P  Address: [address]           [place], [place] [address] [place]  Home Phone: [phone]  Relation: Spouse     Transportation: spouse will transport home    Therapy Recommendations:    OT = n    PT = n    SLP =  n     RT Home O2 Evaluation =  n    Wound Care =  n    Case Management Assessment    ABUSE/NEGLECT SCREENING   Physical Abuse/Neglect: Denies   Sexual Abuse:  Denies   Sexual Abuse: Denies              PRIMARY DECISION MAKER                                   CARE MANAGEMENT INTERVENTIONS   Readmission Interview Completed: Not Applicable   PCP Verified by CM: Yes([facility])   Last Visit to PCP: (november)       Mode of Transport at Discharge: Other (see comment)(spouse)       Transition of Care Consult (CM Consult): Discharge Planning           MyChart Signup: No   Discharge Durable Medical Equipment: No   Physical Therapy Consult: No   Occupational Therapy Consult: No   Speech Therapy Consult: No   Current Support Network: Lives with Spouse, Family Lives Nearby   Reason for Referral: DCP Rounds   History Provided By: Patient, Medical Record   Patient Orientation: Alert and Oriented, Person, Place, Situation, Self   Cognition: Alert   Support System Response: Concerned, Cooperative   Previous Living Arrangement: Lives with Family Independent   Home Accessibility: Steps, Multi Level Home   Prior Functional Level: Independent in ADLs/IADLs   Current Functional Level: Independent in ADLs/IADLs       Can patient return to prior living arrangement: Yes   Ability to make needs known:: Good   Family able to assist with home care needs:: Yes   Pets: Dog, Cat   Needs help with pets while hospitalized: No       Types of Needs Identified: Treatment Education       Confirm Follow Up Transport: Family                  DISCHARGE LOCATION   Discharge Placement: Home

## 2018-04-22 NOTE — Progress Notes (Signed)
PAGER ID: [phone]   MESSAGE: Pt [name] [name], rm [id number], needs a new iv, attempted twice but failed, thank you, slina rn, [phone]

## 2018-04-22 NOTE — Progress Notes (Signed)
Pt voided unknown amount PVR 41 ml

## 2018-04-22 NOTE — Consults (Signed)
[address]                                                   [place], [place] [address]                                                      Phone: [phone]  Urology Consult Note             1. Obstructive uropathy    2. AKI (acute kidney injury) [facility])          Assessment & Plan   Assessment    [name] [name] is a 48 year old female:    2 mm obstructing stone right UVJ, moderate hydronephrosis.  - No Leukocytosis. UA with blood, no leuks, no nitrites   - Low grade temp with T max 100.2 (04/21/2018)    AKI  - creat trending up now 2.1>2.0  - Baseline creat around 1.0    Nonobstructing left nephrolithiasis, largest stone measures 5 mm.      Plan:  CT imaging and report reviewed, discussed patient with Dr. Louretta Parma. Per Dr. Louretta Parma, patient does not have a high fever, UA doesn't appear infected and white count is normal. He does not feel this is an infected stone. Per Dr. Louretta Parma he is not worried about patient's creat as it should improve with hydration. The only indication for stent at this time would be for pain control if patient unable to tolerate pain since she is not septic per Dr. Louretta Parma. After I spoke with patient, she would like to avoid surgery and try to pass this on her own.  Ok to eat today, NPO after midnight tonight until urology re-assess patient in am  Ucx pending, abx per primary  Trend Labs. If Creat continues to rise may need to consider JJ stent.  IVF, flomax, and pain control per primary- consider PO regimen to see if patient can tolerate  avoid nephrotoxic agents  If patient becomes septic or begins to have high fevers please make NPO and immediately contact urology  Follow Up arranged: NO  Following closely      [name] [name]  [facility]  Pager # [phone]      Available M-F 7:30- 5pm .  After 5pm and weekends please call answering service at [phone]    ADDENDUM:  Chart reviewed. I agree with the history,  physical, assessment and plan as documented above. Additionally, I have addended the history, physical, assessment and plan above (or below) as needed in order to reflect my additional findings.    -[name] Nicks, MD         Chief Complaint   Patient presents with   ??? Back Pain   ??? Flank Pain         HISTORY OF PRESENT ILLNESS:    [name] [name] is a 48 y.o. female who is seen in consultation as referred by Dr. Migdalia Dk  for Right ureteral stone, hydro, and AKI.  Patient has not been seen by a urologist and is not currently receiving urological care. Urological history is significant for none.      HPI: Patient originally  presented to the ER on 04/21/2018 with c/o right flank pain with nausea and vomiting x 1 day. She describes the pain as sharp, constant and radiating to her right abdomen. She denies any fevers or chills at home. No dysuria, gross hematuria, frequency or urgency. Labs were significant for WBC Normal, Creat elevated, UA +blood, nitrite neg, leuk neg.  CT was performed and showed 2 mm obstructing stone right UVJ, moderate hydronephrosis. Nonobstructing left nephrolithiasis, largest stone measures 5 mm. Patient is currently with low grade fever otherwise  VSS. Patient reports persistent right flank pain, nausea, and severe h/a. Moving makes the pain worse, morphine makes the pain better. She states she does not see a nephrologist, her PCP has been monitoring her kidneys closely 2/2 family hx of renal failure. 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     Location Right kidney  Duration days   Associated signs/symptoms Nausea and Vomiting  Modifying factors morphine  Severity moderate        No flowsheet data found.      Past Medical History:   Diagnosis Date   ??? Chronic kidney disease     stage 3   ??? Fibromyalgia    ??? Palate mass        Past Surgical History:   Procedure Laterality Date   ??? HX APPENDECTOMY     ??? HX CESAREAN SECTION     ??? HX HERNIA REPAIR      left   ??? HX OTHER SURGICAL      jaw surgery   ??? HX  OVARIAN CYST REMOVAL     ??? HX SEPTOPLASTY     ??? HX TUBAL LIGATION         Social History     Tobacco Use   ??? Smoking status: Never Smoker   ??? Smokeless tobacco: Never Used   Substance Use Topics   ??? Alcohol use: No   ??? Drug use: No       Allergies   Allergen Reactions   ??? Augmentin [Amoxicillin-Pot Clavulanate] Diarrhea   ??? Demerol [Meperidine] Hives and Nausea and Vomiting       Family History   Problem Relation Age of Onset   ??? Kidney Disease Mother    ??? Depression Mother    ??? Heart Disease Father        Current Facility-Administered Medications   Medication Dose Route Frequency Provider Last Rate Last Dose   ??? tamsulosin (FLOMAX) capsule 0.4 mg  0.4 mg Oral DAILY Jasarevic, Muhamed, MD       ??? baclofen (LIORESAL) tablet 10 mg  10 mg Oral TID Irving Burton, MD   10 mg at 04/21/18 2326   ??? buPROPion XL (WELLBUTRIN XL) tablet 300 mg  300 mg Oral 7am Jasarevic, Muhamed, MD       ??? fluticasone propionate (FLONASE) 50 mcg/actuation nasal spray 2 Spray  2 Spray Both Nostrils DAILY Jasarevic, Muhamed, MD       ??? dicyclomine (BENTYL) capsule 20 mg  20 mg Oral QHS Jasarevic, Muhamed, MD   20 mg at 04/21/18 2326   ??? montelukast (SINGULAIR) tablet 10 mg  10 mg Oral DAILY Irving Burton, MD       ??? topiramate (TOPAMAX) tablet 200 mg  200 mg Oral QHS Jasarevic, Muhamed, MD   200 mg at 04/21/18 2326   ??? sodium chloride (NS) flush 5-10 mL  5-10 mL IntraVENous Q8H Jasarevic, Muhamed, MD   10 mL at 04/21/18 2200   ???  sodium chloride (NS) flush 5-10 mL  5-10 mL IntraVENous PRN Irving Burton, MD       ??? naloxone (NARCAN) injection 0.1 mg  0.1 mg IntraVENous PRN Jasarevic, Muhamed, MD       ??? acetaminophen (TYLENOL) tablet 650 mg  650 mg Oral Q6H PRN Irving Burton, MD   650 mg at 04/22/18 0333   ??? 0.9% sodium chloride infusion  100 mL/hr IntraVENous CONTINUOUS Irving Burton, MD 100 mL/hr at 04/22/18 0701 100 mL/hr at 04/22/18 0701   ??? ondansetron (ZOFRAN) injection 4 mg  4 mg IntraVENous Q8H PRN Irving Burton, MD       ??? morphine injection 2 mg  2 mg IntraVENous Q4H PRN Irving Burton, MD   2 mg at 04/22/18 0701   ??? gabapentin (NEURONTIN) capsule 300 mg  300 mg Oral DAILY Irving Burton, MD       ??? gabapentin (NEURONTIN) capsule 800 mg  800 mg Oral QHS Jasarevic, Muhamed, MD   800 mg at 04/21/18 2346       Review of Systems    Pertinent items are noted in the History of Present Illness.    PHYSICAL EXAMINATION:   Visit Vitals  BP 113/54 (BP 1 Location: Right arm, BP Patient Position: Supine)   Pulse 69   Temp 99.1 ??F (37.3 ??C)   Resp 16   Ht 4' 11.5" (1.511 m)   Wt 110 lb (49.9 kg)   SpO2 98%   BMI 21.85 kg/m??       Constitutional: Well developed, well nourished female.  No acute distress.    HEENT: Normocephalic, Atraumatic  CV:  RRR   Pulm: No respiratory distress or difficulties breathing   GI:  Right abdominal pain to palpation, soft, non-distended  GU:  RIGHT CVA tenderness   Skin: No evidence of jaundice.  Normal color  Neuro/Psych:  Alert and oriented. Affect appropriate.   Lymphatic:    No inguinal lymphadenopathy  MSK: FROM         REVIEW OF LABS AND IMAGING:      CT abd/ pelvis w/o contrast 04/21/2018  IMPRESSION:  1. 2 mm obstructing stone right UVJ, moderate hydronephrosis.  2. Nonobstructing left nephrolithiasis, largest stone measures 5 mm.  3. Appendectomy and bilateral tubal ligation; IUD in satisfactory position.              Labs: Results:   Chemistry    Recent Labs     04/22/18  0325 04/21/18  1745   GLU 94 88   NA 142 141   K 4.2 3.1*   CL 115* 110*   CO2 22 26   BUN 16 18   CREA 2.1* 2.0*   CA 8.1* 8.9   AGAP 5 6   AP 63 83   TP 5.7* 7.5   ALB 2.8* 3.7      CBC w/Diff Recent Labs     04/22/18  0325 04/21/18  1745   WBC 7.6 10.5   RBC 2.95* 3.66   HGB 9.0* 10.9*   HCT 28.2* 33.9*   PLT 264 350   GRANS 71.9* 69.3*   LYMPH 24.5* 20.7*   EOS 0.5 0.3      Cultures No results for input(s): CULT in the last 72 hours.  All Micro Results     Procedure Component Value Units Date/Time    CULTURE,  URINE [[id number]] Collected:  04/21/18 2048    Order Status:  Completed Specimen:  Cath Urine Updated:  04/22/18 0732     Culture result No Growth To Date       CULTURE, URINE [[id number]] Collected:  04/21/18 1930    Order Status:  Canceled Specimen:  Urine from Clean catch             Urinalysis Color   Date Value Ref Range Status   04/21/2018 Yellow   Final     Appearance   Date Value Ref Range Status   04/21/2018 Clear   Final     Specific gravity   Date Value Ref Range Status   04/21/2018 1.015 1.005 - 1.030   Final     pH (UA)   Date Value Ref Range Status   04/21/2018 6.0 5 - 9   Final     Protein   Date Value Ref Range Status   04/21/2018 Negative NEGATIVE,Negative mg/dl Final     Ketone   Date Value Ref Range Status   04/21/2018 Negative NEGATIVE,Negative mg/dl Final     Bilirubin   Date Value Ref Range Status   04/21/2018 Negative NEGATIVE,Negative   Final     Blood   Date Value Ref Range Status   04/21/2018 Moderate (A) NEGATIVE,Negative   Final     Comment:     Operator: [id number]     Urobilinogen   Date Value Ref Range Status   04/21/2018 0.2 0.0 - 1.0 EU/dl Final     Nitrites   Date Value Ref Range Status   04/21/2018 Negative NEGATIVE,Negative   Final     Leukocyte Esterase   Date Value Ref Range Status   04/21/2018 Negative NEGATIVE,Negative   Final     Potassium   Date Value Ref Range Status   04/22/2018 4.2 3.5 - 5.1 mEq/L Final     Creatinine   Date Value Ref Range Status   04/22/2018 2.1 (H) 0.6 - 1.3 mg/dl Final     BUN   Date Value Ref Range Status   04/22/2018 16 7 - 25 mg/dl Final      PSA No results for input(s): PSA in the last 72 hours.   Coagulation Lab Results   Component Value Date/Time    Prothrombin time 12.7 08/26/2017 03:24 PM    Prothrombin time 13.3 01/10/2012 09:00 AM    INR 1.1 08/26/2017 03:24 PM    INR 1.0 01/10/2012 09:00 AM    aPTT 34.6 08/26/2017 03:24 PM    aPTT 34.8 01/10/2012 09:00 AM

## 2018-04-22 NOTE — Progress Notes (Signed)
Pt stated she felt warm temp taken 98.4

## 2018-04-22 NOTE — Progress Notes (Signed)
Bedside shift change report given toWendy RN (oncoming nurse) by Linton Rump RN (offgoing nurse). Report included the following information SBAR, Kardex, MAR and Recent Results.

## 2018-04-22 NOTE — Progress Notes (Signed)
Pt voided unknown amount PVR 41 ml

## 2018-04-22 NOTE — Progress Notes (Signed)
Pt stated she felt warm temp taken 98.4

## 2018-04-22 NOTE — Consults (Addendum)
[address]                                                   [place], [place] [address]                                                      Phone: [phone]  Urology Consult Note             1. Obstructive uropathy    2. AKI (acute kidney injury) [facility])          Assessment & Plan   Assessment    [name] [name] is a 48 year old female:    2 mm obstructing stone right UVJ, moderate hydronephrosis.  - No Leukocytosis. UA with blood, no leuks, no nitrites   - Low grade temp with T max 100.2 (04/21/2018)    AKI  - creat trending up now 2.1>2.0  - Baseline creat around 1.0    Nonobstructing left nephrolithiasis, largest stone measures 5 mm.      Plan:  CT imaging and report reviewed, discussed patient with Dr. Louretta Parma. Per Dr. Louretta Parma, patient does not have a high fever, UA doesn't appear infected and white count is normal. He does not feel this is an infected stone. Per Dr. Louretta Parma he is not worried about patient's creat as it should improve with hydration. The only indication for stent at this time would be for pain control if patient unable to tolerate pain since she is not septic per Dr. Louretta Parma. After I spoke with patient, she would like to avoid surgery and try to pass this on her own.  Ok to eat today, NPO after midnight tonight until urology re-assess patient in am  Ucx pending, abx per primary  Trend Labs. If Creat continues to rise may need to consider JJ stent.  IVF, flomax, and pain control per primary- consider PO regimen to see if patient can tolerate  avoid nephrotoxic agents  If patient becomes septic or begins to have high fevers please make NPO and immediately contact urology  Follow Up arranged: NO  Following closely      [name]  [facility]  Pager # [phone]      Available M-F 7:30- 5pm .  After 5pm and weekends please call answering service at [phone]    ADDENDUM:   Chart reviewed. I agree with the history, physical, assessment and plan as documented above. Additionally, I have addended the history, physical, assessment and plan above (or below) as needed in order to reflect my additional findings.    -[name], MD         Chief Complaint   Patient presents with   ??? Back Pain   ??? Flank Pain         HISTORY OF PRESENT ILLNESS:    [name] [name] is a 48 y.o. female who is seen in consultation as referred by Dr. Migdalia Dk  for Right ureteral stone, hydro, and AKI.  Patient has not been seen by a urologist and is not currently receiving urological care. Urological history is significant for none.      HPI: Patient originally  presented to the ER on 04/21/2018 with c/o right flank pain with nausea and vomiting x 1 day. She describes the pain as sharp, constant and radiating to her right abdomen. She denies any fevers or chills at home. No dysuria, gross hematuria, frequency or urgency. Labs were significant for WBC Normal, Creat elevated, UA +blood, nitrite neg, leuk neg.  CT was performed and showed 2 mm obstructing stone right UVJ, moderate hydronephrosis. Nonobstructing left nephrolithiasis, largest stone measures 5 mm. Patient is currently with low grade fever otherwise  VSS. Patient reports persistent right flank pain, nausea, and severe h/a. Moving makes the pain worse, morphine makes the pain better. She states she does not see a nephrologist, her PCP has been monitoring her kidneys closely 2/2 family hx of renal failure. 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     Location Right kidney  Duration days   Associated signs/symptoms Nausea and Vomiting  Modifying factors morphine  Severity moderate        No flowsheet data found.      Past Medical History:   Diagnosis Date   ??? Chronic kidney disease     stage 3   ??? Fibromyalgia    ??? Palate mass        Past Surgical History:   Procedure Laterality Date   ??? HX APPENDECTOMY     ??? HX CESAREAN SECTION     ??? HX HERNIA REPAIR      left    ??? HX OTHER SURGICAL      jaw surgery   ??? HX OVARIAN CYST REMOVAL     ??? HX SEPTOPLASTY     ??? HX TUBAL LIGATION         Social History     Tobacco Use   ??? Smoking status: Never Smoker   ??? Smokeless tobacco: Never Used   Substance Use Topics   ??? Alcohol use: No   ??? Drug use: No       Allergies   Allergen Reactions   ??? Augmentin [Amoxicillin-Pot Clavulanate] Diarrhea   ??? Demerol [Meperidine] Hives and Nausea and Vomiting       Family History   Problem Relation Age of Onset   ??? Kidney Disease Mother    ??? Depression Mother    ??? Heart Disease Father        Current Facility-Administered Medications   Medication Dose Route Frequency Provider Last Rate Last Dose   ??? tamsulosin (FLOMAX) capsule 0.4 mg  0.4 mg Oral DAILY Jasarevic, Muhamed, MD       ??? baclofen (LIORESAL) tablet 10 mg  10 mg Oral TID Irving Burton, MD   10 mg at 04/21/18 2326   ??? buPROPion XL (WELLBUTRIN XL) tablet 300 mg  300 mg Oral 7am Jasarevic, Muhamed, MD       ??? fluticasone propionate (FLONASE) 50 mcg/actuation nasal spray 2 Spray  2 Spray Both Nostrils DAILY Jasarevic, Muhamed, MD       ??? dicyclomine (BENTYL) capsule 20 mg  20 mg Oral QHS Jasarevic, Muhamed, MD   20 mg at 04/21/18 2326   ??? montelukast (SINGULAIR) tablet 10 mg  10 mg Oral DAILY Irving Burton, MD       ??? topiramate (TOPAMAX) tablet 200 mg  200 mg Oral QHS Jasarevic, Muhamed, MD   200 mg at 04/21/18 2326   ??? sodium chloride (NS) flush 5-10 mL  5-10 mL IntraVENous Q8H Jasarevic, Muhamed, MD   10 mL at 04/21/18 2200   ???  sodium chloride (NS) flush 5-10 mL  5-10 mL IntraVENous PRN Irving Burton, MD       ??? naloxone (NARCAN) injection 0.1 mg  0.1 mg IntraVENous PRN Jasarevic, Muhamed, MD       ??? acetaminophen (TYLENOL) tablet 650 mg  650 mg Oral Q6H PRN Irving Burton, MD   650 mg at 04/22/18 0333   ??? 0.9% sodium chloride infusion  100 mL/hr IntraVENous CONTINUOUS Irving Burton, MD 100 mL/hr at 04/22/18 0701 100 mL/hr at 04/22/18 0701    ??? ondansetron (ZOFRAN) injection 4 mg  4 mg IntraVENous Q8H PRN Irving Burton, MD       ??? morphine injection 2 mg  2 mg IntraVENous Q4H PRN Irving Burton, MD   2 mg at 04/22/18 0701   ??? gabapentin (NEURONTIN) capsule 300 mg  300 mg Oral DAILY Irving Burton, MD       ??? gabapentin (NEURONTIN) capsule 800 mg  800 mg Oral QHS Jasarevic, Muhamed, MD   800 mg at 04/21/18 2346       Review of Systems    Pertinent items are noted in the History of Present Illness.    PHYSICAL EXAMINATION:   Visit Vitals  BP 113/54 (BP 1 Location: Right arm, BP Patient Position: Supine)   Pulse 69   Temp 99.1 ??F (37.3 ??C)   Resp 16   Ht 4' 11.5" (1.511 m)   Wt 110 lb (49.9 kg)   SpO2 98%   BMI 21.85 kg/m??       Constitutional: Well developed, well nourished female.  No acute distress.    HEENT: Normocephalic, Atraumatic  CV:  RRR   Pulm: No respiratory distress or difficulties breathing   GI:  Right abdominal pain to palpation, soft, non-distended  GU:  RIGHT CVA tenderness   Skin: No evidence of jaundice.  Normal color  Neuro/Psych:  Alert and oriented. Affect appropriate.   Lymphatic:    No inguinal lymphadenopathy  MSK: FROM         REVIEW OF LABS AND IMAGING:      CT abd/ pelvis w/o contrast 04/21/2018  IMPRESSION:  1. 2 mm obstructing stone right UVJ, moderate hydronephrosis.  2. Nonobstructing left nephrolithiasis, largest stone measures 5 mm.  3. Appendectomy and bilateral tubal ligation; IUD in satisfactory position.              Labs: Results:   Chemistry    Recent Labs     04/22/18  0325 04/21/18  1745   GLU 94 88   NA 142 141   K 4.2 3.1*   CL 115* 110*   CO2 22 26   BUN 16 18   CREA 2.1* 2.0*   CA 8.1* 8.9   AGAP 5 6   AP 63 83   TP 5.7* 7.5   ALB 2.8* 3.7      CBC w/Diff Recent Labs     04/22/18  0325 04/21/18  1745   WBC 7.6 10.5   RBC 2.95* 3.66   HGB 9.0* 10.9*   HCT 28.2* 33.9*   PLT 264 350   GRANS 71.9* 69.3*   LYMPH 24.5* 20.7*   EOS 0.5 0.3      Cultures No results for input(s): CULT in the last 72 hours.   All Micro Results     Procedure Component Value Units Date/Time    CULTURE, URINE [[id number]] Collected:  04/21/18 2048    Order Status:  Completed Specimen:  Cath Urine Updated:  04/22/18 0732     Culture result No Growth To Date       CULTURE, URINE [[id number]] Collected:  04/21/18 1930    Order Status:  Canceled Specimen:  Urine from Clean catch             Urinalysis Color   Date Value Ref Range Status   04/21/2018 Yellow   Final     Appearance   Date Value Ref Range Status   04/21/2018 Clear   Final     Specific gravity   Date Value Ref Range Status   04/21/2018 1.015 1.005 - 1.030   Final     pH (UA)   Date Value Ref Range Status   04/21/2018 6.0 5 - 9   Final     Protein   Date Value Ref Range Status   04/21/2018 Negative NEGATIVE,Negative mg/dl Final     Ketone   Date Value Ref Range Status   04/21/2018 Negative NEGATIVE,Negative mg/dl Final     Bilirubin   Date Value Ref Range Status   04/21/2018 Negative NEGATIVE,Negative   Final     Blood   Date Value Ref Range Status   04/21/2018 Moderate (A) NEGATIVE,Negative   Final     Comment:     Operator: [id number]     Urobilinogen   Date Value Ref Range Status   04/21/2018 0.2 0.0 - 1.0 EU/dl Final     Nitrites   Date Value Ref Range Status   04/21/2018 Negative NEGATIVE,Negative   Final     Leukocyte Esterase   Date Value Ref Range Status   04/21/2018 Negative NEGATIVE,Negative   Final     Potassium   Date Value Ref Range Status   04/22/2018 4.2 3.5 - 5.1 mEq/L Final     Creatinine   Date Value Ref Range Status   04/22/2018 2.1 (H) 0.6 - 1.3 mg/dl Final     BUN   Date Value Ref Range Status   04/22/2018 16 7 - 25 mg/dl Final      PSA No results for input(s): PSA in the last 72 hours.   Coagulation Lab Results   Component Value Date/Time    Prothrombin time 12.7 08/26/2017 03:24 PM    Prothrombin time 13.3 01/10/2012 09:00 AM    INR 1.1 08/26/2017 03:24 PM    INR 1.0 01/10/2012 09:00 AM    aPTT 34.6 08/26/2017 03:24 PM    aPTT 34.8 01/10/2012 09:00 AM

## 2018-04-22 NOTE — Progress Notes (Signed)
Pt voided 175ml and PVR 62ml.

## 2018-04-22 NOTE — Progress Notes (Signed)
PAGER ID: [phone]   MESSAGE: Pt [name] [name], rm [id number], needs a new iv, attempted twice but failed, thank you, slina rn, [phone]

## 2018-04-22 NOTE — Progress Notes (Signed)
Patient admitted on 04/21/2018 from ED from home with   Chief Complaint   Patient presents with   ??? Back Pain   ??? Flank Pain          The patient is being treated for    PMH:   Past Medical History:   Diagnosis Date   ??? Chronic kidney disease     stage 3   ??? Fibromyalgia    ??? Palate mass         Treatment Team: Treatment Team: Attending Provider: Shanon Rosser, MD; Consulting Provider: Norva Pavlov, MD; Consulting Provider: Irving Burton, MD; Consulting Provider: Gevena Mart, MD; Hospitalist: Shanon Rosser, MD; Primary Nurse: Enos Fling, RN; Care Manager: Candace Cruise      The patient has been admitted to the hospital 0 times in the past 12 months.    Previous 4 Admission Dates Admission and Discharge Diagnosis Interventions Barriers Disposition                                 Patient and Family/Caregivers Goals of Care: tx pain and obstructive uropathy    Caregivers Participating in Plan of Care/Discharge Plan with the patient: spouse    Tentative dc plan: Home    Anticipated DME needs for discharge: na    PRESCREENING COMPLETED FOR SNF na      Does the patient have appropriate clothing available to be worn at discharge? yes      The patient and care participants are willing to travel na area for discharge facility.  The patient and plan of care participants have been provided with a list of all available Rehab Facilities or Home Health agencies as applicable. CM will follow up with a list of facilities or agencies that are offer acceptance.    CM has disclosed any financial interest that [facility] may have with any facility or agency.    Anticipated Discharge Date: 3/19 am    The plan of care and discharge plan has been discussed with [place], Jeanann Lewandowsky, MD and all other appropriate providers and adjusted per interdisciplinary team recommendations and in discussion with the patient and the patient designated Care Plan Participants.     Barriers to Healthcare Success/ Readmission Risk Factors: Obstructive uropathy    Consults:  Palliative Care Consult Recommended: n  Transitional Care Clinic Referral: n  Transitional Nurse Navigator Referral: n  Oncology Navigator Referral: n  SW consulted: n  [employer] (formerly Environmental consultant) Consulted: n  Outside Hospital/Community Resources Referrals and Collaboration: n    Food/Nutrition Needs:   n                   Dietician Consulted: n    RRAT Score: Low Risk            11       Total Score        3 Has Seen PCP in Last 6 Months (Yes=3, No=0)    2 Married. Living with Significant Other. Assisted Living. LTAC. SNF. or   Rehab    5 Pt. Coverage (Medicare=5 , Medicaid, or Self-Pay=4)    1 Charlson Comorbidity Score (Age + Comorbid Conditions)        Criteria that do not apply:    Patient Length of Stay (>5 days = 3)    IP Visits Last 12 Months (1-3=4, 4=9, >4=11)  PCP: Vivianne Master, MD . How do you get to your doctor appointments?self    Specialists: na    Dialysis Unit: na    Pharmacy: [facility]. Are there any medications that you have trouble paying for? Any difficulty getting your medications?N    DME available at Home:na    Home O2 L Flow:  na            Home O2 Provider: na    Home Environment and Prior Level of Function: Lives at [address] [place] [address] @HOMEPHONE @. Lives with spouse and kids.  Multistory 3 steps into home.  Responsibilities at home include adls    Prior to admission open services: na    Home Health Agency-  Personal Care Agency-    Extended Emergency Contact Information  Primary Emergency Contact: Coggins,Todd P  Address: [address]           [place], [place] [address] [place]  Home Phone: [phone]  Relation: Spouse     Transportation: spouse will transport home    Therapy Recommendations:    OT = n    PT = n    SLP =  n     RT Home O2 Evaluation =  n    Wound Care =  n    Case Management Assessment     ABUSE/NEGLECT SCREENING   Physical Abuse/Neglect: Denies   Sexual Abuse: Denies   Sexual Abuse: Denies              PRIMARY DECISION MAKER                                   CARE MANAGEMENT INTERVENTIONS   Readmission Interview Completed: Not Applicable   PCP Verified by CM: Yes([facility])   Last Visit to PCP: (november)       Mode of Transport at Discharge: Other (see comment)(spouse)       Transition of Care Consult (CM Consult): Discharge Planning           MyChart Signup: No   Discharge Durable Medical Equipment: No   Physical Therapy Consult: No   Occupational Therapy Consult: No   Speech Therapy Consult: No   Current Support Network: Lives with Spouse, Family Lives Nearby   Reason for Referral: DCP Rounds   History Provided By: Patient, Medical Record   Patient Orientation: Alert and Oriented, Person, Place, Situation, Self   Cognition: Alert   Support System Response: Concerned, Cooperative   Previous Living Arrangement: Lives with Family Independent   Home Accessibility: Steps, Multi Level Home   Prior Functional Level: Independent in ADLs/IADLs   Current Functional Level: Independent in ADLs/IADLs       Can patient return to prior living arrangement: Yes   Ability to make needs known:: Good   Family able to assist with home care needs:: Yes   Pets: Dog, Cat   Needs help with pets while hospitalized: No       Types of Needs Identified: Treatment Education       Confirm Follow Up Transport: Family                  DISCHARGE LOCATION   Discharge Placement: Home

## 2018-04-22 NOTE — Progress Notes (Addendum)
INTERNAL MEDICINE PROGRESS NOTE  Patient: [name] [name]   Date of Birth: 1971-01-14   MRN: [id number]        Assessment      Principle Problems:  Ureterolithiasis, 2 mm obstructing stone right UVJ, moderate hydronephrosis  Abdominal pain , Nausea and Vomiting - due to the above   Hypokalemia : mild , corrected   Acute renal insufficiency   - presented with complain of abd pain, N/V, on admission afebrile, vitals stable, WBC normal, Hgb 9, Cr 2.1, UA showed hematuria, LFT and lipase normal   - CT : 2 mm obstructing stone right UVJ, moderate hydronephrosis., Nonobstructing left nephrolithiasis, largest stone measures 5 mm.  - admitted to med surg, urine culture, IVF support, pain control, urology consulted , flomax added        Active Problems:  Chronic anemia - stable   Depression   Allergy   BPH  Fibromyalgia     Code Status: Full code   DVT prophylaxis: pharmacologic and mechanical  Disposition: home     Today Plan:   Still having pain and nausea   Continue with IVF support  Monitor renal function  Antinausea   Monitor urine culture, Abx if positive   Urology recommendations - she is NPO       Subjective:   Patient states she still in severe pain and having nausea  , no fever     Medical Decision Making   Chart, Images and Lab data reviewed, necessary medical Orders placed   Discussed with nursing staff     Vitals:    04/21/18 2242 04/21/18 2328 04/22/18 0449 04/22/18 0737   BP: 108/60 96/51 99/54  113/54   Pulse: 85 80 68 69   Resp: 20 20 20 16    Temp: 100.2 ??F (37.9 ??C) 98.6 ??F (37 ??C) 99.7 ??F (37.6 ??C) 99.1 ??F (37.3 ??C)   SpO2: 99% 100% 100% 98%   Weight:       Height:         Temp (24hrs), Avg:99.2 ??F (37.3 ??C), Min:98.6 ??F (37 ??C), Max:100.2 ??F (37.9 ??C)      Intake/Output Summary (Last 24 hours) at 04/22/2018 0908  Last data filed at 04/22/2018 0615  Gross per 24 hour   Intake 1760 ml   Output 250 ml   Net 1510 ml       Physical Exam:   General Appearance:   Appears in no acute distress.,   Skin:   Skin warm & dry, No rash, No jaundice,   Lymph:  There is no lymphadenopathy,   HEENT:   PERRLA, EOMI, Moist oral mucous membranes, conjunctiva clear,   Neck:   Supple, Without masses,   Lungs:   Clear, No wheezes., No rales., Normal respiratory effort,   Heart:   Regular rate and rhythm, No gallop,   Abdomen:   Soft , Non-distended, Normal bowel sounds and tender Rt angle   Extremities:    edema of legs, Normal pedal and radial pulses,   Neuro:   alert, oriented, affect appropriate, speech fluent, cranial nerves intact, no focal neurological deficits and moves all extremities well    Current medications:     Current Facility-Administered Medications   Medication Dose Route Frequency   ??? tamsulosin (FLOMAX) capsule 0.4 mg  0.4 mg Oral DAILY   ??? baclofen (LIORESAL) tablet 10 mg  10 mg Oral TID   ??? buPROPion XL (WELLBUTRIN XL) tablet 300 mg  300 mg Oral 7am   ???  fluticasone propionate (FLONASE) 50 mcg/actuation nasal spray 2 Spray  2 Spray Both Nostrils DAILY   ??? dicyclomine (BENTYL) capsule 20 mg  20 mg Oral QHS   ??? montelukast (SINGULAIR) tablet 10 mg  10 mg Oral DAILY   ??? topiramate (TOPAMAX) tablet 200 mg  200 mg Oral QHS   ??? sodium chloride (NS) flush 5-10 mL  5-10 mL IntraVENous Q8H   ??? sodium chloride (NS) flush 5-10 mL  5-10 mL IntraVENous PRN   ??? naloxone (NARCAN) injection 0.1 mg  0.1 mg IntraVENous PRN   ??? acetaminophen (TYLENOL) tablet 650 mg  650 mg Oral Q6H PRN   ??? 0.9% sodium chloride infusion  100 mL/hr IntraVENous CONTINUOUS   ??? ondansetron (ZOFRAN) injection 4 mg  4 mg IntraVENous Q8H PRN   ??? morphine injection 2 mg  2 mg IntraVENous Q4H PRN   ??? gabapentin (NEURONTIN) capsule 300 mg  300 mg Oral DAILY   ??? gabapentin (NEURONTIN) capsule 800 mg  800 mg Oral QHS          Laboratory and Radiology Data :     Recent Results (from the past 24 hour(s))   CBC WITH AUTOMATED DIFF    Collection Time: 04/21/18  5:45 PM   Result Value Ref Range    WBC 10.5 4.0 - 11.0 1000/mm3    RBC 3.66 3.60 - 5.20 M/uL     HGB 10.9 (L) 13.0 - 17.2 gm/dl    HCT 33.9 (L) 37.0 - 50.0 %    MCV 92.6 80.0 - 98.0 fL    MCH 29.8 25.4 - 34.6 pg    MCHC 32.2 30.0 - 36.0 gm/dl    PLATELET 350 140 - 450 1000/mm3    MPV 10.1 (H) 6.0 - 10.0 fL    RDW-SD 44.7 36.4 - 46.3      NRBC 0 0 - 0      IMMATURE GRANULOCYTES 0.5 0.0 - 3.0 %    NEUTROPHILS 69.3 (H) 34 - 64 %    LYMPHOCYTES 20.7 (L) 28 - 48 %    MONOCYTES 8.8 1 - 13 %    EOSINOPHILS 0.3 0 - 5 %    BASOPHILS 0.4 0 - 3 %   METABOLIC PANEL, COMPREHENSIVE    Collection Time: 04/21/18  5:45 PM   Result Value Ref Range    Sodium 141 136 - 145 mEq/L    Potassium 3.1 (L) 3.5 - 5.1 mEq/L    Chloride 110 (H) 98 - 107 mEq/L    CO2 26 21 - 32 mEq/L    Glucose 88 74 - 106 mg/dl    BUN 18 7 - 25 mg/dl    Creatinine 2.0 (H) 0.6 - 1.3 mg/dl    GFR est AA 34.0      GFR est non-AA 28      Calcium 8.9 8.5 - 10.1 mg/dl    AST (SGOT) 13 (L) 15 - 37 U/L    ALT (SGPT) 22 12 - 78 U/L    Alk. phosphatase 83 45 - 117 U/L    Bilirubin, total 0.5 0.2 - 1.0 mg/dl    Protein, total 7.5 6.4 - 8.2 gm/dl    Albumin 3.7 3.4 - 5.0 gm/dl    Anion gap 6 5 - 15 mmol/L   LIPASE    Collection Time: 04/21/18  5:45 PM   Result Value Ref Range    Lipase 164 73 - 393 U/L   POC HCG,URINE    Collection  Time: 04/21/18  5:47 PM   Result Value Ref Range    HCG urine, QL negative NEGATIVE,Negative,negative     POC URINE MACROSCOPIC    Collection Time: 04/21/18  5:48 PM   Result Value Ref Range    Glucose Negative NEGATIVE,Negative mg/dl    Bilirubin Negative NEGATIVE,Negative      Ketone Negative NEGATIVE,Negative mg/dl    Specific gravity 1.020 1.005 - 1.030      Blood Trace-intact (A) NEGATIVE,Negative      pH (UA) 7.0 5 - 9      Protein Negative NEGATIVE,Negative mg/dl    Urobilinogen 0.2 0.0 - 1.0 EU/dl    Nitrites Negative NEGATIVE,Negative      Leukocyte Esterase Small (A) NEGATIVE,Negative      Color Yellow      Appearance Clear     POC URINE MICROSCOPIC    Collection Time: 04/21/18  5:48 PM   Result Value Ref Range     Epithelial cells, squamous 10-14 /LPF    WBC OCCASIONAL /HPF    RBC OCCASIONAL /HPF    Bacteria OCCASIONAL /HPF   CULTURE, URINE    Collection Time: 04/21/18  8:48 PM   Result Value Ref Range    Culture result No Growth To Date     POC URINE MACROSCOPIC    Collection Time: 04/21/18  8:56 PM   Result Value Ref Range    Glucose Negative NEGATIVE,Negative mg/dl    Bilirubin Negative NEGATIVE,Negative      Ketone Negative NEGATIVE,Negative mg/dl    Specific gravity 1.015 1.005 - 1.030      Blood Moderate (A) NEGATIVE,Negative      pH (UA) 6.0 5 - 9      Protein Negative NEGATIVE,Negative mg/dl    Urobilinogen 0.2 0.0 - 1.0 EU/dl    Nitrites Negative NEGATIVE,Negative      Leukocyte Esterase Negative NEGATIVE,Negative      Color Yellow      Appearance Clear     CBC WITH AUTOMATED DIFF    Collection Time: 04/22/18  3:25 AM   Result Value Ref Range    WBC 7.6 4.0 - 11.0 1000/mm3    RBC 2.95 (L) 3.60 - 5.20 M/uL    HGB 9.0 (L) 13.0 - 17.2 gm/dl    HCT 28.2 (L) 37.0 - 50.0 %    MCV 95.6 80.0 - 98.0 fL    MCH 30.5 25.4 - 34.6 pg    MCHC 31.9 30.0 - 36.0 gm/dl    PLATELET 264 140 - 450 1000/mm3    MPV 10.2 (H) 6.0 - 10.0 fL    RDW-SD 45.1 36.4 - 46.3      NRBC 0 0 - 0      IMMATURE GRANULOCYTES 0.1 0.0 - 3.0 %    NEUTROPHILS 71.9 (H) 34 - 64 %    LYMPHOCYTES 24.5 (L) 28 - 48 %    MONOCYTES 2.5 1 - 13 %    EOSINOPHILS 0.5 0 - 5 %    BASOPHILS 0.5 0 - 3 %   METABOLIC PANEL, COMPREHENSIVE    Collection Time: 04/22/18  3:25 AM   Result Value Ref Range    Sodium 142 136 - 145 mEq/L    Potassium 4.2 3.5 - 5.1 mEq/L    Chloride 115 (H) 98 - 107 mEq/L    CO2 22 21 - 32 mEq/L    Glucose 94 74 - 106 mg/dl    BUN 16 7 - 25 mg/dl  Creatinine 2.1 (H) 0.6 - 1.3 mg/dl    GFR est AA 32.0      GFR est non-AA 27      Calcium 8.1 (L) 8.5 - 10.1 mg/dl    AST (SGOT) 17 15 - 37 U/L    ALT (SGPT) 18 12 - 78 U/L    Alk. phosphatase 63 45 - 117 U/L    Bilirubin, total 0.5 0.2 - 1.0 mg/dl    Protein, total 5.7 (L) 6.4 - 8.2 gm/dl     Albumin 2.8 (L) 3.4 - 5.0 gm/dl    Anion gap 5 5 - 15 mmol/L       XR Results:  Results from Hospital Encounter encounter on 01/09/18   XR HAND RT MIN 3 V    Narrative INDICATION:  Pain        EXAMINATION:  XR HAND RT MIN 3 V    COMPARISON:  None    FINDINGS:  There is no fracture or dislocation. There is foreshortening of the third fourth  and fifth metacarpals.      Impression IMPRESSION:  1. Negative for fracture.  2. Foreshortening of the third through fifth metacarpals.         CT Results:  Results from [facility] encounter on 04/21/18   CT ABD PELV WO CONT    Narrative DICOM format image data is available to non-affiliated external healthcare  facilities or entities on a secure, media free, reciprocally searchable basis  with patient authorization for 12 months following the date of the study.    Clinical history: Right-sided flank and abdominal pain    EXAMINATION:  Noncontrast CT scan abdomen and pelvis 04/21/2018. 5 mm spiral scanning is  performed from the costophrenic angles to the symphysis pubis. Coronal and  sagittal reconstruction imaging has been obtained.    Correlation: 02/17/2016    FINDINGS:  Lung bases are clear. Mild degenerative changes of the spine liver, gallbladder,  spleen, pancreas and adrenal glands are unremarkable.    2 mm obstructing stone right UVJ, moderate right hydronephrosis and  perirenal/periureteric fat stranding. Nonobstructing left nephrolithiasis,  largest stone measures 5 mm.    Bladder is unremarkable. Intrauterine contraceptive device is in satisfactory  position. Bilateral tubal ligation clips. Appendix is not seen. Colon and  terminal ileum are unremarkable.    Stomach and small bowel bowel loops are within normal limits. Abdominal aorta is  unremarkable. No dominant lymph node enlargement. Small amounts of free fluid.      Impression IMPRESSION:  1. 2 mm obstructing stone right UVJ, moderate hydronephrosis.   2. Nonobstructing left nephrolithiasis, largest stone measures 5 mm.  3. Appendectomy and bilateral tubal ligation; IUD in satisfactory position.         MRI Results:  Results from [facility] encounter on 05/27/14   MRI BRAIN WO CONT    Narrative MRI brain without contrast    Indication: Multiple sclerosis    Comparison: 07/09/2010    Technique: Multiplanar, multisequence MR images of the brain were obtained  without contrast.     Findings:  Several subcentimeter foci increased T2 / FLAIR signal in the periventricular  and subcortical white matter as can be seen with provided history of  demyelinating disease appear similar to prior exam.  No new lesion seen.  No  infratentorial lesions. No restricted diffusion.    No abnormal intra or extra-axial fluid collections, midline shift, chronic  hemorrhage, or hydrocephalus.     No restricted diffusion or evidence  for acute ischemia    Mucosal thickening noted in both maxillary and sphenoid sinuses with post  surgical changes similar to prior exam. Mastoid air cells are clear.       Flow-voids are maintained.      Impression IMPRESSION: Stable white matter lesions.         Nuclear Medicine Results:  Results from [facility] encounter on 05/16/14   TRANSCRIBED NUCLEAR MEDICINE       [place] Results:  No results found for this or any previous visit.    IR Results:  No results found for this or any previous visit.    VAS/US Results:  Results from Hospital Encounter encounter on 02/17/16   DUPLEX ABD VISC ART/VEN/ORGANS COMPLETE               Christy Sartorius M.D.  [facility]  Page [phone]

## 2018-04-22 NOTE — Progress Notes (Signed)
Bedside shift change report given toWendy RN (oncoming nurse) by Amy RN (offgoing nurse). Report included the following information SBAR, Kardex, MAR and Recent Results.

## 2018-04-22 NOTE — Progress Notes (Signed)
Pt voided 200 ml PVR 33 ml

## 2018-04-23 ENCOUNTER — Observation Stay: Admit: 2018-04-23 | Payer: MEDICARE | Primary: Geriatric Medicine

## 2018-04-23 LAB — BASIC METABOLIC PANEL
Anion Gap: 7 mmol/L (ref 5–15)
Anion Gap: 6 mmol/L (ref 5–15)
BUN: 9 mg/dl (ref 7–25)
BUN: 10 mg/dl (ref 7–25)
CO2: 22 mEq/L (ref 21–32)
CO2: 24 mEq/L (ref 21–32)
Calcium: 8.7 mg/dl (ref 8.5–10.1)
Calcium: 9.2 mg/dl (ref 8.5–10.1)
Chloride: 114 mEq/L — ABNORMAL HIGH (ref 98–107)
Chloride: 114 mEq/L — ABNORMAL HIGH (ref 98–107)
Creatinine: 1.6 mg/dl — ABNORMAL HIGH (ref 0.6–1.3)
Creatinine: 1.5 mg/dl — ABNORMAL HIGH (ref 0.6–1.3)
EGFR IF NonAfrican American: 36
EGFR IF NonAfrican American: 39
GFR African American: 44
GFR African American: 47
Glucose: 99 mg/dl (ref 74–106)
Glucose: 95 mg/dl (ref 74–106)
Potassium: 3.9 mEq/L (ref 3.5–5.1)
Potassium: 3.7 mEq/L (ref 3.5–5.1)
Sodium: 143 mEq/L (ref 136–145)
Sodium: 144 mEq/L (ref 136–145)

## 2018-04-23 LAB — CBC WITH AUTO DIFFERENTIAL
Basophils %: 0.5 % (ref 0–3)
Eosinophils %: 0.5 % (ref 0–5)
Hematocrit: 30.9 % — ABNORMAL LOW (ref 37.0–50.0)
Hemoglobin: 10.1 gm/dl — ABNORMAL LOW (ref 13.0–17.2)
Immature Granulocytes: 0.3 % (ref 0.0–3.0)
Lymphocytes %: 17.9 % — ABNORMAL LOW (ref 28–48)
MCH: 30.1 pg (ref 25.4–34.6)
MCHC: 32.7 gm/dl (ref 30.0–36.0)
MCV: 92.2 fL (ref 80.0–98.0)
MPV: 10.6 fL — ABNORMAL HIGH (ref 6.0–10.0)
Monocytes %: 8 % (ref 1–13)
Neutrophils %: 72.8 % — ABNORMAL HIGH (ref 34–64)
Nucleated RBCs: 0 (ref 0–0)
Platelets: 270 10*3/uL (ref 140–450)
RBC: 3.35 M/uL — ABNORMAL LOW (ref 3.60–5.20)
RDW-SD: 44.1 (ref 36.4–46.3)
WBC: 9.6 10*3/uL (ref 4.0–11.0)

## 2018-04-23 LAB — CBC
Hematocrit: 28 % — ABNORMAL LOW (ref 37.0–50.0)
Hemoglobin: 9 gm/dl — ABNORMAL LOW (ref 13.0–17.2)
MCH: 30.1 pg (ref 25.4–34.6)
MCHC: 32.1 gm/dl (ref 30.0–36.0)
MCV: 93.6 fL (ref 80.0–98.0)
MPV: 10.9 fL — ABNORMAL HIGH (ref 6.0–10.0)
Platelets: 241 10*3/uL (ref 140–450)
RBC: 2.99 M/uL — ABNORMAL LOW (ref 3.60–5.20)
RDW-SD: 44.4 (ref 36.4–46.3)
WBC: 9.4 10*3/uL (ref 4.0–11.0)

## 2018-04-23 LAB — CULTURE, URINE
CULTURE RESULT: NO GROWTH
Culture result: NO GROWTH

## 2018-04-23 LAB — METABOLIC PANEL, BASIC
Anion gap: 7 mmol/L (ref 5–15)
Anion gap: 6 mmol/L (ref 5–15)
BUN: 9 mg/dl (ref 7–25)
BUN: 10 mg/dl (ref 7–25)
CO2: 22 mEq/L (ref 21–32)
CO2: 24 mEq/L (ref 21–32)
Calcium: 8.7 mg/dl (ref 8.5–10.1)
Calcium: 9.2 mg/dl (ref 8.5–10.1)
Chloride: 114 mEq/L — ABNORMAL HIGH (ref 98–107)
Chloride: 114 mEq/L — ABNORMAL HIGH (ref 98–107)
Creatinine: 1.6 mg/dl — ABNORMAL HIGH (ref 0.6–1.3)
Creatinine: 1.5 mg/dl — ABNORMAL HIGH (ref 0.6–1.3)
GFR est AA: 44
GFR est AA: 47
GFR est non-AA: 36
GFR est non-AA: 39
Glucose: 99 mg/dl (ref 74–106)
Glucose: 95 mg/dl (ref 74–106)
Potassium: 3.9 mEq/L (ref 3.5–5.1)
Potassium: 3.7 mEq/L (ref 3.5–5.1)
Sodium: 143 mEq/L (ref 136–145)
Sodium: 144 mEq/L (ref 136–145)

## 2018-04-23 LAB — CBC W/O DIFF
HCT: 28 % — ABNORMAL LOW (ref 37.0–50.0)
HGB: 9 gm/dl — ABNORMAL LOW (ref 13.0–17.2)
MCH: 30.1 pg (ref 25.4–34.6)
MCHC: 32.1 gm/dl (ref 30.0–36.0)
MCV: 93.6 fL (ref 80.0–98.0)
MPV: 10.9 fL — ABNORMAL HIGH (ref 6.0–10.0)
PLATELET: 241 10*3/uL (ref 140–450)
RBC: 2.99 M/uL — ABNORMAL LOW (ref 3.60–5.20)
RDW-SD: 44.4 (ref 36.4–46.3)
WBC: 9.4 10*3/uL (ref 4.0–11.0)

## 2018-04-23 LAB — CBC WITH AUTOMATED DIFF
BASOPHILS: 0.5 % (ref 0–3)
EOSINOPHILS: 0.5 % (ref 0–5)
HCT: 30.9 % — ABNORMAL LOW (ref 37.0–50.0)
HGB: 10.1 gm/dl — ABNORMAL LOW (ref 13.0–17.2)
IMMATURE GRANULOCYTES: 0.3 % (ref 0.0–3.0)
LYMPHOCYTES: 17.9 % — ABNORMAL LOW (ref 28–48)
MCH: 30.1 pg (ref 25.4–34.6)
MCHC: 32.7 gm/dl (ref 30.0–36.0)
MCV: 92.2 fL (ref 80.0–98.0)
MONOCYTES: 8 % (ref 1–13)
MPV: 10.6 fL — ABNORMAL HIGH (ref 6.0–10.0)
NEUTROPHILS: 72.8 % — ABNORMAL HIGH (ref 34–64)
NRBC: 0 (ref 0–0)
PLATELET: 270 10*3/uL (ref 140–450)
RBC: 3.35 M/uL — ABNORMAL LOW (ref 3.60–5.20)
RDW-SD: 44.1 (ref 36.4–46.3)
WBC: 9.6 10*3/uL (ref 4.0–11.0)

## 2018-04-23 MED ORDER — NALOXONE 0.4 MG/ML INJECTION
0.4 mg/mL | INTRAMUSCULAR | Status: DC | PRN
Start: 2018-04-23 — End: 2018-04-23

## 2018-04-23 MED ORDER — MONTELUKAST 10 MG TAB
10 mg | Freq: Every day | ORAL | Status: DC
Start: 2018-04-23 — End: 2018-04-23

## 2018-04-23 MED ORDER — ONDANSETRON (PF) 4 MG/2 ML INJECTION
4 mg/2 mL | INTRAMUSCULAR | Status: DC | PRN
Start: 2018-04-23 — End: 2018-04-23
  Administered 2018-04-23: 18:00:00 via INTRAVENOUS

## 2018-04-23 MED ORDER — ONDANSETRON (PF) 4 MG/2 ML INJECTION
4 mg/2 mL | Freq: Once | INTRAMUSCULAR | Status: DC | PRN
Start: 2018-04-23 — End: 2018-04-23

## 2018-04-23 MED ORDER — MIDAZOLAM 1 MG/ML IJ SOLN
1 mg/mL | INTRAMUSCULAR | Status: AC
Start: 2018-04-23 — End: ?

## 2018-04-23 MED ORDER — HYDROMORPHONE 1 MG/ML INJECTION SOLUTION
1 mg/mL | INTRAMUSCULAR | Status: DC | PRN
Start: 2018-04-23 — End: 2018-04-23

## 2018-04-23 MED ORDER — LORAZEPAM 1 MG TAB
1 mg | Freq: Once | ORAL | Status: AC
Start: 2018-04-23 — End: 2018-04-23
  Administered 2018-04-23: 13:00:00 via ORAL

## 2018-04-23 MED ORDER — PROPOFOL 10 MG/ML IV EMUL
10 mg/mL | INTRAVENOUS | Status: DC | PRN
Start: 2018-04-23 — End: 2018-04-23
  Administered 2018-04-23: 18:00:00 via INTRAVENOUS

## 2018-04-23 MED ORDER — FENTANYL CITRATE (PF) 50 MCG/ML IJ SOLN
50 mcg/mL | INTRAMUSCULAR | Status: AC
Start: 2018-04-23 — End: ?

## 2018-04-23 MED ORDER — BELLADONNA ALKALOIDS-OPIUM 16.2 MG-60 MG RECTAL SUPPOSITORY
RECTAL | Status: AC
Start: 2018-04-23 — End: ?

## 2018-04-23 MED ORDER — FLUMAZENIL 0.1 MG/ML IV SOLN
0.1 mg/mL | INTRAVENOUS | Status: DC | PRN
Start: 2018-04-23 — End: 2018-04-23

## 2018-04-23 MED ORDER — WHITE PETROLATUM-MINERAL OIL 56.8 %-42.5 % EYE OINTMENT
OPHTHALMIC | Status: DC | PRN
Start: 2018-04-23 — End: 2018-04-23

## 2018-04-23 MED ORDER — MIDAZOLAM 1 MG/ML IJ SOLN
1 mg/mL | INTRAMUSCULAR | Status: DC | PRN
Start: 2018-04-23 — End: 2018-04-23
  Administered 2018-04-23: 18:00:00 via INTRAVENOUS

## 2018-04-23 MED ORDER — ACETAMINOPHEN 1,000 MG/100 ML (10 MG/ML) IV
1000 mg/100 mL (10 mg/mL) | Freq: Once | INTRAVENOUS | Status: AC
Start: 2018-04-23 — End: 2018-04-23
  Administered 2018-04-23: 16:00:00 via INTRAVENOUS

## 2018-04-23 MED ORDER — CEFAZOLIN 2 G IN 100 ML 0.9% NS
2 gram/100 mL | INTRAVENOUS | Status: DC | PRN
Start: 2018-04-23 — End: 2018-04-23
  Administered 2018-04-23: 18:00:00 via INTRAVENOUS

## 2018-04-23 MED ORDER — BELLADONNA ALKALOIDS-OPIUM 16.2 MG-60 MG RECTAL SUPPOSITORY
RECTAL | Status: DC | PRN
Start: 2018-04-23 — End: 2018-04-23
  Administered 2018-04-23: 19:00:00 via RECTAL

## 2018-04-23 MED ORDER — DIPHENHYDRAMINE HCL 50 MG/ML IJ SOLN
50 mg/mL | Freq: Once | INTRAMUSCULAR | Status: DC | PRN
Start: 2018-04-23 — End: 2018-04-23

## 2018-04-23 MED ORDER — DEXAMETHASONE SODIUM PHOSPHATE 10 MG/ML IJ SOLN
10 mg/mL | Freq: Once | INTRAMUSCULAR | Status: AC
Start: 2018-04-23 — End: 2018-04-23
  Administered 2018-04-23: 16:00:00 via INTRAVENOUS

## 2018-04-23 MED ORDER — IOTHALAMATE MEGLUMINE 60 % INJECTION
60 % | INTRAMUSCULAR | Status: DC | PRN
Start: 2018-04-23 — End: 2018-04-23
  Administered 2018-04-23: 19:00:00

## 2018-04-23 MED ORDER — MONTELUKAST 10 MG TAB
10 mg | Freq: Every day | ORAL | Status: DC
Start: 2018-04-23 — End: 2018-04-24
  Administered 2018-04-24: 01:00:00 via ORAL

## 2018-04-23 MED ORDER — FENTANYL CITRATE (PF) 50 MCG/ML IJ SOLN
50 mcg/mL | INTRAMUSCULAR | Status: DC | PRN
Start: 2018-04-23 — End: 2018-04-23
  Administered 2018-04-23: 18:00:00 via INTRAVENOUS

## 2018-04-23 MED ORDER — HALOPERIDOL LACTATE 5 MG/ML IJ SOLN
5 mg/mL | Freq: Once | INTRAMUSCULAR | Status: DC | PRN
Start: 2018-04-23 — End: 2018-04-23

## 2018-04-23 MED ORDER — LIDOCAINE (PF) 20 MG/ML (2 %) IJ SOLN
20 mg/mL (2 %) | INTRAMUSCULAR | Status: DC | PRN
Start: 2018-04-23 — End: 2018-04-23
  Administered 2018-04-23: 18:00:00 via INTRAVENOUS

## 2018-04-23 MED ORDER — GLYCOPYRROLATE 0.2 MG/ML IJ SOLN
0.2 mg/mL | Freq: Once | INTRAMUSCULAR | Status: AC
Start: 2018-04-23 — End: 2018-04-23
  Administered 2018-04-23: 16:00:00 via INTRAVENOUS

## 2018-04-23 MED ORDER — LACTATED RINGERS IV
INTRAVENOUS | Status: DC
Start: 2018-04-23 — End: 2018-04-23
  Administered 2018-04-23: 17:00:00 via INTRAVENOUS

## 2018-04-23 MED ORDER — ACETAMINOPHEN 1,000 MG/100 ML (10 MG/ML) IV
1000 mg/100 mL (10 mg/mL) | Freq: Once | INTRAVENOUS | Status: DC | PRN
Start: 2018-04-23 — End: 2018-04-23

## 2018-04-23 MED FILL — GLYCOPYRROLATE 0.2 MG/ML IJ SOLN: 0.2 mg/mL | INTRAMUSCULAR | Qty: 1

## 2018-04-23 MED FILL — MIDAZOLAM 1 MG/ML IJ SOLN: 1 mg/mL | INTRAMUSCULAR | Qty: 2

## 2018-04-23 MED FILL — BACLOFEN 10 MG TAB: 10 mg | ORAL | Qty: 1

## 2018-04-23 MED FILL — ACETAMINOPHEN 325 MG TABLET: 325 mg | ORAL | Qty: 2

## 2018-04-23 MED FILL — DEXAMETHASONE SODIUM PHOSPHATE 10 MG/ML IJ SOLN: 10 mg/mL | INTRAMUSCULAR | Qty: 1

## 2018-04-23 MED FILL — LORAZEPAM 1 MG TAB: 1 mg | ORAL | Qty: 1

## 2018-04-23 MED FILL — MORPHINE 2 MG/ML INJECTION: 2 mg/mL | INTRAMUSCULAR | Qty: 1

## 2018-04-23 MED FILL — DICYCLOMINE 10 MG CAP: 10 mg | ORAL | Qty: 2

## 2018-04-23 MED FILL — BUPROPION XL 300 MG 24 HR TAB: 300 mg | ORAL | Qty: 1

## 2018-04-23 MED FILL — GABAPENTIN 400 MG CAP: 400 mg | ORAL | Qty: 2

## 2018-04-23 MED FILL — GABAPENTIN 300 MG CAP: 300 mg | ORAL | Qty: 1

## 2018-04-23 MED FILL — BELLADONNA ALKALOIDS-OPIUM 16.2 MG-60 MG RECTAL SUPPOSITORY: RECTAL | Qty: 1

## 2018-04-23 MED FILL — TAMSULOSIN SR 0.4 MG 24 HR CAP: 0.4 mg | ORAL | Qty: 1

## 2018-04-23 MED FILL — OFIRMEV 1,000 MG/100 ML (10 MG/ML) INTRAVENOUS SOLUTION: 1000 mg/100 mL (10 mg/mL) | INTRAVENOUS | Qty: 100

## 2018-04-23 MED FILL — FENTANYL CITRATE (PF) 50 MCG/ML IJ SOLN: 50 mcg/mL | INTRAMUSCULAR | Qty: 2

## 2018-04-23 MED FILL — TOPIRAMATE 100 MG TAB: 100 mg | ORAL | Qty: 2

## 2018-04-23 MED FILL — LACTATED RINGERS IV: INTRAVENOUS | Qty: 1000

## 2018-04-23 NOTE — Progress Notes (Signed)
??                                                           [address]                                                   [place], [place] [address]                                                      Phone: [phone]  Urology Daily Progress Note    Patient Active Problem List   Diagnosis Code   ??? Colitis K52.9   ??? Rectal bleeding K62.5   ??? Obstructive uropathy N13.9         Assessment & Plan     [name] [name] is a 48 year old female:  ??  2 mm obstructing stone right UVJ, moderate hydronephrosis.  - No Leukocytosis. UA with blood, no leuks, no nitrites   - Low grade temp with T max 100.4 (04/21/2018)  ??  AKI  - creat trending up now 2.1>2.0  - Baseline creat around 1.0  - PVRs minimal  ??  Nonobstructing left nephrolithiasis, largest stone measures 5 mm.  ??  ??  Plan:  Maintain NPO status  Plan for OR today with Dr. Louretta Parma for Cysto, right stone extraction, right RPG and right JJ stent placement  Ucx reviewed- NG. Given persistent low grade fevers would continue on abx  Trend Labs. No AM labs done this morning. CBC and BMP ordered  avoid nephrotoxic agents  Attending to see later today  Follow Up arranged: NO  Following closely        [name]  [facility]  Pager # [phone]    Available M-F 7:30- 5pm .  After 5pm and weekends please call answering service at [phone]    ADDENDUM:  Chart reviewed. I agree with the history, physical, assessment and plan as documented above. Additionally, I have addended the history, physical, assessment and plan above (or below) as needed in order to reflect my additional findings.    [name], MD           Subjective   Reports RLQ abdominal pain and nausea. NPO since midnight. Low grade temperature still        Objective       PHYSICAL EXAMINATION:   Visit Vitals  BP 125/73 (BP 1 Location: Left arm, BP Patient Position: Supine)   Pulse 90   Temp 99 ??F (37.2 ??C)   Resp 22   Ht 4' 11.5" (1.511 m)   Wt 110 lb (49.9 kg)   SpO2 100%   BMI 21.85  kg/m??       ??  Constitutional: Well developed, well nourished female.  No acute distress.    HEENT: Normocephalic, Atraumatic  CV:  RRR   Pulm: No respiratory distress or difficulties breathing   GI:  Right LQ abdominal pain to palpation, soft, non-distended  GU:  NO CVA tenderness   Skin: No evidence of jaundice.  Normal color  Neuro/Psych:  Alert and oriented. Affect appropriate.   Lymphatic:    No inguinal lymphadenopathy  MSK: FROM     REVIEW OF LABS AND IMAGING:      Labs:     Labs: Results:   Chemistry    Recent Labs     04/22/18  0325 04/21/18  1745   GLU 94 88   NA 142 141   K 4.2 3.1*   CL 115* 110*   CO2 22 26   BUN 16 18   CREA 2.1* 2.0*   CA 8.1* 8.9   AGAP 5 6   AP 63 83   TP 5.7* 7.5   ALB 2.8* 3.7      CBC w/Diff Recent Labs     04/22/18  0325 04/21/18  1745   WBC 7.6 10.5   RBC 2.95* 3.66   HGB 9.0* 10.9*   HCT 28.2* 33.9*   PLT 264 350   GRANS 71.9* 69.3*   LYMPH 24.5* 20.7*   EOS 0.5 0.3      Cultures No results for input(s): CULT in the last 72 hours.  All Micro Results     Procedure Component Value Units Date/Time    CULTURE, URINE [[id number]] Collected:  04/21/18 2048    Order Status:  Completed Specimen:  Cath Urine Updated:  04/23/18 0811     Culture result No Growth       CULTURE, URINE [[id number]] Collected:  04/21/18 1930    Order Status:  Canceled Specimen:  Urine from Clean catch             Urinalysis Color   Date Value Ref Range Status   04/21/2018 Yellow   Final     Appearance   Date Value Ref Range Status   04/21/2018 Clear   Final     Specific gravity   Date Value Ref Range Status   04/21/2018 1.015 1.005 - 1.030   Final     pH (UA)   Date Value Ref Range Status   04/21/2018 6.0 5 - 9   Final     Protein   Date Value Ref Range Status   04/21/2018 Negative NEGATIVE,Negative mg/dl Final     Ketone   Date Value Ref Range Status   04/21/2018 Negative NEGATIVE,Negative mg/dl Final     Bilirubin   Date Value Ref Range Status   04/21/2018 Negative NEGATIVE,Negative   Final     Blood   Date  Value Ref Range Status   04/21/2018 Moderate (A) NEGATIVE,Negative   Final     Comment:     Operator: [id number]     Urobilinogen   Date Value Ref Range Status   04/21/2018 0.2 0.0 - 1.0 EU/dl Final     Nitrites   Date Value Ref Range Status   04/21/2018 Negative NEGATIVE,Negative   Final     Leukocyte Esterase   Date Value Ref Range Status   04/21/2018 Negative NEGATIVE,Negative   Final     Potassium   Date Value Ref Range Status   04/22/2018 4.2 3.5 - 5.1 mEq/L Final     Creatinine   Date Value Ref Range Status   04/22/2018 2.1 (H) 0.6 - 1.3 mg/dl Final     BUN   Date Value Ref Range Status   04/22/2018 16 7 - 25 mg/dl Final      PSA No results for  input(s): PSA in the last 72 hours.   Coagulation Lab Results   Component Value Date/Time    Prothrombin time 12.7 08/26/2017 03:24 PM    Prothrombin time 13.3 01/10/2012 09:00 AM    INR 1.1 08/26/2017 03:24 PM    INR 1.0 01/10/2012 09:00 AM    aPTT 34.6 08/26/2017 03:24 PM    aPTT 34.8 01/10/2012 09:00 AM

## 2018-04-23 NOTE — Progress Notes (Signed)
Problem: Falls - Risk of  Goal: *Absence of Falls  Description: Document Schmid Fall Risk and appropriate interventions in the flowsheet.  Outcome: Progressing Towards Goal  Note: Fall Risk Interventions:            Medication Interventions: Bed/chair exit alarm, Patient to call before getting OOB, Teach patient to arise slowly

## 2018-04-23 NOTE — Progress Notes (Signed)
Discussed with patient reason for staying over night and need to check blood work in am

## 2018-04-23 NOTE — Anesthesia Pre-Procedure Evaluation (Signed)
Anesthetic History   No history of anesthetic complications            Review of Systems / Medical History  Patient summary reviewed, nursing notes reviewed and pertinent labs reviewed    Pulmonary  Within defined limits                 Neuro/Psych         Neuromuscular disease (Fibromyalgia)     Cardiovascular  Within defined limits                Exercise tolerance: >4 METS     GI/Hepatic/Renal  Within defined limits              Endo/Other  Within defined limits           Other Findings              Physical Exam    Airway  Mallampati: II  TM Distance: > 6 cm  Neck ROM: normal range of motion   Mouth opening: Normal     Cardiovascular  Regular rate and rhythm,  S1 and S2 normal,  no murmur, click, rub, or gallop             Dental  No notable dental hx       Pulmonary  Breath sounds clear to auscultation               Abdominal  GI exam deferred       Other Findings            Anesthetic Plan    ASA: 2  Anesthesia type: general          Induction: Intravenous  Anesthetic plan and risks discussed with: Patient

## 2018-04-23 NOTE — Op Note (Signed)
OPERATION    LOCATION OF PATIENT:  Room: [facility]    DATE OF OPERATION:  04/23/2018    SURGEON:  Robbi Garter, MD    ASSISTANT:   None    PREOPERATIVE DIAGNOSIS:  Right distal ureteral stone 43mm w/ refractory renal colic    POSTOPERATIVE DIAGNOSIS:  Right distal ureteral stone 64mm w/ refractory renal colic    OPERATION PERFORMED:  1. Cystoscopy  2. Right retrograde pyelogram  3. Right ureteroscopy, stone extraction    4. Right ureteral stent placement   5. < 1 hour physician fluoroscopy imaging interpretation time    ANESTHESIA:  General.    URETEROSCOPE IRRIGATION SYSTEM:  Continuous pressure irrigation system.     LASER LITHOTRIPSY MODE:   N/A    URETERAL ACCESS SHEATH USED: No     ESTIMATED BLOOD LOSS:  Minimal.    COMPLICATIONS:  None.    INDICATIONS FOR PROCEDURE:  [name] [name] is a 48 y.o. white female with a Right distal ureteral stone 110mm causing refractory renal colic and unable to pass the stone. She is now brought back today for treatment of the Right distal ureteral stone with cystoscopy, right ureteroscopy, stone extraction, and stent placement. The patient understands risks, benefits, detailed procedure, and possible injury to the urethra, bladder, ureter, and kidney. Also possible need for additional surgery.    FINDINGS:  Right distal ureteral stone 33mm, very soft -- crumbled during extraction.    SPECIMENS:  None    DRAINS:  A 6-French, 22-cm double pigtail stent ([place]) with long tether string.      DESCRIPTION OF OPERATION:  After informed consent was obtained from the patient, the patient was identified and the patient was taken to the operating room on 04/23/2018, and placed in the supine position.  General anesthesia was administered as well as perioperative IV antibiotics.  At the beginning of the case, a time-out was performed to properly identify the patient, surgery to be performed, and surgical site.  Sequential compression devices were applied to the  lower extremities at the beginning of the case for DVT prophylaxis.  The patient was then placed in the dorsal lithotomy supine position, prepped and draped in sterile fashion.  Preliminary scout fluoroscopy revealed no obvious calcification area overlying the right distal ureter. There was a 1mm calcification overlying the left lower kidney, corresponding to the preoperative CT scan.     We then passed the 21-French rigid cystoscope through the urethra and into the bladder under vision without any difficulty.  A systematic evaluation of the bladder revealed no evidence of any suspicious bladder lesions.  Ureteral orifices were in normal position.      We then cannulated the right ureteral orifice with a 5-French open-ended ureteral catheter and gentle retrograde pyelogram was performed, noting a mildly dilated right distal / mid ureter with a 65mm filling defect in the distal ureter, corresponding likely to a stone.  There was no hydronephrosis in the right kidney.  We then passed a 0.038 Glidewire up to the level of the renal pelvis.  The ureteral catheter and cystoscope were then removed, leaving the Glidewire up the ureter.    The semi-rigid ureteroscope was then passed into the bladder under direct vision and then up the the right ureter alongside the pre-existing Glidewire.  Note that we used a continuous pressure irrigation system with the ureteroscope.   In the distal ureter, we encountered 44mm stone.  I then used the 2.2-French tipless  Nitinol basket to extract stone fragments from the distal ureter, noting that the stone was very soft -- it crumbled during extraction, thus no stone sample was sent.     We then advanced the ureteroscope further up the ureter, noting no other stones.  The ureteroscope was then removed while performing a gentle pull-out retrograde pyelogram.      Once the ureteroscope was removed, we then used the Glidewire under fluoroscopic guidance and passed up a [place] 6-French, 22  cm double-pigtail ureteral stent up ureter, making sure that the proximal and distal ends coil within the kidney and bladder respectfully.      Note that we left a long tether string attached to the distal end of the ureteral stent and it exited the urethral meatus and was secured to the inner thigh with a tegaderm adhesive.  The cystoscope was then advanced back into the bladder under vision.  We were able to see the distal stent coiling nicely within the bladder.  We then emptied the bladder of irrigation solution.  The cystoscope was then removed.      At the end of the case, I did insert a B and O suppository into the patient's rectum to help patient tolerate the stent symptoms in immediate postoperative period.  The patient tolerated the procedure well and there was no complication.   Patient was awoken from anesthesia and taken to the recovery room in stable condition.    Plan:   Patient will be transferred back to the floor for observation. If clinically doing well, then she can be discharged home later today or tomorrow.    Follow up with me in 7 to 10 days for stent with string removal in the office (unless patient is able to remove the stent at home).

## 2018-04-23 NOTE — Anesthesia Post-Procedure Evaluation (Signed)
Procedure(s):  CYSTOSCOPY RIGHT URETEROSCOPY STONE EXTRACTION WITH BASKET,  RIGHT STENT PLACEMENT,.    general    Anesthesia Post Evaluation      Multimodal analgesia: multimodal analgesia used between 6 hours prior to anesthesia start to PACU discharge  Patient location during evaluation: bedside  Patient participation: complete - patient participated  Level of consciousness: responsive to verbal stimuli  Pain management: satisfactory to patient  Airway patency: patent  Anesthetic complications: no  Cardiovascular status: acceptable  Respiratory status: acceptable  Hydration status: acceptable  Post anesthesia nausea and vomiting:  none      Vitals Value Taken Time   BP 91/58 04/23/2018  2:46 PM   Temp 36.3 ??C (97.4 ??F) 04/23/2018  2:40 PM   Pulse 68 04/23/2018  2:54 PM   Resp 14 04/23/2018  2:54 PM   SpO2 100 % 04/23/2018  2:54 PM   Vitals shown include unvalidated device data.

## 2018-04-23 NOTE — Progress Notes (Signed)
INTERNAL MEDICINE PROGRESS NOTE  Patient: [name] [name]   Date of Birth: 1971/01/28   MRN: [id number]        Recommendation and Plan:   Avoid Nephrotoxin , Continue with IVF support and monitor renal function   Pain control   Management per urology - possible OR today   Dose of ativan for anxiety       Assessment and Hospital course      Principle Problems:  Ureterolithiasis,??2 mm obstructing stone right UVJ, moderate hydronephrosis  Abdominal pain , Nausea and Vomiting - due to the above   Hypokalemia : mild , corrected   Acute renal insufficiency   - presented with complain of abd pain, N/V, on admission afebrile, vitals stable, WBC normal, Hgb 9, Cr 2.1, UA showed hematuria, LFT and lipase normal   - CT : 2 mm obstructing stone right UVJ, moderate hydronephrosis., Nonobstructing left nephrolithiasis, largest stone measures 5 mm.  - admitted to med surg, urine culture, IVF support, pain control, urology consulted , flomax added    - Urology plan for surgery today, afebrile, WBC normal, Urine culture NG, lab today pending   ??  Active Problems:  Chronic anemia - stable   Depression   Allergy   BPH  Fibromyalgia   Anxiety       Code Status: Full code   DVT prophylaxis: pharmacologic and mechanical  Disposition: home     Subjective:   Patient states she still in pain, no fever , she anxious about procedure     Medical Decision Making   Chart, Images and Lab data reviewed, necessary medical Orders placed   Discussed with nursing staff     Vitals:    04/23/18 0439 04/23/18 0700 04/23/18 0802 04/23/18 0826   BP: (!) 109/94  125/73    Pulse: 72  90    Resp: 18  22    Temp: 99.7 ??F (37.6 ??C)  99 ??F (37.2 ??C)    SpO2: 100% 100% 98% 100%   Weight:       Height:         Temp (24hrs), Avg:99.3 ??F (37.4 ??C), Min:98.4 ??F (36.9 ??C), Max:100.4 ??F (38 ??C)      Intake/Output Summary (Last 24 hours) at 04/23/2018 0829  Last data filed at 04/23/2018 0042  Gross per 24 hour   Intake 1421.67 ml   Output 2300 ml   Net -878.33 ml        Physical Exam:   General Appearance:   Appears in no acute distress.,  Skin:   Skin warm & dry, No rash, No jaundice,   Lymph:  There is no lymphadenopathy,   HEENT:   PERRLA, EOMI, Moist oral mucous membranes, conjunctiva clear,   Neck:   Supple, Without masses,   Lungs:   Clear, No wheezes., No rales., Normal respiratory effort,   Heart:   Regular rate and rhythm, No gallop,   Abdomen:   Soft , Non-distended, Normal bowel sounds and Lt flunk tender,   Extremities:    edema of legs, Normal pedal and radial pulses,   Neuro:   alert, oriented, affect appropriate, speech fluent, cranial nerves intact, no focal neurological deficits and moves all extremities well    Current medications:     Current Facility-Administered Medications   Medication Dose Route Frequency   ??? LORazepam (ATIVAN) tablet 1 mg  1 mg Oral ONCE   ??? tamsulosin (FLOMAX) capsule 0.4 mg  0.4 mg Oral DAILY   ???  baclofen (LIORESAL) tablet 10 mg  10 mg Oral TID   ??? buPROPion XL (WELLBUTRIN XL) tablet 300 mg  300 mg Oral 7am   ??? fluticasone propionate (FLONASE) 50 mcg/actuation nasal spray 2 Spray  2 Spray Both Nostrils DAILY   ??? dicyclomine (BENTYL) capsule 20 mg  20 mg Oral QHS   ??? montelukast (SINGULAIR) tablet 10 mg  10 mg Oral DAILY   ??? topiramate (TOPAMAX) tablet 200 mg  200 mg Oral QHS   ??? sodium chloride (NS) flush 5-10 mL  5-10 mL IntraVENous Q8H   ??? sodium chloride (NS) flush 5-10 mL  5-10 mL IntraVENous PRN   ??? naloxone (NARCAN) injection 0.1 mg  0.1 mg IntraVENous PRN   ??? acetaminophen (TYLENOL) tablet 650 mg  650 mg Oral Q6H PRN   ??? ondansetron (ZOFRAN) injection 4 mg  4 mg IntraVENous Q8H PRN   ??? morphine injection 2 mg  2 mg IntraVENous Q4H PRN   ??? gabapentin (NEURONTIN) capsule 300 mg  300 mg Oral DAILY   ??? gabapentin (NEURONTIN) capsule 800 mg  800 mg Oral QHS          Laboratory and Radiology Data :     No results found for this or any previous visit (from the past 24 hour(s)).    XR Results:  Results from Hospital Encounter  encounter on 01/09/18   XR HAND RT MIN 3 V    Narrative INDICATION:  Pain        EXAMINATION:  XR HAND RT MIN 3 V    COMPARISON:  None    FINDINGS:  There is no fracture or dislocation. There is foreshortening of the third fourth  and fifth metacarpals.      Impression IMPRESSION:  1. Negative for fracture.  2. Foreshortening of the third through fifth metacarpals.         CT Results:  Results from Hospital Encounter encounter on 04/21/18   CT ABD PELV WO CONT    Narrative DICOM format image data is available to non-affiliated external healthcare  facilities or entities on a secure, media free, reciprocally searchable basis  with patient authorization for 12 months following the date of the study.    Clinical history: Right-sided flank and abdominal pain    EXAMINATION:  Noncontrast CT scan abdomen and pelvis 04/21/2018. 5 mm spiral scanning is  performed from the costophrenic angles to the symphysis pubis. Coronal and  sagittal reconstruction imaging has been obtained.    Correlation: 02/17/2016    FINDINGS:  Lung bases are clear. Mild degenerative changes of the spine liver, gallbladder,  spleen, pancreas and adrenal glands are unremarkable.    2 mm obstructing stone right UVJ, moderate right hydronephrosis and  perirenal/periureteric fat stranding. Nonobstructing left nephrolithiasis,  largest stone measures 5 mm.    Bladder is unremarkable. Intrauterine contraceptive device is in satisfactory  position. Bilateral tubal ligation clips. Appendix is not seen. Colon and  terminal ileum are unremarkable.    Stomach and small bowel bowel loops are within normal limits. Abdominal aorta is  unremarkable. No dominant lymph node enlargement. Small amounts of free fluid.      Impression IMPRESSION:  1. 2 mm obstructing stone right UVJ, moderate hydronephrosis.  2. Nonobstructing left nephrolithiasis, largest stone measures 5 mm.  3. Appendectomy and bilateral tubal ligation; IUD in satisfactory position.         MRI  Results:  Results from Hospital Encounter encounter on 05/27/14   MRI BRAIN WO  CONT    Narrative MRI brain without contrast    Indication: Multiple sclerosis    Comparison: 07/09/2010    Technique: Multiplanar, multisequence MR images of the brain were obtained  without contrast.     Findings:  Several subcentimeter foci increased T2 / FLAIR signal in the periventricular  and subcortical white matter as can be seen with provided history of  demyelinating disease appear similar to prior exam.  No new lesion seen.  No  infratentorial lesions. No restricted diffusion.    No abnormal intra or extra-axial fluid collections, midline shift, chronic  hemorrhage, or hydrocephalus.     No restricted diffusion or evidence for acute ischemia    Mucosal thickening noted in both maxillary and sphenoid sinuses with post  surgical changes similar to prior exam. Mastoid air cells are clear.       Flow-voids are maintained.      Impression IMPRESSION: Stable white matter lesions.         Nuclear Medicine Results:  Results from Hospital Encounter encounter on 05/16/14   TRANSCRIBED NUCLEAR MEDICINE       [place] Results:  No results found for this or any previous visit.    IR Results:  No results found for this or any previous visit.    VAS/US Results:  Results from Hospital Encounter encounter on 02/17/16   DUPLEX ABD VISC ART/VEN/ORGANS COMPLETE               Marnee Guarneri M.D.  [facility]ist[facility]  Page [phone]

## 2018-04-23 NOTE — Progress Notes (Signed)
Problem: Falls - Risk of  Goal: *Absence of Falls  Description: Document [name] [name] Fall Risk and appropriate interventions in the flowsheet.  04/23/2018 0308 by Hughie Closs  Outcome: Progressing Towards Goal  Note: Fall Risk Interventions:            Medication Interventions: Bed/chair exit alarm, Patient to call before getting OOB, Teach patient to arise slowly                04/23/2018 0306 by Hughie Closs  Outcome: Progressing Towards Goal  Note: Fall Risk Interventions:            Medication Interventions: Bed/chair exit alarm, Patient to call before getting OOB, Teach patient to arise slowly

## 2018-04-23 NOTE — Progress Notes (Signed)
Returned from or/pcau and now has stent in right leg with sting seen voided x1 1500cc bloody urine vital signs stable

## 2018-04-23 NOTE — Progress Notes (Signed)
Message sent to dr Migdalia Dk reporting anxiety

## 2018-04-23 NOTE — Other (Signed)
TRANSFER - IN REPORT:     [name] [name]  being received from [id number]  for ordered procedure        Information from the following report(s) Kardex, MAR and Recent Results was reviewed     Assessment completed upon patient???s arrival to unit and care assumed.

## 2018-04-23 NOTE — Progress Notes (Signed)
INTERNAL MEDICINE PROGRESS NOTE  Patient: [name] [name]   Date of Birth: 1971/01/28   MRN: [id number]        Recommendation and Plan:   Avoid Nephrotoxin , Continue with IVF support and monitor renal function   Pain control   Management per urology - possible OR today   Dose of ativan for anxiety       Assessment and Hospital course      Principle Problems:  Ureterolithiasis,??2 mm obstructing stone right UVJ, moderate hydronephrosis  Abdominal pain , Nausea and Vomiting - due to the above   Hypokalemia : mild , corrected   Acute renal insufficiency   - presented with complain of abd pain, N/V, on admission afebrile, vitals stable, WBC normal, Hgb 9, Cr 2.1, UA showed hematuria, LFT and lipase normal   - CT : 2 mm obstructing stone right UVJ, moderate hydronephrosis., Nonobstructing left nephrolithiasis, largest stone measures 5 mm.  - admitted to med surg, urine culture, IVF support, pain control, urology consulted , flomax added    - Urology plan for surgery today, afebrile, WBC normal, Urine culture NG, lab today pending   ??  Active Problems:  Chronic anemia - stable   Depression   Allergy   BPH  Fibromyalgia   Anxiety       Code Status: Full code   DVT prophylaxis: pharmacologic and mechanical  Disposition: home     Subjective:   Patient states she still in pain, no fever , she anxious about procedure     Medical Decision Making   Chart, Images and Lab data reviewed, necessary medical Orders placed   Discussed with nursing staff     Vitals:    04/23/18 0439 04/23/18 0700 04/23/18 0802 04/23/18 0826   BP: (!) 109/94  125/73    Pulse: 72  90    Resp: 18  22    Temp: 99.7 ??F (37.6 ??C)  99 ??F (37.2 ??C)    SpO2: 100% 100% 98% 100%   Weight:       Height:         Temp (24hrs), Avg:99.3 ??F (37.4 ??C), Min:98.4 ??F (36.9 ??C), Max:100.4 ??F (38 ??C)      Intake/Output Summary (Last 24 hours) at 04/23/2018 0829  Last data filed at 04/23/2018 0042  Gross per 24 hour   Intake 1421.67 ml   Output 2300 ml   Net -878.33 ml        Physical Exam:   General Appearance:   Appears in no acute distress.,  Skin:   Skin warm & dry, No rash, No jaundice,   Lymph:  There is no lymphadenopathy,   HEENT:   PERRLA, EOMI, Moist oral mucous membranes, conjunctiva clear,   Neck:   Supple, Without masses,   Lungs:   Clear, No wheezes., No rales., Normal respiratory effort,   Heart:   Regular rate and rhythm, No gallop,   Abdomen:   Soft , Non-distended, Normal bowel sounds and Lt flunk tender,   Extremities:    edema of legs, Normal pedal and radial pulses,   Neuro:   alert, oriented, affect appropriate, speech fluent, cranial nerves intact, no focal neurological deficits and moves all extremities well    Current medications:     Current Facility-Administered Medications   Medication Dose Route Frequency   ??? LORazepam (ATIVAN) tablet 1 mg  1 mg Oral ONCE   ??? tamsulosin (FLOMAX) capsule 0.4 mg  0.4 mg Oral DAILY   ???  baclofen (LIORESAL) tablet 10 mg  10 mg Oral TID   ??? buPROPion XL (WELLBUTRIN XL) tablet 300 mg  300 mg Oral 7am   ??? fluticasone propionate (FLONASE) 50 mcg/actuation nasal spray 2 Spray  2 Spray Both Nostrils DAILY   ??? dicyclomine (BENTYL) capsule 20 mg  20 mg Oral QHS   ??? montelukast (SINGULAIR) tablet 10 mg  10 mg Oral DAILY   ??? topiramate (TOPAMAX) tablet 200 mg  200 mg Oral QHS   ??? sodium chloride (NS) flush 5-10 mL  5-10 mL IntraVENous Q8H   ??? sodium chloride (NS) flush 5-10 mL  5-10 mL IntraVENous PRN   ??? naloxone (NARCAN) injection 0.1 mg  0.1 mg IntraVENous PRN   ??? acetaminophen (TYLENOL) tablet 650 mg  650 mg Oral Q6H PRN   ??? ondansetron (ZOFRAN) injection 4 mg  4 mg IntraVENous Q8H PRN   ??? morphine injection 2 mg  2 mg IntraVENous Q4H PRN   ??? gabapentin (NEURONTIN) capsule 300 mg  300 mg Oral DAILY   ??? gabapentin (NEURONTIN) capsule 800 mg  800 mg Oral QHS          Laboratory and Radiology Data :     No results found for this or any previous visit (from the past 24 hour(s)).    XR Results:   Results from Hospital Encounter encounter on 01/09/18   XR HAND RT MIN 3 V    Narrative INDICATION:  Pain        EXAMINATION:  XR HAND RT MIN 3 V    COMPARISON:  None    FINDINGS:  There is no fracture or dislocation. There is foreshortening of the third fourth  and fifth metacarpals.      Impression IMPRESSION:  1. Negative for fracture.  2. Foreshortening of the third through fifth metacarpals.         CT Results:  Results from Hospital Encounter encounter on 04/21/18   CT ABD PELV WO CONT    Narrative DICOM format image data is available to non-affiliated external healthcare  facilities or entities on a secure, media free, reciprocally searchable basis  with patient authorization for 12 months following the date of the study.    Clinical history: Right-sided flank and abdominal pain    EXAMINATION:  Noncontrast CT scan abdomen and pelvis 04/21/2018. 5 mm spiral scanning is  performed from the costophrenic angles to the symphysis pubis. Coronal and  sagittal reconstruction imaging has been obtained.    Correlation: 02/17/2016    FINDINGS:  Lung bases are clear. Mild degenerative changes of the spine liver, gallbladder,  spleen, pancreas and adrenal glands are unremarkable.    2 mm obstructing stone right UVJ, moderate right hydronephrosis and  perirenal/periureteric fat stranding. Nonobstructing left nephrolithiasis,  largest stone measures 5 mm.    Bladder is unremarkable. Intrauterine contraceptive device is in satisfactory  position. Bilateral tubal ligation clips. Appendix is not seen. Colon and  terminal ileum are unremarkable.    Stomach and small bowel bowel loops are within normal limits. Abdominal aorta is  unremarkable. No dominant lymph node enlargement. Small amounts of free fluid.      Impression IMPRESSION:  1. 2 mm obstructing stone right UVJ, moderate hydronephrosis.  2. Nonobstructing left nephrolithiasis, largest stone measures 5 mm.   3. Appendectomy and bilateral tubal ligation; IUD in satisfactory position.         MRI Results:  Results from Hospital Encounter encounter on 05/27/14   MRI BRAIN WO  CONT    Narrative MRI brain without contrast    Indication: Multiple sclerosis    Comparison: 07/09/2010    Technique: Multiplanar, multisequence MR images of the brain were obtained  without contrast.     Findings:  Several subcentimeter foci increased T2 / FLAIR signal in the periventricular  and subcortical white matter as can be seen with provided history of  demyelinating disease appear similar to prior exam.  No new lesion seen.  No  infratentorial lesions. No restricted diffusion.    No abnormal intra or extra-axial fluid collections, midline shift, chronic  hemorrhage, or hydrocephalus.     No restricted diffusion or evidence for acute ischemia    Mucosal thickening noted in both maxillary and sphenoid sinuses with post  surgical changes similar to prior exam. Mastoid air cells are clear.       Flow-voids are maintained.      Impression IMPRESSION: Stable white matter lesions.         Nuclear Medicine Results:  Results from Hospital Encounter encounter on 05/16/14   TRANSCRIBED NUCLEAR MEDICINE       [place] Results:  No results found for this or any previous visit.    IR Results:  No results found for this or any previous visit.    VAS/US Results:  Results from Hospital Encounter encounter on 02/17/16   DUPLEX ABD VISC ART/VEN/ORGANS COMPLETE               Marnee Guarneri M.D.  [facility]ist[facility]  Page [phone]

## 2018-04-23 NOTE — Anesthesia Post-Procedure Evaluation (Signed)
Procedure(s):  CYSTOSCOPY RIGHT URETEROSCOPY STONE EXTRACTION WITH BASKET,  RIGHT STENT PLACEMENT,.    general    Anesthesia Post Evaluation      Multimodal analgesia: multimodal analgesia used between 6 hours prior to anesthesia start to PACU discharge  Patient location during evaluation: bedside  Patient participation: complete - patient participated  Level of consciousness: responsive to verbal stimuli  Pain management: satisfactory to patient  Airway patency: patent  Anesthetic complications: no  Cardiovascular status: acceptable  Respiratory status: acceptable  Hydration status: acceptable  Post anesthesia nausea and vomiting:  none      Vitals Value Taken Time   BP 91/58 04/23/2018  2:46 PM   Temp 36.3 ??C (97.4 ??F) 04/23/2018  2:40 PM   Pulse 68 04/23/2018  2:54 PM   Resp 14 04/23/2018  2:54 PM   SpO2 100 % 04/23/2018  2:54 PM   Vitals shown include unvalidated device data.

## 2018-04-23 NOTE — Other (Signed)
TRANSFER - OUT REPORT:    Verbal report given to [facility] RN on [employer]  being transferred to 4W for routine post - op       Report consisted of patient???s Situation, Background, Assessment and   Recommendations(SBAR).     Information from the following report(s) SBAR, OR Summary, Procedure Summary, Intake/Output, MAR and Recent Results was reviewed with the receiving nurse.    Lines:   Peripheral IV 04/23/18 Right;Upper Cephalic (Active)   Site Assessment Clean, dry, & intact 04/23/2018  3:23 PM   Phlebitis Assessment 0 04/23/2018  3:23 PM   Infiltration Assessment 0 04/23/2018  3:23 PM   Dressing Status Clean, dry, & intact 04/23/2018  3:23 PM   Hub Color/Line Status Pink 04/23/2018  3:23 PM   Alcohol Cap Used Yes 04/23/2018  3:23 PM        Opportunity for questions and clarification was provided.      Patient transported with:   O2 @ 2 liters  Tech

## 2018-04-23 NOTE — Progress Notes (Signed)
Discussed with patient reason for staying over night and need to check blood work in am

## 2018-04-23 NOTE — Progress Notes (Signed)
Message sent to dr alaska reporting anxiety

## 2018-04-23 NOTE — Progress Notes (Addendum)
??                                                           [address]                                                   [place], [place] [address]                                                      Phone: [phone]  Urology Daily Progress Note    Patient Active Problem List   Diagnosis Code   ??? Colitis K52.9   ??? Rectal bleeding K62.5   ??? Obstructive uropathy N13.9         Assessment & Plan     [name] [name] is a 48 year old female:  ??  2 mm obstructing stone right UVJ, moderate hydronephrosis.  - No Leukocytosis. UA with blood, no leuks, no nitrites   - Low grade temp with T max 100.4 (04/21/2018)  ??  AKI  - creat trending up now 2.1>2.0  - Baseline creat around 1.0  - PVRs minimal  ??  Nonobstructing left nephrolithiasis, largest stone measures 5 mm.  ??  ??  Plan:  Maintain NPO status  Plan for OR today with Dr. Louretta Parma for Cysto, right stone extraction, right RPG and right JJ stent placement  Ucx reviewed- NG. Given persistent low grade fevers would continue on abx  Trend Labs. No AM labs done this morning. CBC and BMP ordered  avoid nephrotoxic agents  Attending to see later today  Follow Up arranged: NO  Following closely        [name]  [facility]  Pager # [phone]    Available M-F 7:30- 5pm .  After 5pm and weekends please call answering service at [phone]    ADDENDUM:  Chart reviewed. I agree with the history, physical, assessment and plan as documented above. Additionally, I have addended the history, physical, assessment and plan above (or below) as needed in order to reflect my additional findings.    [name], MD           Subjective   Reports RLQ abdominal pain and nausea. NPO since midnight. Low grade temperature still        Objective       PHYSICAL EXAMINATION:   Visit Vitals  BP 125/73 (BP 1 Location: Left arm, BP Patient Position: Supine)   Pulse 90   Temp 99 ??F (37.2 ??C)   Resp 22   Ht 4' 11.5" (1.511 m)   Wt 110 lb (49.9 kg)   SpO2 100%    BMI 21.85 kg/m??       ??  Constitutional: Well developed, well nourished female.  No acute distress.    HEENT: Normocephalic, Atraumatic  CV:  RRR   Pulm: No respiratory distress or difficulties breathing   GI:  Right LQ abdominal pain to palpation, soft, non-distended  GU:  NO CVA tenderness   Skin: No evidence of jaundice.  Normal color  Neuro/Psych:  Alert and oriented. Affect appropriate.   Lymphatic:    No inguinal lymphadenopathy  MSK: FROM     REVIEW OF LABS AND IMAGING:      Labs:     Labs: Results:   Chemistry    Recent Labs     04/22/18  0325 04/21/18  1745   GLU 94 88   NA 142 141   K 4.2 3.1*   CL 115* 110*   CO2 22 26   BUN 16 18   CREA 2.1* 2.0*   CA 8.1* 8.9   AGAP 5 6   AP 63 83   TP 5.7* 7.5   ALB 2.8* 3.7      CBC w/Diff Recent Labs     04/22/18  0325 04/21/18  1745   WBC 7.6 10.5   RBC 2.95* 3.66   HGB 9.0* 10.9*   HCT 28.2* 33.9*   PLT 264 350   GRANS 71.9* 69.3*   LYMPH 24.5* 20.7*   EOS 0.5 0.3      Cultures No results for input(s): CULT in the last 72 hours.  All Micro Results     Procedure Component Value Units Date/Time    CULTURE, URINE [[id number]] Collected:  04/21/18 2048    Order Status:  Completed Specimen:  Cath Urine Updated:  04/23/18 0811     Culture result No Growth       CULTURE, URINE [[id number]] Collected:  04/21/18 1930    Order Status:  Canceled Specimen:  Urine from Clean catch             Urinalysis Color   Date Value Ref Range Status   04/21/2018 Yellow   Final     Appearance   Date Value Ref Range Status   04/21/2018 Clear   Final     Specific gravity   Date Value Ref Range Status   04/21/2018 1.015 1.005 - 1.030   Final     pH (UA)   Date Value Ref Range Status   04/21/2018 6.0 5 - 9   Final     Protein   Date Value Ref Range Status   04/21/2018 Negative NEGATIVE,Negative mg/dl Final     Ketone   Date Value Ref Range Status   04/21/2018 Negative NEGATIVE,Negative mg/dl Final     Bilirubin   Date Value Ref Range Status   04/21/2018 Negative NEGATIVE,Negative   Final      Blood   Date Value Ref Range Status   04/21/2018 Moderate (A) NEGATIVE,Negative   Final     Comment:     Operator: [id number]     Urobilinogen   Date Value Ref Range Status   04/21/2018 0.2 0.0 - 1.0 EU/dl Final     Nitrites   Date Value Ref Range Status   04/21/2018 Negative NEGATIVE,Negative   Final     Leukocyte Esterase   Date Value Ref Range Status   04/21/2018 Negative NEGATIVE,Negative   Final     Potassium   Date Value Ref Range Status   04/22/2018 4.2 3.5 - 5.1 mEq/L Final     Creatinine   Date Value Ref Range Status   04/22/2018 2.1 (H) 0.6 - 1.3 mg/dl Final     BUN   Date Value Ref Range Status   04/22/2018 16 7 - 25 mg/dl Final      PSA No results for  input(s): PSA in the last 72 hours.   Coagulation Lab Results   Component Value Date/Time    Prothrombin time 12.7 08/26/2017 03:24 PM    Prothrombin time 13.3 01/10/2012 09:00 AM    INR 1.1 08/26/2017 03:24 PM    INR 1.0 01/10/2012 09:00 AM    aPTT 34.6 08/26/2017 03:24 PM    aPTT 34.8 01/10/2012 09:00 AM

## 2018-04-23 NOTE — Anesthesia Pre-Procedure Evaluation (Signed)
Anesthetic History   No history of anesthetic complications            Review of Systems / Medical History  Patient summary reviewed, nursing notes reviewed and pertinent labs reviewed    Pulmonary  Within defined limits                 Neuro/Psych         Neuromuscular disease (Fibromyalgia)     Cardiovascular  Within defined limits                Exercise tolerance: >4 METS     GI/Hepatic/Renal  Within defined limits              Endo/Other  Within defined limits           Other Findings              Physical Exam    Airway  Mallampati: II  TM Distance: > 6 cm  Neck ROM: normal range of motion   Mouth opening: Normal     Cardiovascular  Regular rate and rhythm,  S1 and S2 normal,  no murmur, click, rub, or gallop             Dental  No notable dental hx       Pulmonary  Breath sounds clear to auscultation               Abdominal  GI exam deferred       Other Findings            Anesthetic Plan    ASA: 2  Anesthesia type: general          Induction: Intravenous  Anesthetic plan and risks discussed with: Patient

## 2018-04-23 NOTE — Progress Notes (Signed)
Problem: Falls - Risk of  Goal: *Absence of Falls  Description: Document Schmid Fall Risk and appropriate interventions in the flowsheet.  04/23/2018 0308 by Hama, Selina  Outcome: Progressing Towards Goal  Note: Fall Risk Interventions:            Medication Interventions: Bed/chair exit alarm, Patient to call before getting OOB, Teach patient to arise slowly                04/23/2018 0306 by Hama, Selina  Outcome: Progressing Towards Goal  Note: Fall Risk Interventions:            Medication Interventions: Bed/chair exit alarm, Patient to call before getting OOB, Teach patient to arise slowly

## 2018-04-23 NOTE — Progress Notes (Signed)
Returned from or/pcau and now has stent in right leg with sting seen voided x1 1500cc bloody urine vital signs stable

## 2018-04-23 NOTE — Op Note (Addendum)
OPERATION    LOCATION OF PATIENT:  Room: [facility]    DATE OF OPERATION:  04/23/2018    SURGEON:  Robbi Garter, MD    ASSISTANT:   None    PREOPERATIVE DIAGNOSIS:  Right distal ureteral stone 63mm w/ refractory renal colic    POSTOPERATIVE DIAGNOSIS:  Right distal ureteral stone 61mm w/ refractory renal colic    OPERATION PERFORMED:  1. Cystoscopy  2. Right retrograde pyelogram  3. Right ureteroscopy, stone extraction    4. Right ureteral stent placement   5. < 1 hour physician fluoroscopy imaging interpretation time    ANESTHESIA:  General.    URETEROSCOPE IRRIGATION SYSTEM:  Continuous pressure irrigation system.     LASER LITHOTRIPSY MODE:   N/A    URETERAL ACCESS SHEATH USED: No     ESTIMATED BLOOD LOSS:  Minimal.    COMPLICATIONS:  None.    INDICATIONS FOR PROCEDURE:  [name] [name] is a 48 y.o. white female with a Right distal ureteral stone 1mm causing refractory renal colic and unable to pass the stone. She is now brought back today for treatment of the Right distal ureteral stone with cystoscopy, right ureteroscopy, stone extraction, and stent placement. The patient understands risks, benefits, detailed procedure, and possible injury to the urethra, bladder, ureter, and kidney. Also possible need for additional surgery.    FINDINGS:  Right distal ureteral stone 69mm, very soft -- crumbled during extraction.    SPECIMENS:  None    DRAINS:  A 6-French, 22-cm double pigtail stent ([place]) with long tether string.      DESCRIPTION OF OPERATION:   After informed consent was obtained from the patient, the patient was identified and the patient was taken to the operating room on 04/23/2018, and placed in the supine position.  General anesthesia was administered as well as perioperative IV antibiotics.  At the beginning of the case, a time-out was performed to properly identify the patient, surgery to be performed, and surgical site.  Sequential compression devices were applied to the lower extremities at the beginning of the case for DVT prophylaxis.  The patient was then placed in the dorsal lithotomy supine position, prepped and draped in sterile fashion.  Preliminary scout fluoroscopy revealed no obvious calcification area overlying the right distal ureter. There was a 52mm calcification overlying the left lower kidney, corresponding to the preoperative CT scan.     We then passed the 21-French rigid cystoscope through the urethra and into the bladder under vision without any difficulty.  A systematic evaluation of the bladder revealed no evidence of any suspicious bladder lesions.  Ureteral orifices were in normal position.      We then cannulated the right ureteral orifice with a 5-French open-ended ureteral catheter and gentle retrograde pyelogram was performed, noting a mildly dilated right distal / mid ureter with a 25mm filling defect in the distal ureter, corresponding likely to a stone.  There was no hydronephrosis in the right kidney.  We then passed a 0.038 Glidewire up to the level of the renal pelvis.  The ureteral catheter and cystoscope were then removed, leaving the Glidewire up the ureter.    The semi-rigid ureteroscope was then passed into the bladder under direct vision and then up the the right ureter alongside the pre-existing Glidewire.  Note that we used a continuous pressure irrigation system with the ureteroscope.    In the distal ureter, we encountered 73mm stone.  I then used the 2.2-French  tipless Nitinol basket to extract stone fragments from the distal ureter, noting that the stone was very soft -- it crumbled during extraction, thus no stone sample was sent.     We then advanced the ureteroscope further up the ureter, noting no other stones.  The ureteroscope was then removed while performing a gentle pull-out retrograde pyelogram.      Once the ureteroscope was removed, we then used the Glidewire under fluoroscopic guidance and passed up a [place] 6-French, 22 cm double-pigtail ureteral stent up ureter, making sure that the proximal and distal ends coil within the kidney and bladder respectfully.      Note that we left a long tether string attached to the distal end of the ureteral stent and it exited the urethral meatus and was secured to the inner thigh with a tegaderm adhesive.  The cystoscope was then advanced back into the bladder under vision.  We were able to see the distal stent coiling nicely within the bladder.  We then emptied the bladder of irrigation solution.  The cystoscope was then removed.      At the end of the case, I did insert a B and O suppository into the patient's rectum to help patient tolerate the stent symptoms in immediate postoperative period.  The patient tolerated the procedure well and there was no complication.   Patient was awoken from anesthesia and taken to the recovery room in stable condition.    Plan:   Patient will be transferred back to the floor for observation. If clinically doing well, then she can be discharged home later today or tomorrow.    Follow up with me in 7 to 10 days for stent with string removal in the office (unless patient is able to remove the stent at home).

## 2018-04-24 LAB — CBC WITH AUTO DIFFERENTIAL
Basophils %: 0.2 % (ref 0–3)
Eosinophils %: 0.2 % (ref 0–5)
Hematocrit: 26.8 % — ABNORMAL LOW (ref 37.0–50.0)
Hemoglobin: 8.7 gm/dl — ABNORMAL LOW (ref 13.0–17.2)
Immature Granulocytes: 0.5 % (ref 0.0–3.0)
Lymphocytes %: 22.9 % — ABNORMAL LOW (ref 28–48)
MCH: 30.1 pg (ref 25.4–34.6)
MCHC: 32.5 gm/dl (ref 30.0–36.0)
MCV: 92.7 fL (ref 80.0–98.0)
MPV: 10.4 fL — ABNORMAL HIGH (ref 6.0–10.0)
Monocytes %: 9.6 % (ref 1–13)
Neutrophils %: 66.6 % — ABNORMAL HIGH (ref 34–64)
Nucleated RBCs: 0 (ref 0–0)
Platelets: 212 10*3/uL (ref 140–450)
RBC: 2.89 M/uL — ABNORMAL LOW (ref 3.60–5.20)
RDW-SD: 43.9 (ref 36.4–46.3)
WBC: 9.5 10*3/uL (ref 4.0–11.0)

## 2018-04-24 LAB — BASIC METABOLIC PANEL
Anion Gap: 7 mmol/L (ref 5–15)
BUN: 8 mg/dl (ref 7–25)
CO2: 20 mEq/L — ABNORMAL LOW (ref 21–32)
Calcium: 8.7 mg/dl (ref 8.5–10.1)
Chloride: 114 mEq/L — ABNORMAL HIGH (ref 98–107)
Creatinine: 0.9 mg/dl (ref 0.6–1.3)
EGFR IF NonAfrican American: >60
GFR African American: >60
Glucose: 90 mg/dl (ref 74–106)
Potassium: 3.4 mEq/L — ABNORMAL LOW (ref 3.5–5.1)
Sodium: 142 mEq/L (ref 136–145)

## 2018-04-24 LAB — METABOLIC PANEL, BASIC
Anion gap: 7 mmol/L (ref 5–15)
BUN: 8 mg/dl (ref 7–25)
CO2: 20 mEq/L — ABNORMAL LOW (ref 21–32)
Calcium: 8.7 mg/dl (ref 8.5–10.1)
Chloride: 114 mEq/L — ABNORMAL HIGH (ref 98–107)
Creatinine: 0.9 mg/dl (ref 0.6–1.3)
GFR est AA: >60
GFR est non-AA: >60
Glucose: 90 mg/dl (ref 74–106)
Potassium: 3.4 mEq/L — ABNORMAL LOW (ref 3.5–5.1)
Sodium: 142 mEq/L (ref 136–145)

## 2018-04-24 LAB — CBC WITH AUTOMATED DIFF
BASOPHILS: 0.2 % (ref 0–3)
EOSINOPHILS: 0.2 % (ref 0–5)
HCT: 26.8 % — ABNORMAL LOW (ref 37.0–50.0)
HGB: 8.7 gm/dl — ABNORMAL LOW (ref 13.0–17.2)
IMMATURE GRANULOCYTES: 0.5 % (ref 0.0–3.0)
LYMPHOCYTES: 22.9 % — ABNORMAL LOW (ref 28–48)
MCH: 30.1 pg (ref 25.4–34.6)
MCHC: 32.5 gm/dl (ref 30.0–36.0)
MCV: 92.7 fL (ref 80.0–98.0)
MONOCYTES: 9.6 % (ref 1–13)
MPV: 10.4 fL — ABNORMAL HIGH (ref 6.0–10.0)
NEUTROPHILS: 66.6 % — ABNORMAL HIGH (ref 34–64)
NRBC: 0 (ref 0–0)
PLATELET: 212 10*3/uL (ref 140–450)
RBC: 2.89 M/uL — ABNORMAL LOW (ref 3.60–5.20)
RDW-SD: 43.9 (ref 36.4–46.3)
WBC: 9.5 10*3/uL (ref 4.0–11.0)

## 2018-04-24 MED ORDER — POTASSIUM CHLORIDE ER 20 MEQ TABLET,EXTENDED RELEASE
20 mEq | ORAL_TABLET | Freq: Every day | ORAL | 0 refills | Status: DC
Start: 2018-04-24 — End: 2019-08-17

## 2018-04-24 MED ORDER — NITROFURANTOIN (25% MACROCRYSTAL FORM) 100 MG CAP
100 mg | ORAL_CAPSULE | Freq: Two times a day (BID) | ORAL | 0 refills | Status: AC
Start: 2018-04-24 — End: 2018-04-27

## 2018-04-24 MED ORDER — LORAZEPAM 0.5 MG TAB
0.5 mg | Freq: Four times a day (QID) | ORAL | Status: AC | PRN
Start: 2018-04-24 — End: 2018-04-24
  Administered 2018-04-24: 01:00:00 via ORAL

## 2018-04-24 MED ORDER — OXYCODONE-ACETAMINOPHEN 5 MG-325 MG TAB
5-325 mg | ORAL | Status: AC | PRN
Start: 2018-04-24 — End: 2018-04-24
  Administered 2018-04-24 (×2): via ORAL

## 2018-04-24 MED FILL — GABAPENTIN 300 MG CAP: 300 mg | ORAL | Qty: 1

## 2018-04-24 MED FILL — MONTELUKAST 10 MG TAB: 10 mg | ORAL | Qty: 1

## 2018-04-24 MED FILL — TOPIRAMATE 100 MG TAB: 100 mg | ORAL | Qty: 2

## 2018-04-24 MED FILL — BACLOFEN 10 MG TAB: 10 mg | ORAL | Qty: 1

## 2018-04-24 MED FILL — ONDANSETRON (PF) 4 MG/2 ML INJECTION: 4 mg/2 mL | INTRAMUSCULAR | Qty: 2

## 2018-04-24 MED FILL — ACETAMINOPHEN 325 MG TABLET: 325 mg | ORAL | Qty: 2

## 2018-04-24 MED FILL — LORAZEPAM 0.5 MG TAB: 0.5 mg | ORAL | Qty: 1

## 2018-04-24 MED FILL — OXYCODONE-ACETAMINOPHEN 5 MG-325 MG TAB: 5-325 mg | ORAL | Qty: 1

## 2018-04-24 MED FILL — TAMSULOSIN SR 0.4 MG 24 HR CAP: 0.4 mg | ORAL | Qty: 1

## 2018-04-24 MED FILL — GABAPENTIN 400 MG CAP: 400 mg | ORAL | Qty: 2

## 2018-04-24 MED FILL — DICYCLOMINE 10 MG CAP: 10 mg | ORAL | Qty: 2

## 2018-04-24 NOTE — Progress Notes (Signed)
??                                                           [address]                                                   [place], [place] [address]                                                      Phone: [phone]  Urology Daily Progress Note    Patient Active Problem List   Diagnosis Code   ??? Colitis K52.9   ??? Rectal bleeding K62.5   ??? Obstructive uropathy N13.9         Assessment & Plan     [name] [name] is a 48 year old female:  ??  2 mm obstructing stone right UVJ, moderate hydronephrosis.  - s/p cysto, right RPG, right stone extraction and JJ stent placement 04/23/2018  - Ucx NG  - afebrile, VSS  ??  AKI  - creat improving  - Baseline creat around 1.0  - PVRs minimal  ??  Nonobstructing left nephrolithiasis, largest stone measures 5 mm.  ??  ??  Plan:  Clinically improving and pain controlled  Plan for stent removal in 4-5 days at home- patient taught how to remove and all questions answered  May need to consider alternative medication other than Topamax as this can cause stones  Will need metabolic workup as outpt  Can consider elective stone surgery vs monitoring of left renal stones  Ok to discharge home from a urological standpoint  Follow Up arranged: YES message sent to office to arrange f/up with Dr. Louretta Parma in 6-8 weeks  Will sign off at this time. Please contact with any questions or concerns.         [name]  [facility]  Pager # [phone]    Available M-F 7:30- 5pm .  After 5pm and weekends please call answering service at [phone]    ADDENDUM:  Chart reviewed. I agree with the history, physical, assessment and plan as documented above. Additionally, I have addended the history, physical, assessment and plan above (or below) as needed in order to reflect my additional findings.    -[name], MD           Subjective     No longer having right flank pain, just pain in urethra where stent strings are        Objective       PHYSICAL EXAMINATION:   Visit Vitals  BP  105/65 (BP 1 Location: Left arm, BP Patient Position: Supine)   Pulse 68   Temp 98.8 ??F (37.1 ??C)   Resp 18   Ht 4' 11.5" (1.511 m)   Wt 110 lb (49.9 kg)   SpO2 100%   BMI 21.85 kg/m??       ??  Constitutional: Well developed, well nourished female.  No acute  distress.    HEENT: Normocephalic, Atraumatic  CV:  RRR   Pulm: No respiratory distress or difficulties breathing   GI:  soft, non-distended, non-tender  GU:  NO CVA tenderness. Stent strings attached to left leg  Skin: No evidence of jaundice.  Normal color  Neuro/Psych:  Alert and oriented. Affect appropriate.   Lymphatic:    No inguinal lymphadenopathy  MSK: FROM     REVIEW OF LABS AND IMAGING:      Labs:     Labs: Results:   Chemistry    Recent Labs     04/23/18  1044 04/23/18  0748 04/22/18  0325 04/21/18  1745   GLU 95 99 94 88   NA 144 143 142 141   K 3.7 3.9 4.2 3.1*   CL 114* 114* 115* 110*   CO2 24 22 22 26    BUN 10 9 16 18    CREA 1.5* 1.6* 2.1* 2.0*   CA 9.2 8.7 8.1* 8.9   AGAP 6 7 5 6    AP  --   --  63 83   TP  --   --  5.7* 7.5   ALB  --   --  2.8* 3.7      CBC w/Diff Recent Labs     04/24/18  0834 04/23/18  1044 04/23/18  0748 04/22/18  0325   WBC 9.5 9.6 9.4 7.6   RBC 2.89* 3.35* 2.99* 2.95*   HGB 8.7* 10.1* 9.0* 9.0*   HCT 26.8* 30.9* 28.0* 28.2*   PLT 212 270 241 264   GRANS 66.6* 72.8*  --  71.9*   LYMPH 22.9* 17.9*  --  24.5*   EOS 0.2 0.5  --  0.5      Cultures No results for input(s): CULT in the last 72 hours.  All Micro Results     Procedure Component Value Units Date/Time    CULTURE, URINE [[id number]] Collected:  04/21/18 2048    Order Status:  Completed Specimen:  Cath Urine Updated:  04/23/18 0811     Culture result No Growth       CULTURE, URINE [[id number]] Collected:  04/21/18 1930    Order Status:  Canceled Specimen:  Urine from Clean catch             Urinalysis Color   Date Value Ref Range Status   04/21/2018 Yellow   Final     Appearance   Date Value Ref Range Status   04/21/2018 Clear   Final     Specific gravity   Date Value Ref  Range Status   04/21/2018 1.015 1.005 - 1.030   Final     pH (UA)   Date Value Ref Range Status   04/21/2018 6.0 5 - 9   Final     Protein   Date Value Ref Range Status   04/21/2018 Negative NEGATIVE,Negative mg/dl Final     Ketone   Date Value Ref Range Status   04/21/2018 Negative NEGATIVE,Negative mg/dl Final     Bilirubin   Date Value Ref Range Status   04/21/2018 Negative NEGATIVE,Negative   Final     Blood   Date Value Ref Range Status   04/21/2018 Moderate (A) NEGATIVE,Negative   Final     Comment:     Operator: [id number]     Urobilinogen   Date Value Ref Range Status   04/21/2018 0.2 0.0 - 1.0 EU/dl Final     Nitrites   Date Value Ref Range  Status   04/21/2018 Negative NEGATIVE,Negative   Final     Leukocyte Esterase   Date Value Ref Range Status   04/21/2018 Negative NEGATIVE,Negative   Final     Potassium   Date Value Ref Range Status   04/23/2018 3.7 3.5 - 5.1 mEq/L Final     Creatinine   Date Value Ref Range Status   04/23/2018 1.5 (H) 0.6 - 1.3 mg/dl Final     BUN   Date Value Ref Range Status   04/23/2018 10 7 - 25 mg/dl Final      PSA No results for input(s): PSA in the last 72 hours.   Coagulation Lab Results   Component Value Date/Time    Prothrombin time 12.7 08/26/2017 03:24 PM    Prothrombin time 13.3 01/10/2012 09:00 AM    INR 1.1 08/26/2017 03:24 PM    INR 1.0 01/10/2012 09:00 AM    aPTT 34.6 08/26/2017 03:24 PM    aPTT 34.8 01/10/2012 09:00 AM

## 2018-04-24 NOTE — Progress Notes (Signed)
Discharge Date: 04/24/2018    Discharge Location: Home     Discharge Needs: None    Transportation: Family will transport home     Communication with: Patient, RN, MD

## 2018-04-24 NOTE — Discharge Summary (Signed)
Discharge Summary    Patient: [name] [name] Age: 48 y.o. Sex: female    Date of Birth: 09/12/1970 Admit Date: 04/21/2018 PCP: Vivianne Master, MD   MRN: [id number]  CSN: [id number]       Author: Iline Oven. Keesha Pellum, MD  Service: [facility]  Pager: [phone]  Date: April 24, 2018    Encounter Summary:     Admission Date: 04/21/2018  Discharge Date: April 24, 2018  Length of Stay: 2 Days  Consultants: Urology  Procedures: None  Disposition: Home or Self Care  Condition: Stable  Diet: Low-salt, low fat  Activities: No restrictions  Code Status: Full Code  Discharge Time: < 30 minutes.    Discharge Diagnoses:     Ureterolithiasis,??2 mm obstructing stone right UVJ, moderate hydronephrosis  Abdominal pain??,??Nausea and Vomiting??- due to the above??  Hypokalemia??: mild , corrected  Chronic kidney disease (CKD), Stage 3  Chronic anemia  Fibromyalgia  Anxiety??  Allergy   ??  Hx: Colitis  Hx: Palate mass  Hx: Rectal bleeding    Discharge Follow-up:     Issues Needing Addressed:     1. JJ stent removal at home in 4-5 days  2. Follow-up BMP for creatinine and potassium  3. Outpatient workup of stones  4. Consider changing Topamax -- can cause stones  5. Consider elective stone surgery    Follow-up Information     Follow up With Specialties Details Why Contact Info    [name], MD Urology In 8 weeks Post Op [address]  [place] [address]  [phone]      Vivianne Master, MD [name]   [address]  [place] [address]  [phone]           Discharge Medications:     Current Discharge Medication List      START taking these medications    Details   potassium chloride SR (K-TAB) 20 mEq tablet Take 1 Tab by mouth daily.  Qty: 5 Tab, Refills: 0         CONTINUE these medications which have NOT CHANGED    Details   baclofen (LIORESAL) 10 mg tablet Take 10 mg by mouth three (3) times daily. Indications: as needed      montelukast (SINGULAIR) 10 mg tablet Take 10 mg by mouth daily.  Indications: Takes generic      fluticasone propionate (FLONASE) 50 mcg/actuation nasal spray 2 Sprays by Both Nostrils route daily.      butalbital-acetaminophen-caffeine (FIORICET, ESGIC) 50-325-40 mg per tablet Take 1 Tab by mouth.      calcium-cholecalciferol, D3, (CALTRATE 600+D) tablet Take 1 Tab by mouth daily.      buPROPion XL (WELLBUTRIN XL) 300 mg XL tablet Take 300 mg by mouth every morning.      MODAFINIL (PROVIGIL PO) Take  by mouth.      gabapentin (NEURONTIN) 600 mg tablet Take 300 mg by mouth two (2) times a day. Indications: 300 twice daily as needed, 800 mg at bedtime      topiramate (TOPAMAX) 100 mg tablet Take 200 mg by mouth nightly.      traZODone (DESYREL) 100 mg tablet Take 100 mg by mouth nightly.      dicyclomine (BENTYL) 20 mg tablet Take 20 mg by mouth nightly.      albuterol (PROVENTIL HFA, VENTOLIN HFA, PROAIR HFA) 90 mcg/actuation inhaler Take 2 Puffs by inhalation every six (6) hours as needed for Wheezing.  Cetirizine (ZYRTEC) 10 mg cap Take  by mouth.      azelastine (ASTEPRO) 0.15 % (205.5 mcg) 2 Sprays two (2) times a day.           Discharge Instructions:     Hospital course and discharge diagnoses were discussed with the patient. Patient understands and is in agreement with discharge plan.  I answered any question that the patient and/or family had.     HPI (Per Admitting Provider):     [name] [name]??is a 48 y.o.??year old female??who presents with   ??right-sided flank pain and right lower quadrant abdominal pain scribed as sharp that is been intermittent since onset yesterday evening, has had nausea and vomiting. Patient denies fever. No diarrhea. CT shows??2 mm obstructing stone right UVJ, moderate hydronephrosis??and??Nonobstructing left nephrolithiasis, largest stone measures 5 mm.??In Ed, Urology consulted.??    Hospital Course:     Admitted. Urology consulted. IV fluids and pain medication given.  S/p cysto, right RPG, right stone extraction and JJ stent placement 04/23/2018.   Patient pain-free.  Creatinine has returned to normal. Afebrile. Negative urine culture. History of EBSL.  No antibiotics at discharge.  5 days of KCl.    Labs Results:     04/24/18: Na 142, K 3.4, CO2 20, AG 7, BUN 8, Cr 0.9, Ca 8.7  04/24/18: WBC 9.5, Hb 8.7, Hct 26.8, Plt 212, MCV 92.7  04/23/18: Na 144, K 3.7, CO2 24, AG 6, BUN 10, Cr 1.5, Ca 9.2  04/23/18: WBC 9.6, Hb 10.1, Hct 30.9, Plt 270, MCV 92.2  04/23/18: Na 143, K 3.9, CO2 22, AG 7, BUN 9, Cr 1.6, Ca 8.7  04/23/18: WBC 9.4, Hb 9.0, Hct 28.0, Plt 241, MCV 93.6  04/21/18: U/A: SG 1.015, pH 6.0, Prot -, Bld Mod, LE -, NI -  04/21/18: U/A: SG 1.020, pH 7.0, Prot -, Bld Trc-intact, LE Sml, NI -, WBC Occ, RBC Occ, Bact Occ  04/21/18: HCG urine, QL = negative  04/21/18: Lipase = 164    Radiology:     04/23/18: XR RETRO URETHROCYSTOGRAPHY (14:29)  ??  - 4 fluoroscopic images.  - 53 seconds of fluoroscopic assistance.  ??  04/21/18: CT ABD PELV WO CONT (19:21)  ??  - 2 mm obstructing stone right UVJ, moderate hydronephrosis.  - Nonobstructing left nephrolithiasis, largest stone measures 5 mm.  - Appendectomy and bilateral tubal ligation; IUD in satisfactory position.    Cardiology:     None    Microbiology:     04/21/18: Urine Culture (20:48) - No growth     Other Results     None    Discharge Exam:     Visit Vitals  BP 104/68 (BP 1 Location: Left arm, BP Patient Position: Supine)   Pulse 71   Temp 98.1 ??F (36.7 ??C)   Resp 18   Ht 4' 11.5" (1.511 m)   Wt 49.9 kg (110 lb)   SpO2 100%   BMI 21.85 kg/m??      General: Appears stated age.  Head: Normocephalic. Atraumatic  Eyes: Aniecteric scelera.  Mouth: Normal tongue.   Neck: Supple. Trachea midline.   Chest: No abnormal curvature of spine.  Lungs: Breath sounds present bilaterally  Heart: Regular rhythm. No extra sounds or murmurs.  Abdomen: Soft. Bowel sounds present.  Extremities: No amputations. No asymmetry  Skin: No rashes or lesions. No cyanosis.  MSK: No joint swelling / redness.  Neurologic: No new focal  defects

## 2018-04-24 NOTE — Discharge Summary (Signed)
Discharge Summary    Patient: [name] [name] Age: 48 y.o. Sex: female    Date of Birth: 1970-10-19 Admit Date: 04/21/2018 PCP: Vivianne Master, MD   MRN: [id number]  CSN: [id number]       Author: Iline Oven. Siddiq Kaluzny, MD  Service: [facility]  Pager: [phone]  Date: April 24, 2018    Encounter Summary:     Admission Date: 04/21/2018  Discharge Date: April 24, 2018  Length of Stay: 2 Days  Consultants: Urology  Procedures: None  Disposition: Home or Self Care  Condition: Stable  Diet: Low-salt, low fat  Activities: No restrictions  Code Status: Full Code  Discharge Time: < 30 minutes.    Discharge Diagnoses:     Ureterolithiasis,??2 mm obstructing stone right UVJ, moderate hydronephrosis  Abdominal pain??,??Nausea and Vomiting??- due to the above??  Hypokalemia??: mild , corrected  Chronic kidney disease (CKD), Stage 3  Chronic anemia  Fibromyalgia  Anxiety??  Allergy   ??  Hx: Colitis  Hx: Palate mass  Hx: Rectal bleeding    Discharge Follow-up:     Issues Needing Addressed:     1. JJ stent removal at home in 4-5 days  2. Follow-up BMP for creatinine and potassium  3. Outpatient workup of stones  4. Consider changing Topamax -- can cause stones  5. Consider elective stone surgery    Follow-up Information     Follow up With Specialties Details Why Contact Info    [name], MD Urology In 8 weeks Post Op [address]  [place] [address]  [phone]      Vivianne Master, MD [facility]   [address]  [place] [address]  [phone]           Discharge Medications:     Current Discharge Medication List      START taking these medications    Details   potassium chloride SR (K-TAB) 20 mEq tablet Take 1 Tab by mouth daily.  Qty: 5 Tab, Refills: 0         CONTINUE these medications which have NOT CHANGED    Details   baclofen (LIORESAL) 10 mg tablet Take 10 mg by mouth three (3) times daily. Indications: as needed       montelukast (SINGULAIR) 10 mg tablet Take 10 mg by mouth daily. Indications: Takes generic      fluticasone propionate (FLONASE) 50 mcg/actuation nasal spray 2 Sprays by Both Nostrils route daily.      butalbital-acetaminophen-caffeine (FIORICET, ESGIC) 50-325-40 mg per tablet Take 1 Tab by mouth.      calcium-cholecalciferol, D3, (CALTRATE 600+D) tablet Take 1 Tab by mouth daily.      buPROPion XL (WELLBUTRIN XL) 300 mg XL tablet Take 300 mg by mouth every morning.      MODAFINIL (PROVIGIL PO) Take  by mouth.      gabapentin (NEURONTIN) 600 mg tablet Take 300 mg by mouth two (2) times a day. Indications: 300 twice daily as needed, 800 mg at bedtime      topiramate (TOPAMAX) 100 mg tablet Take 200 mg by mouth nightly.      traZODone (DESYREL) 100 mg tablet Take 100 mg by mouth nightly.      dicyclomine (BENTYL) 20 mg tablet Take 20 mg by mouth nightly.      albuterol (PROVENTIL HFA, VENTOLIN HFA, PROAIR HFA) 90 mcg/actuation inhaler Take 2 Puffs by inhalation every six (6) hours as needed for Wheezing.  Cetirizine (ZYRTEC) 10 mg cap Take  by mouth.      azelastine (ASTEPRO) 0.15 % (205.5 mcg) 2 Sprays two (2) times a day.           Discharge Instructions:     Hospital course and discharge diagnoses were discussed with the patient. Patient understands and is in agreement with discharge plan.  I answered any question that the patient and/or family had.     HPI (Per Admitting Provider):     [name] [name]??is a 48 y.o.??year old female??who presents with   ??right-sided flank pain and right lower quadrant abdominal pain scribed as sharp that is been intermittent since onset yesterday evening, has had nausea and vomiting. Patient denies fever. No diarrhea. CT shows??2 mm obstructing stone right UVJ, moderate hydronephrosis??and??Nonobstructing left nephrolithiasis, largest stone measures 5 mm.??In Ed, Urology consulted.??    Hospital Course:      Admitted. Urology consulted. IV fluids and pain medication given.  S/p cysto, right RPG, right stone extraction and JJ stent placement 04/23/2018.  Patient pain-free.  Creatinine has returned to normal. Afebrile. Negative urine culture. History of EBSL.  No antibiotics at discharge.  5 days of KCl.    Labs Results:     04/24/18: Na 142, K 3.4, CO2 20, AG 7, BUN 8, Cr 0.9, Ca 8.7  04/24/18: WBC 9.5, Hb 8.7, Hct 26.8, Plt 212, MCV 92.7  04/23/18: Na 144, K 3.7, CO2 24, AG 6, BUN 10, Cr 1.5, Ca 9.2  04/23/18: WBC 9.6, Hb 10.1, Hct 30.9, Plt 270, MCV 92.2  04/23/18: Na 143, K 3.9, CO2 22, AG 7, BUN 9, Cr 1.6, Ca 8.7  04/23/18: WBC 9.4, Hb 9.0, Hct 28.0, Plt 241, MCV 93.6  04/21/18: U/A: SG 1.015, pH 6.0, Prot -, Bld Mod, LE -, NI -  04/21/18: U/A: SG 1.020, pH 7.0, Prot -, Bld Trc-intact, LE Sml, NI -, WBC Occ, RBC Occ, Bact Occ  04/21/18: HCG urine, QL = negative  04/21/18: Lipase = 164    Radiology:     04/23/18: XR RETRO URETHROCYSTOGRAPHY (14:29)  ??  - 4 fluoroscopic images.  - 53 seconds of fluoroscopic assistance.  ??  04/21/18: CT ABD PELV WO CONT (19:21)  ??  - 2 mm obstructing stone right UVJ, moderate hydronephrosis.  - Nonobstructing left nephrolithiasis, largest stone measures 5 mm.  - Appendectomy and bilateral tubal ligation; IUD in satisfactory position.    Cardiology:     None    Microbiology:     04/21/18: Urine Culture (20:48) - No growth     Other Results     None    Discharge Exam:     Visit Vitals  BP 104/68 (BP 1 Location: Left arm, BP Patient Position: Supine)   Pulse 71   Temp 98.1 ??F (36.7 ??C)   Resp 18   Ht 4' 11.5" (1.511 m)   Wt 49.9 kg (110 lb)   SpO2 100%   BMI 21.85 kg/m??      General: Appears stated age.  Head: Normocephalic. Atraumatic  Eyes: Aniecteric scelera.  Mouth: Normal tongue.   Neck: Supple. Trachea midline.   Chest: No abnormal curvature of spine.  Lungs: Breath sounds present bilaterally  Heart: Regular rhythm. No extra sounds or murmurs.  Abdomen: Soft. Bowel sounds present.   Extremities: No amputations. No asymmetry  Skin: No rashes or lesions. No cyanosis.  MSK: No joint swelling / redness.  Neurologic: No new focal defects

## 2018-04-24 NOTE — Telephone Encounter (Addendum)
-----   Message from Dolores Hoose sent at 04/24/2018 10:12 AM EDT -----  Regarding: hospital f/u    ----- Message -----  From: Phillip Heal, NP  Sent: 04/24/2018  10:01 AM EDT  To: Dolores Hoose    Patient seen at [facility] for ureteral stone, had stone extraction and JJ stent placed by Dr. Louretta Parma. She will be removing her stent in about 5 business days (please call to make sure she had no issues with removing). She will also need an appointment with Dr. Louretta Parma in about 6-8 weeks (can check with him as he may want to see her later than this OR may be able to do via telehealth)    Thank you!  Phillip Heal, NP    Spoke with patient and scheduled appt

## 2018-04-24 NOTE — Progress Notes (Signed)
Discharge Date: 04/24/2018    Discharge Location: Home     Discharge Needs: None    Transportation: Family will transport home     Communication with: Patient, RN, MD

## 2018-04-24 NOTE — Other (Addendum)
----------  DocumentID: [id number]------------------------------------------------              [facility]                       Patient Education Report         Name: [name] [name], [name] [name]                  Date: 04/21/2018    MRN: [id number]                    Time: 10:29:28 PM         Patient ordered video: 'Patient Safety: Stay Safe While you are in the Hospital'    from [id number] via phone number: [phone] at 10:29:28 PM    Description: This program outlines some of the precautions patients can take to ensure a speedy recovery without extra complications. The video emphasizes the importance of communicating with the healthcare team.    ----------DocumentID: [id number]------------------------------------------------                       [facility]          Patient Education Report - Discharge Summary        Date: 04/24/2018   Time: 2:21:32 PM   Name: [name] [name], [name] [name]   MRN: [id number]      Account Number: [id number]      Education History:        Patient ordered video: 'Patient Safety: Stay Safe While you are in the Hospital' from [id number] on 04/21/2018 10:29:28 PM

## 2018-04-24 NOTE — Progress Notes (Addendum)
??                                                           [address]                                                   [place], [place] [address]                                                      Phone: [phone]  Urology Daily Progress Note    Patient Active Problem List   Diagnosis Code   ??? Colitis K52.9   ??? Rectal bleeding K62.5   ??? Obstructive uropathy N13.9         Assessment & Plan     [name] [name] is a 48 year old female:  ??  2 mm obstructing stone right UVJ, moderate hydronephrosis.  - s/p cysto, right RPG, right stone extraction and JJ stent placement 04/23/2018  - Ucx NG  - afebrile, VSS  ??  AKI  - creat improving  - Baseline creat around 1.0  - PVRs minimal  ??  Nonobstructing left nephrolithiasis, largest stone measures 5 mm.  ??  ??  Plan:  Clinically improving and pain controlled  Plan for stent removal in 4-5 days at home- patient taught how to remove and all questions answered  May need to consider alternative medication other than Topamax as this can cause stones  Will need metabolic workup as outpt  Can consider elective stone surgery vs monitoring of left renal stones  Ok to discharge home from a urological standpoint  Follow Up arranged: YES message sent to office to arrange f/up with Dr. Louretta Parma in 6-8 weeks  Will sign off at this time. Please contact with any questions or concerns.         [name]  [facility]  Pager # [phone]    Available M-F 7:30- 5pm .  After 5pm and weekends please call answering service at [phone]    ADDENDUM:  Chart reviewed. I agree with the history, physical, assessment and plan as documented above. Additionally, I have addended the history, physical, assessment and plan above (or below) as needed in order to reflect my additional findings.    -[name], MD           Subjective     No longer having right flank pain, just pain in urethra where stent strings are        Objective       PHYSICAL EXAMINATION:   Visit Vitals   BP 105/65 (BP 1 Location: Left arm, BP Patient Position: Supine)   Pulse 68   Temp 98.8 ??F (37.1 ??C)   Resp 18   Ht 4' 11.5" (1.511 m)   Wt 110 lb (49.9 kg)   SpO2 100%   BMI 21.85 kg/m??       ??  Constitutional: Well developed, well nourished female.  No acute  distress.    HEENT: Normocephalic, Atraumatic  CV:  RRR   Pulm: No respiratory distress or difficulties breathing   GI:  soft, non-distended, non-tender  GU:  NO CVA tenderness. Stent strings attached to left leg  Skin: No evidence of jaundice.  Normal color  Neuro/Psych:  Alert and oriented. Affect appropriate.   Lymphatic:    No inguinal lymphadenopathy  MSK: FROM     REVIEW OF LABS AND IMAGING:      Labs:     Labs: Results:   Chemistry    Recent Labs     04/23/18  1044 04/23/18  0748 04/22/18  0325 04/21/18  1745   GLU 95 99 94 88   NA 144 143 142 141   K 3.7 3.9 4.2 3.1*   CL 114* 114* 115* 110*   CO2 24 22 22 26    BUN 10 9 16 18    CREA 1.5* 1.6* 2.1* 2.0*   CA 9.2 8.7 8.1* 8.9   AGAP 6 7 5 6    AP  --   --  63 83   TP  --   --  5.7* 7.5   ALB  --   --  2.8* 3.7      CBC w/Diff Recent Labs     04/24/18  0834 04/23/18  1044 04/23/18  0748 04/22/18  0325   WBC 9.5 9.6 9.4 7.6   RBC 2.89* 3.35* 2.99* 2.95*   HGB 8.7* 10.1* 9.0* 9.0*   HCT 26.8* 30.9* 28.0* 28.2*   PLT 212 270 241 264   GRANS 66.6* 72.8*  --  71.9*   LYMPH 22.9* 17.9*  --  24.5*   EOS 0.2 0.5  --  0.5      Cultures No results for input(s): CULT in the last 72 hours.  All Micro Results     Procedure Component Value Units Date/Time    CULTURE, URINE [[id number]] Collected:  04/21/18 2048    Order Status:  Completed Specimen:  Cath Urine Updated:  04/23/18 0811     Culture result No Growth       CULTURE, URINE [[id number]] Collected:  04/21/18 1930    Order Status:  Canceled Specimen:  Urine from Clean catch             Urinalysis Color   Date Value Ref Range Status   04/21/2018 Yellow   Final     Appearance   Date Value Ref Range Status   04/21/2018 Clear   Final     Specific gravity    Date Value Ref Range Status   04/21/2018 1.015 1.005 - 1.030   Final     pH (UA)   Date Value Ref Range Status   04/21/2018 6.0 5 - 9   Final     Protein   Date Value Ref Range Status   04/21/2018 Negative NEGATIVE,Negative mg/dl Final     Ketone   Date Value Ref Range Status   04/21/2018 Negative NEGATIVE,Negative mg/dl Final     Bilirubin   Date Value Ref Range Status   04/21/2018 Negative NEGATIVE,Negative   Final     Blood   Date Value Ref Range Status   04/21/2018 Moderate (A) NEGATIVE,Negative   Final     Comment:     Operator: [id number]     Urobilinogen   Date Value Ref Range Status   04/21/2018 0.2 0.0 - 1.0 EU/dl Final     Nitrites   Date Value Ref Range  Status   04/21/2018 Negative NEGATIVE,Negative   Final     Leukocyte Esterase   Date Value Ref Range Status   04/21/2018 Negative NEGATIVE,Negative   Final     Potassium   Date Value Ref Range Status   04/23/2018 3.7 3.5 - 5.1 mEq/L Final     Creatinine   Date Value Ref Range Status   04/23/2018 1.5 (H) 0.6 - 1.3 mg/dl Final     BUN   Date Value Ref Range Status   04/23/2018 10 7 - 25 mg/dl Final      PSA No results for input(s): PSA in the last 72 hours.   Coagulation Lab Results   Component Value Date/Time    Prothrombin time 12.7 08/26/2017 03:24 PM    Prothrombin time 13.3 01/10/2012 09:00 AM    INR 1.1 08/26/2017 03:24 PM    INR 1.0 01/10/2012 09:00 AM    aPTT 34.6 08/26/2017 03:24 PM    aPTT 34.8 01/10/2012 09:00 AM

## 2018-06-03 ENCOUNTER — Ambulatory Visit
Admit: 2018-06-03 | Discharge: 2018-06-03 | Payer: PRIVATE HEALTH INSURANCE | Attending: Urology | Primary: Geriatric Medicine

## 2018-06-03 ENCOUNTER — Encounter: Payer: PRIVATE HEALTH INSURANCE | Attending: Urology | Primary: Geriatric Medicine

## 2018-06-03 ENCOUNTER — Ambulatory Visit: Attending: Urology | Primary: Geriatric Medicine

## 2018-06-03 DIAGNOSIS — N200 Calculus of kidney: Secondary | ICD-10-CM

## 2018-06-03 NOTE — Progress Notes (Signed)
Progress  Notes by Etheleen NicksKwong, Huma Imhoff O, MD at 06/03/18 1400                Author: Etheleen NicksKwong, Jakolby Sedivy O, MD  Service: --  Author Type: Physician       Filed: 06/03/18 1424  Encounter Date: 06/03/2018  Status: Signed          Editor: Etheleen NicksKwong, Mckenzi Buonomo O, MD (Physician)                    Telemedicine Visit   [name] [name]   DOB 10-30-1970    Encounter Date: 06/03/2018            Encounter Diagnoses              ICD-10-CM  ICD-9-CM          1.  Kidney stones  N20.0  592.0            ASSESSMENT:    Right distal ureteral stone 2mm causing refractory renal colic and unable to pass the stone.    -- 04/23/2018 s/p Cysto, Right URS, stone [facility]      Left kidney stone 5mm on 04/21/2018 CT scan      PLAN:     Stone prevention tips discussed:   A) Increase fluid consumption to make at least 2-2.5 litre of urine per day or consume at least 3 liters of fluid daily.   B) To decrease Calcium in urine, Decrease dietary Salt (or Sodium) intake to <3200 mg/day.   C) To decrease Oxalate in urine, Avoid dark drinks (ie, Coffee, Tea, Colas, etc), nuts, chocolate   D) To increase Citrate in urine, drink lemonade or and lemon juice to your water      F/U 6 months with KUB to assess for further stones.           Chief Complaint       Patient presents with        ?  Kidney Stone           HISTORY OF PRESENT ILLNESS:  [name] [name]  is a 48 y.o. white female who  presents in follow up for a Right distal ureteral stone 2mm causing refractory renal colic and unable to pass the stone.    04/23/2018 s/p Cysto, Right URS, stone extraction and ureteral stent [facility]   FINDINGS: Right distal ureteral stone 2mm, very soft -- crumbled during extraction.      >>>Pt removed stent w/ string at home w/ no difficulty.    Doing well currently but noted some mild right back pain for a few days after stent removal    -- does have fibromyalgia since age 48 yo.    No voiding difficulty.  No hematuria, dysuria.       04/21/2018  CT scan:   1. Obstructing stone right UVJ stone 2mm, moderate hydronephrosis.   2. Nonobstructing left nephrolithiasis, largest stone measures 5 mm.   3. Appendectomy and bilateral tubal ligation; IUD in satisfactory position.   2   PMHx, PSHx, SOCHx, FAMHx:   Unchanged as documented on 06/03/2018         Past Medical History:        Diagnosis  Date         ?  Chronic kidney disease            stage 3         ?  Fibromyalgia           ?  Palate mass            Past Surgical History:         Procedure  Laterality  Date          ?  HX APPENDECTOMY         ?  HX CESAREAN SECTION         ?  HX HERNIA REPAIR              left          ?  HX OTHER SURGICAL              jaw surgery          ?  HX OVARIAN CYST REMOVAL         ?  HX SEPTOPLASTY              ?  HX TUBAL LIGATION              Social History          Socioeconomic History         ?  Marital status:  MARRIED              Spouse name:  Not on file         ?  Number of children:  Not on file     ?  Years of education:  Not on file     ?  Highest education level:  Not on file       Occupational History        ?  Not on file       Social Needs         ?  Financial resource strain:  Not on file        ?  Food insecurity              Worry:  Not on file         Inability:  Not on file        ?  Transportation needs              Medical:  Not on file         Non-medical:  Not on file       Tobacco Use         ?  Smoking status:  Never Smoker     ?  Smokeless tobacco:  Never Used       Substance and Sexual Activity         ?  Alcohol use:  No     ?  Drug use:  No     ?  Sexual activity:  Not on file       Lifestyle        ?  Physical activity              Days per week:  Not on file         Minutes per session:  Not on file         ?  Stress:  Not on file       Relationships        ?  Social Engineer, manufacturing systems on phone:  Not on file         Gets together:  Not on file  Attends religious service:  Not on file              Active member of club or  organization:  Not on file              Attends meetings of clubs or organizations:  Not on file         Relationship status:  Not on file        ?  Intimate partner violence              Fear of current or ex partner:  Not on file         Emotionally abused:  Not on file         Physically abused:  Not on file         Forced sexual activity:  Not on file        Other Topics  Concern        ?  Not on file       Social History Narrative        ?  Not on file          Family History         Problem  Relation  Age of Onset          ?  Kidney Disease  Mother       ?  Depression  Mother            ?  Heart Disease  Father            Allergies        Allergen  Reactions         ?  Augmentin [Amoxicillin-Pot Clavulanate]  Diarrhea         ?  Demerol [Meperidine]  Hives and Nausea and Vomiting          Current Outpatient Medications          Medication  Sig  Dispense  Refill           ?  potassium chloride SR (K-TAB) 20 mEq tablet  Take 1 Tab by mouth daily.  5 Tab  0     ?  baclofen (LIORESAL) 10 mg tablet  Take 10 mg by mouth three (3) times daily. Indications: as needed         ?  montelukast (SINGULAIR) 10 mg tablet  Take 10 mg by mouth daily. Indications: Takes generic         ?  Cetirizine (ZYRTEC) 10 mg cap  Take  by mouth.         ?  azelastine (ASTEPRO) 0.15 % (205.5 mcg)  2 Sprays two (2) times a day.         ?  fluticasone propionate (FLONASE) 50 mcg/actuation nasal spray  2 Sprays by Both Nostrils route daily.         ?  butalbital-acetaminophen-caffeine (FIORICET, ESGIC) 50-325-40 mg per tablet  Take 1 Tab by mouth.         ?  calcium-cholecalciferol, D3, (CALTRATE 600+D) tablet  Take 1 Tab by mouth daily.         ?  buPROPion XL (WELLBUTRIN XL) 300 mg XL tablet  Take 300 mg by mouth every morning.         ?  MODAFINIL (PROVIGIL PO)  Take  by mouth.         ?  gabapentin (NEURONTIN) 600 mg tablet  Take 300 mg by  mouth two (2) times a day. Indications: 300 twice daily as needed, 800 mg at bedtime         ?   topiramate (TOPAMAX) 100 mg tablet  Take 200 mg by mouth nightly.         ?  traZODone (DESYREL) 100 mg tablet  Take 100 mg by mouth nightly.         ?  dicyclomine (BENTYL) 20 mg tablet  Take 20 mg by mouth nightly.               ?  albuterol (PROVENTIL HFA, VENTOLIN HFA, PROAIR HFA) 90 mcg/actuation inhaler  Take 2 Puffs by inhalation every six (6) hours as needed for Wheezing.               Review of Systems   Constitutional: Fever: No   Skin: Rash: No   HEENT: Hearing difficulty: No   Eyes: Blurred vision: No   Cardiovascular: Chest pain: No   Respiratory: Shortness of breath: No   Gastrointestinal: Nausea/vomiting: Yes   Musculoskeletal: Back pain: Yes   Neurological: Weakness: No   Psychological: Memory loss: No   Comments/additional findings:             REVIEW OF LABS AND IMAGING:     No urine specimen.             A copy of today's office visit with all pertinent imaging results and labs were sent to the referring physician.     CC: Vivianne Master, MD       [name], MD                  This visit was conducted as a synchronous telemedicine service rendered via real-time interactive audio and video telecommunications system. On April 15, 2018, the World Health Organization  declared the COVID-19 (Novel Coronavirus) viral disease to be a pandemic. As a result of this emergency, a rapidly evolving situation, practice patterns for physicians, physician assistants, and nurse practitioners are shifting to accommodate the need  to treat in conjunction with unprecedented guidance from federal, state, and local authorities-- which include, but are not limited to, self-quarantines and/or limiting physical proximity to others under any number of circumstances.   It is within this context (and with the understanding that this method of patient encounter is in the patients best interest as well as the health and safety of other patients and the public) that telehealth is being provided  for this patient  encounter rather than a face-to-face visit. This patient encounter is appropriate and reasonable under the circumstances given the patients particular presentation at this time. The patient has been advised of the potential risks  and limitations of this mode of treatment (including, but not limited to, the absence of in-person examination) and has agreed to be treated in a remote fashion in spite of them. Any and all of the patients/patients familys questions  on this issue have been answered, and I have made no promises or guarantees to the patient. The patient has also been advised to contact this office for worsening conditions or problems, and seek emergency medical treatment and/or call 911 if the patient  deems either necessary.

## 2018-06-03 NOTE — Progress Notes (Signed)
Telemedicine Visit  [name] [name]  DOB 1970/11/09   Encounter Date: 06/03/2018       Encounter Diagnoses     ICD-10-CM ICD-9-CM   1. Kidney stones N20.0 592.0        ASSESSMENT:   Right distal ureteral stone 2mm causing refractory renal colic and unable to pass the stone.   -- 04/23/2018 s/p Cysto, Right URS, [facility]    Left kidney stone 5mm on 04/21/2018 CT scan    PLAN:    Stone prevention tips discussed:  A) Increase fluid consumption to make at least 2-2.5 litre of urine per day or consume at least 3 liters of fluid daily.  B) To decrease Calcium in urine, Decrease dietary Salt (or Sodium) intake to <3200 mg/day.  C) To decrease Oxalate in urine, Avoid dark drinks (ie, Coffee, Tea, Colas, etc), nuts, chocolate  D) To increase Citrate in urine, drink lemonade or and lemon juice to your water    F/U 6 months with KUB to assess for further stones.      Chief Complaint   Patient presents with   ??? Kidney Stone       HISTORY OF PRESENT ILLNESS:  [name] [name] is a 48 y.o. white female who presents in follow up for a Right distal ureteral stone 2mm causing refractory renal colic and unable to pass the stone.   04/23/2018 s/p Cysto, Right URS, [facility]  FINDINGS: Right distal ureteral stone 2mm, very soft -- crumbled during extraction.    >>>Pt removed stent w/ string at home w/ no difficulty.   Doing well currently but noted some mild right back pain for a few days after stent removal   -- does have fibromyalgia since age 48 yo.   No voiding difficulty.  No hematuria, dysuria.     04/21/2018 CT scan:  1. Obstructing stone right UVJ stone 2mm, moderate hydronephrosis.  2. Nonobstructing left nephrolithiasis, largest stone measures 5 mm.  3. Appendectomy and bilateral tubal ligation; IUD in satisfactory position.  2  PMHx, PSHx, SOCHx, FAMHx:  Unchanged as documented on 06/03/2018     Past Medical History:   Diagnosis Date    ??? Chronic kidney disease     stage 3   ??? Fibromyalgia    ??? Palate mass      Past Surgical History:   Procedure Laterality Date   ??? HX APPENDECTOMY     ??? HX CESAREAN SECTION     ??? HX HERNIA REPAIR      left   ??? HX OTHER SURGICAL      jaw surgery   ??? HX OVARIAN CYST REMOVAL     ??? HX SEPTOPLASTY     ??? HX TUBAL LIGATION       Social History     Socioeconomic History   ??? Marital status: MARRIED     Spouse name: Not on file   ??? Number of children: Not on file   ??? Years of education: Not on file   ??? Highest education level: Not on file   Occupational History   ??? Not on file   Social Needs   ??? Financial resource strain: Not on file   ??? Food insecurity     Worry: Not on file     Inability: Not on file   ??? Transportation needs     Medical: Not on file     Non-medical:  Not on file   Tobacco Use   ??? Smoking status: Never Smoker   ??? Smokeless tobacco: Never Used   Substance and Sexual Activity   ??? Alcohol use: No   ??? Drug use: No   ??? Sexual activity: Not on file   Lifestyle   ??? Physical activity     Days per week: Not on file     Minutes per session: Not on file   ??? Stress: Not on file   Relationships   ??? Social Wellsite geologist on phone: Not on file     Gets together: Not on file     Attends religious service: Not on file     Active member of club or organization: Not on file     Attends meetings of clubs or organizations: Not on file     Relationship status: Not on file   ??? Intimate partner violence     Fear of current or ex partner: Not on file     Emotionally abused: Not on file     Physically abused: Not on file     Forced sexual activity: Not on file   Other Topics Concern   ??? Not on file   Social History Narrative   ??? Not on file     Family History   Problem Relation Age of Onset   ??? Kidney Disease Mother    ??? Depression Mother    ??? Heart Disease Father      Allergies   Allergen Reactions   ??? Augmentin [Amoxicillin-Pot Clavulanate] Diarrhea   ??? Demerol [Meperidine] Hives and Nausea and Vomiting      Current Outpatient Medications   Medication Sig Dispense Refill   ??? potassium chloride SR (K-TAB) 20 mEq tablet Take 1 Tab by mouth daily. 5 Tab 0   ??? baclofen (LIORESAL) 10 mg tablet Take 10 mg by mouth three (3) times daily. Indications: as needed     ??? montelukast (SINGULAIR) 10 mg tablet Take 10 mg by mouth daily. Indications: Takes generic     ??? Cetirizine (ZYRTEC) 10 mg cap Take  by mouth.     ??? azelastine (ASTEPRO) 0.15 % (205.5 mcg) 2 Sprays two (2) times a day.     ??? fluticasone propionate (FLONASE) 50 mcg/actuation nasal spray 2 Sprays by Both Nostrils route daily.     ??? butalbital-acetaminophen-caffeine (FIORICET, ESGIC) 50-325-40 mg per tablet Take 1 Tab by mouth.     ??? calcium-cholecalciferol, D3, (CALTRATE 600+D) tablet Take 1 Tab by mouth daily.     ??? buPROPion XL (WELLBUTRIN XL) 300 mg XL tablet Take 300 mg by mouth every morning.     ??? MODAFINIL (PROVIGIL PO) Take  by mouth.     ??? gabapentin (NEURONTIN) 600 mg tablet Take 300 mg by mouth two (2) times a day. Indications: 300 twice daily as needed, 800 mg at bedtime     ??? topiramate (TOPAMAX) 100 mg tablet Take 200 mg by mouth nightly.     ??? traZODone (DESYREL) 100 mg tablet Take 100 mg by mouth nightly.     ??? dicyclomine (BENTYL) 20 mg tablet Take 20 mg by mouth nightly.     ??? albuterol (PROVENTIL HFA, VENTOLIN HFA, PROAIR HFA) 90 mcg/actuation inhaler Take 2 Puffs by inhalation every six (6) hours as needed for Wheezing.         Review of Systems  Constitutional: Fever: No  Skin: Rash: No  HEENT: Hearing difficulty: No  Eyes: Blurred vision: No  Cardiovascular: Chest pain: No  Respiratory: Shortness of breath: No  Gastrointestinal: Nausea/vomiting: Yes  Musculoskeletal: Back pain: Yes  Neurological: Weakness: No  Psychological: Memory loss: No  Comments/additional findings:         REVIEW OF LABS AND IMAGING:    No urine specimen.          A copy of today's office visit with all pertinent imaging results and labs were sent to the referring physician.    CC: Vivianne Master, MD     [name], MD            This visit was conducted as a synchronous telemedicine service rendered via real-time interactive audio and video telecommunications system. On April 15, 2018, the World Health Organization declared the COVID-19 (Novel Coronavirus) viral disease to be a pandemic. As a result of this emergency, a rapidly evolving situation, practice patterns for physicians, physician assistants, and nurse practitioners are shifting to accommodate the need to treat in conjunction with unprecedented guidance from federal, state, and local authorities??? which include, but are not limited to, self-quarantines and/or limiting physical proximity to others under any number of circumstances.  It is within this context (and with the understanding that this method of patient encounter is in the patient???s best interest as well as the health and safety of other patients and the public) that ???telehealth??? is being provided for this patient encounter rather than a face-to-face visit. This patient encounter is appropriate and reasonable under the circumstances given the patient???s particular presentation at this time. The patient has been advised of the potential risks and limitations of this mode of treatment (including, but not limited to, the absence of in-person examination) and has agreed to be treated in a remote fashion in spite of them. Any and all of the patient???s/patient???s family???s questions on this issue have been answered, and I have made no promises or guarantees to the patient. The patient has also been advised to contact this office for worsening conditions or problems, and seek emergency medical treatment and/or call 911 if the patient deems either necessary.

## 2018-08-05 ENCOUNTER — Ambulatory Visit
Admit: 2018-08-05 | Discharge: 2018-08-05 | Payer: PRIVATE HEALTH INSURANCE | Attending: Nurse Practitioner | Primary: Geriatric Medicine

## 2018-08-05 ENCOUNTER — Encounter: Payer: PRIVATE HEALTH INSURANCE | Primary: Geriatric Medicine

## 2018-08-05 ENCOUNTER — Ambulatory Visit: Attending: Nurse Practitioner | Primary: Geriatric Medicine

## 2018-08-05 DIAGNOSIS — N200 Calculus of kidney: Secondary | ICD-10-CM

## 2018-08-05 LAB — AMB POC URINALYSIS DIP STICK AUTO W/O MICRO
Bilirubin (UA POC): NEGATIVE
Bilirubin, Urine, POC: NEGATIVE
Glucose (UA POC): NEGATIVE
Glucose, Urine, POC: NEGATIVE
Ketones (UA POC): NEGATIVE
Ketones, Urine, POC: NEGATIVE
Leukocyte Esterase, Urine, POC: NEGATIVE
Leukocyte esterase (UA POC): NEGATIVE
Nitrite, Urine, POC: NEGATIVE
Nitrites (UA POC): NEGATIVE
Protein (UA POC): NEGATIVE
Protein, Urine, POC: NEGATIVE
Specific Gravity, Urine, POC: 1.03 NA (ref 1.001–1.035)
Specific gravity (UA POC): 1.03 (ref 1.001–1.035)
Urobilinogen (UA POC): 0.2 (ref 0.2–1)
Urobilinogen, POC: 0.2 (ref 0.2–1)
pH (UA POC): 7 (ref 4.6–8.0)
pH, Urine, POC: 7 NA (ref 4.6–8.0)

## 2018-08-05 MED ORDER — DOCUSATE SODIUM 100 MG CAP
100 mg | ORAL_CAPSULE | Freq: Two times a day (BID) | ORAL | 0 refills | Status: AC
Start: 2018-08-05 — End: 2018-09-04

## 2018-08-05 MED ORDER — OXYCODONE-ACETAMINOPHEN 5 MG-325 MG TAB
5-325 mg | ORAL_TABLET | Freq: Four times a day (QID) | ORAL | 0 refills | Status: AC | PRN
Start: 2018-08-05 — End: 2018-08-10

## 2018-08-05 MED ORDER — TAMSULOSIN SR 0.4 MG 24 HR CAP
0.4 mg | ORAL_CAPSULE | Freq: Every day | ORAL | 0 refills | Status: DC
Start: 2018-08-05 — End: 2019-08-17

## 2018-08-05 MED ORDER — ONDANSETRON HCL 4 MG TAB
4 mg | ORAL_TABLET | Freq: Three times a day (TID) | ORAL | 0 refills | Status: DC | PRN
Start: 2018-08-05 — End: 2019-08-17

## 2018-08-05 NOTE — Progress Notes (Signed)
Progress Notes by Marlyn Corporal, NP at 08/05/18 1420                Author: Marlyn Corporal, NP  Service: --  Author Type: Nurse Practitioner       Filed: 08/05/18 1807  Encounter Date: 08/05/2018  Status: Signed          Editor: Marlyn Corporal, NP (Nurse Practitioner)                    Baird Cancer   DOB 07-08-1970    Encounter Date: 08/05/2018            Encounter Diagnoses              ICD-10-CM  ICD-9-CM          1.  Kidney stones  N20.0  592.0            ASSESSMENT:    1.   Kidney Stone     Prior Surgery:  04/23/2018 s/p Cysto, Right URS, stone extraction and ureteral stent placement at [place] Analysis: none    Most Recent Imaging: KUB 08/05/2018- Possible 4.9m calcification overlying the left distal ureter: 04/21/2018 vCT abd/pelvis WO   Left kidney stone 551m    Medical Therapy: none    Metabolic Workup: none            PLAN:     ??  Reviewed KUB imaging with patient. It appears that she has a 30m29mtone in her left distal ureter.    ??  We discussed the options for management of kidney/ureteral stones, including  medical expulsion therapy (MET), ESWL, ureteroscopy with laser lithotripsy, and PCNL.  The risks and benefits of each option were discussed  with patient.  She would like to continue medical expulsion therapy.    ??  Send urine for culture- abnormal UA   ??  Percocet 325/5 mg every 8 hours prn for pain,. Colace 100m97mice a day. Take while taking narcotics. Hold for loose stools. Tamsulosin 0.4mg 31mly. Ses discussed. Zofran 4mg p2mevery 8 hours for nausea. SEs discussed    ??  RUS in 4 weeks with f/u with Dr Kwong Rozelle Logan Discussed reason to go to the ER including fever, chills, uncontrollable n/v, and uncontrollable pain             DISCUSSION:    ESWL: risks and  benefits of ESWL were outlined including infection, bleeding, pain, steinstrasse, kidney injury, need for ancillary treatments, and global anesthesia risks including but not limited to CVA, MI, DVT, PE, pneumonia, and  death.        Ureteroscopy:  risks and benefits of ureteroscopy were outlined, including infection, bleeding, pain, temporary ureteral stent and associated stent bother, ureteral injury, ureteral stricture, need for ancillary treatments, and global anesthesia risks including but  not limited to CVA, MI, DVT, PE, pneumonia, and death.      Medical Expulsion Therapy : oral analgesics and medical expulsive therapy with an alpha blocker will be provided to assist with stone passage.  The patient is instructed to return or go to ER for emergent intervention  if intolerable pain, intractable nausea/vomiting, or fever develops.            Chief Complaint       Patient presents with        ?  Kidney Stone  Pt here for back pain w/ hx of kidney stones           HISTORY OF PRESENT ILLNESS:  [name] [name]  is a 48 y.o. white female who  presents in follow up for flank pain. She is s/p Right URS and Laser lithotripsy by Dr Rozelle Logan on 04/23/2018. She woke up this am with left flank pain, with known hx of left renal stone.       Denies gross hematuria    Reports dysuria    No nausea and vomiting    Reports she woke up with left-sided flank pain that extends into her abdomen   Denies f,c,v- some nausea   Reports increase in frequency          IMAGING:       04/23/2018 s/p Cysto, Right URS, stone extraction and ureteral stent placement at [facility]   FINDINGS: Right distal ureteral stone 31m, very soft -- crumbled during extraction.      KUB 08/05/2018   Impression:    Possible 4.674mcalcification overlying the left distal ureter   No stones visualized in bilateral kidneys    IUD noted in pelvis.    Surgical hardware noted in pelvis          04/21/2018 CT scan:   1. Obstructing stone right UVJ stone 44m68mmoderate hydronephrosis.   2. Nonobstructing left nephrolithiasis, largest stone measures 5 mm.   3. Appendectomy and bilateral tubal ligation; IUD in satisfactory position.   2   PMHx, PSHx, SOCHx, FAMHx:   Unchanged as  documented on 08/05/2018         Past Medical History:        Diagnosis  Date         ?  Chronic kidney disease            stage 3         ?  Fibromyalgia           ?  Palate mass            Past Surgical History:         Procedure  Laterality  Date          ?  HX APPENDECTOMY         ?  HX CESAREAN SECTION         ?  HX HERNIA REPAIR              left          ?  HX OTHER SURGICAL              jaw surgery          ?  HX OVARIAN CYST REMOVAL         ?  HX SEPTOPLASTY              ?  HX TUBAL LIGATION              Social History          Socioeconomic History         ?  Marital status:  MARRIED              Spouse name:  Not on file         ?  Number of children:  Not on file     ?  Years of education:  Not on file     ?  Highest education level:  Not on file  Occupational History        ?  Not on file       Social Needs         ?  Financial resource strain:  Not on file        ?  Food insecurity              Worry:  Not on file         Inability:  Not on file        ?  Transportation needs              Medical:  Not on file         Non-medical:  Not on file       Tobacco Use         ?  Smoking status:  Never Smoker     ?  Smokeless tobacco:  Never Used       Substance and Sexual Activity         ?  Alcohol use:  No     ?  Drug use:  No     ?  Sexual activity:  Not on file       Lifestyle        ?  Physical activity              Days per week:  Not on file         Minutes per session:  Not on file         ?  Stress:  Not on file       Relationships        ?  Social Health visitor on phone:  Not on file         Gets together:  Not on file         Attends religious service:  Not on file         Active member of club or organization:  Not on file         Attends meetings of clubs or organizations:  Not on file         Relationship status:  Not on file        ?  Intimate partner violence              Fear of current or ex partner:  Not on file         Emotionally abused:  Not on file         Physically  abused:  Not on file         Forced sexual activity:  Not on file        Other Topics  Concern        ?  Not on file       Social History Narrative        ?  Not on file          Family History         Problem  Relation  Age of Onset          ?  Kidney Disease  Mother       ?  Depression  Mother            ?  Heart Disease  Father            Allergies        Allergen  Reactions         ?  Augmentin [Amoxicillin-Pot Clavulanate]  Diarrhea         ?  Demerol [Meperidine]  Hives and Nausea and Vomiting          Current Outpatient Medications          Medication  Sig  Dispense  Refill           ?  oxyCODONE-acetaminophen (PERCOCET) 5-325 mg per tablet  Take 1 Tab by mouth every six (6) hours as needed for Pain for up to 5 days. Max Daily Amount: 4 Tabs.  15 Tab  0     ?  docusate sodium (COLACE) 100 mg capsule  Take 1 Cap by mouth two (2) times a day for 30 days. Take twice a day while on narcotics. Hold for loose stools.  60 Cap  0     ?  ondansetron hcl (ZOFRAN) 4 mg tablet  Take 1 Tab by mouth every eight (8) hours as needed for Nausea or Vomiting.  24 Tab  0     ?  tamsulosin (FLOMAX) 0.4 mg capsule  Take 1 Cap by mouth daily (after dinner).  60 Cap  0     ?  potassium chloride SR (K-TAB) 20 mEq tablet  Take 1 Tab by mouth daily.  5 Tab  0     ?  baclofen (LIORESAL) 10 mg tablet  Take 10 mg by mouth three (3) times daily. Indications: as needed         ?  montelukast (SINGULAIR) 10 mg tablet  Take 10 mg by mouth daily. Indications: Takes generic         ?  Cetirizine (ZYRTEC) 10 mg cap  Take  by mouth.         ?  azelastine (ASTEPRO) 0.15 % (205.5 mcg)  2 Sprays two (2) times a day.         ?  fluticasone propionate (FLONASE) 50 mcg/actuation nasal spray  2 Sprays by Both Nostrils route daily.         ?  butalbital-acetaminophen-caffeine (FIORICET, ESGIC) 50-325-40 mg per tablet  Take 1 Tab by mouth.         ?  calcium-cholecalciferol, D3, (CALTRATE 600+D) tablet  Take 1 Tab by mouth daily.         ?  buPROPion XL  (WELLBUTRIN XL) 300 mg XL tablet  Take 300 mg by mouth every morning.         ?  MODAFINIL (PROVIGIL PO)  Take  by mouth.         ?  gabapentin (NEURONTIN) 600 mg tablet  Take 300 mg by mouth two (2) times a day. Indications: 300 twice daily as needed, 800 mg at bedtime         ?  topiramate (TOPAMAX) 100 mg tablet  Take 200 mg by mouth nightly.               ?  traZODone (DESYREL) 100 mg tablet  Take 100 mg by mouth nightly.               ?  dicyclomine (BENTYL) 20 mg tablet  Take 20 mg by mouth nightly.               ?  albuterol (PROVENTIL HFA, VENTOLIN HFA, PROAIR HFA) 90 mcg/actuation inhaler  Take 2 Puffs by inhalation every six (6) hours as needed for Wheezing.  Review of Systems   Constitutional: Fever: No   Skin: Rash: No   HEENT: Hearing difficulty: No   Eyes: Blurred vision: No   Cardiovascular: Chest pain: No   Respiratory: Shortness of breath: No   Gastrointestinal: Nausea/vomiting: No   Musculoskeletal: Back pain: Yes   Neurological: Weakness: No   Psychological: Memory loss: No   Comments/additional findings:             REVIEW OF LABS AND IMAGING:        CT scan .   UA             A copy of today's office visit with all pertinent imaging results and labs were sent to the referring physician.     CC: Marlowe Kays, MD             Marlyn Corporal, NP-C   [facility] [place] [address]    Phone: [phone]     Fax: [phone]      [address] [place], [address]   [place], [place]   P: [phone]    F: [phone]

## 2018-08-05 NOTE — Progress Notes (Signed)
KUB performed today per S. Anderson.

## 2018-08-05 NOTE — Progress Notes (Signed)
 MRI/CT Scheduling Request    Best Contact Number(s):  [phone]    Date of patient's next appointment w/physician: 09/03/2018    Date physician would like test done by: RUS at MRI/CT [place] next available

## 2018-08-05 NOTE — Progress Notes (Signed)
MRI/CT Scheduling Request    Best Contact Number(s):  [phone]    Date of patient???s next appointment w/physician: 09/03/2018    Date physician would like test done by: RUS at MRI/CT [place] next available

## 2018-08-05 NOTE — Progress Notes (Signed)
[name] [name]  DOB 1970-07-19   Encounter Date: 08/05/2018       Encounter Diagnoses     ICD-10-CM ICD-9-CM   1. Kidney stones N20.0 592.0        ASSESSMENT:   1.   Kidney Stone    Prior Surgery:  04/23/2018 s/p Cysto, Right URS, stone extraction and ureteral stent placement at [place] Analysis: none   Most Recent Imaging: [facility] 08/05/2018- Possible 4.30m calcification overlying the left distal ureter: 04/21/2018 vCT abd/pelvis WO  Left kidney stone 592m   Medical Therapy: none   Metabolic Workup: none         PLAN:    ?? Reviewed [facility] imaging with patient. It appears that she has a 101m27mtone in her left distal ureter.   ?? We discussed the options for management of kidney/ureteral stones, including medical expulsion therapy (MET), ESWL, ureteroscopy with laser lithotripsy, and PCNL.  The risks and benefits of each option were discussed with patient.  She would like to continue medical expulsion therapy.   ?? Send urine for culture- abnormal UA  ?? Percocet 325/5 mg every 8 hours prn for pain,. Colace 100m8101mice a day. Take while taking narcotics. Hold for loose stools. Tamsulosin 0.4mg 67mly. Ses discussed. Zofran 4mg p83mevery 8 hours for nausea. SEs discussed   ?? RUS in 4 weeks with f/u with Dr Kwong Rozelle Loganiscussed reason to go to the ER including fever, chills, uncontrollable n/v, and uncontrollable pain         DISCUSSION:   ESWL: risks and benefits of ESWL were outlined including infection, bleeding, pain, steinstrasse, kidney injury, need for ancillary treatments, and global anesthesia risks including but not limited to CVA, MI, DVT, PE, pneumonia, and death.      Ureteroscopy: risks and benefits of ureteroscopy were outlined, including infection, bleeding, pain, temporary ureteral stent and associated stent bother, ureteral injury, ureteral stricture, need for ancillary treatments, and global anesthesia risks including but not limited to CVA, MI, DVT, PE, pneumonia, and death.     Medical Expulsion Therapy: oral analgesics and medical expulsive therapy with an alpha blocker will be provided to assist with stone passage.  The patient is instructed to return or go to ER for emergent intervention if intolerable pain, intractable nausea/vomiting, or fever develops.       Chief Complaint   Patient presents with   ??? Kidney Stone     Pt here for back pain w/ hx of kidney stones       HISTORY OF PRESENT ILLNESS:  [name] B Ringwald Rasool48 y.o110white female who presents in follow up for flank pain. She is s/p Right URS and Laser lithotripsy by Dr Kwong Rozelle Logan/19/2020. She woke up this am with left flank pain, with known hx of left renal stone.     Denies gross hematuria   Reports dysuria   No nausea and vomiting   Reports she woke up with left-sided flank pain that extends into her abdomen  Denies f,c,v- some nausea  Reports increase in frequency       IMAGING:     04/23/2018 s/p Cysto, Right URS, stone [facility]  [facility] 2mm, v30m soft -- crumbled during extraction.    [facility] 08/05/2018  Impression:   Possible 4.6mm cal1101mication overlying the left distal ureter  No stones visualized in bilateral kidneys   IUD noted in pelvis.  Surgical hardware noted in pelvis       04/21/2018 CT scan:  1. Obstructing stone right UVJ stone 52m, moderate hydronephrosis.  2. Nonobstructing left nephrolithiasis, largest stone measures 5 mm.  3. Appendectomy and bilateral tubal ligation; IUD in satisfactory position.  2  PMHx, PSHx, SOCHx, FAMHx:  Unchanged as documented on 08/05/2018     Past Medical History:   Diagnosis Date   ??? Chronic kidney disease     stage 3   ??? Fibromyalgia    ??? Palate mass      Past Surgical History:   Procedure Laterality Date   ??? HX APPENDECTOMY     ??? HX CESAREAN SECTION     ??? HX HERNIA REPAIR      left   ??? HX OTHER SURGICAL      jaw surgery   ??? HX OVARIAN CYST REMOVAL     ??? HX SEPTOPLASTY     ??? HX TUBAL LIGATION       Social History      Socioeconomic History   ??? Marital status: MARRIED     Spouse name: Not on file   ??? Number of children: Not on file   ??? Years of education: Not on file   ??? Highest education level: Not on file   Occupational History   ??? Not on file   Social Needs   ??? Financial resource strain: Not on file   ??? Food insecurity     Worry: Not on file     Inability: Not on file   ??? Transportation needs     Medical: Not on file     Non-medical: Not on file   Tobacco Use   ??? Smoking status: Never Smoker   ??? Smokeless tobacco: Never Used   Substance and Sexual Activity   ??? Alcohol use: No   ??? Drug use: No   ??? Sexual activity: Not on file   Lifestyle   ??? Physical activity     Days per week: Not on file     Minutes per session: Not on file   ??? Stress: Not on file   Relationships   ??? Social cProduct manageron phone: Not on file     Gets together: Not on file     Attends religious service: Not on file     Active member of club or organization: Not on file     Attends meetings of clubs or organizations: Not on file     Relationship status: Not on file   ??? Intimate partner violence     Fear of current or ex partner: Not on file     Emotionally abused: Not on file     Physically abused: Not on file     Forced sexual activity: Not on file   Other Topics Concern   ??? Not on file   Social History Narrative   ??? Not on file     Family History   Problem Relation Age of Onset   ??? Kidney Disease Mother    ??? Depression Mother    ??? Heart Disease Father      Allergies   Allergen Reactions   ??? Augmentin [Amoxicillin-Pot Clavulanate] Diarrhea   ??? Demerol [Meperidine] Hives and Nausea and Vomiting     Current Outpatient Medications   Medication Sig Dispense Refill   ??? oxyCODONE-acetaminophen (PERCOCET) 5-325 mg per tablet Take 1 Tab by mouth every six (6) hours as needed  for Pain for up to 5 days. Max Daily Amount: 4 Tabs. 15 Tab 0    ??? docusate sodium (COLACE) 100 mg capsule Take 1 Cap by mouth two (2) times a day for 30 days. Take twice a day while on narcotics. Hold for loose stools. 60 Cap 0   ??? ondansetron hcl (ZOFRAN) 4 mg tablet Take 1 Tab by mouth every eight (8) hours as needed for Nausea or Vomiting. 24 Tab 0   ??? tamsulosin (FLOMAX) 0.4 mg capsule Take 1 Cap by mouth daily (after dinner). 60 Cap 0   ??? potassium chloride SR (K-TAB) 20 mEq tablet Take 1 Tab by mouth daily. 5 Tab 0   ??? baclofen (LIORESAL) 10 mg tablet Take 10 mg by mouth three (3) times daily. Indications: as needed     ??? montelukast (SINGULAIR) 10 mg tablet Take 10 mg by mouth daily. Indications: Takes generic     ??? Cetirizine (ZYRTEC) 10 mg cap Take  by mouth.     ??? azelastine (ASTEPRO) 0.15 % (205.5 mcg) 2 Sprays two (2) times a day.     ??? fluticasone propionate (FLONASE) 50 mcg/actuation nasal spray 2 Sprays by Both Nostrils route daily.     ??? butalbital-acetaminophen-caffeine (FIORICET, ESGIC) 50-325-40 mg per tablet Take 1 Tab by mouth.     ??? calcium-cholecalciferol, D3, (CALTRATE 600+D) tablet Take 1 Tab by mouth daily.     ??? buPROPion XL (WELLBUTRIN XL) 300 mg XL tablet Take 300 mg by mouth every morning.     ??? MODAFINIL (PROVIGIL PO) Take  by mouth.     ??? gabapentin (NEURONTIN) 600 mg tablet Take 300 mg by mouth two (2) times a day. Indications: 300 twice daily as needed, 800 mg at bedtime     ??? topiramate (TOPAMAX) 100 mg tablet Take 200 mg by mouth nightly.     ??? traZODone (DESYREL) 100 mg tablet Take 100 mg by mouth nightly.     ??? dicyclomine (BENTYL) 20 mg tablet Take 20 mg by mouth nightly.     ??? albuterol (PROVENTIL HFA, VENTOLIN HFA, PROAIR HFA) 90 mcg/actuation inhaler Take 2 Puffs by inhalation every six (6) hours as needed for Wheezing.         Review of Systems  Constitutional: Fever: No  Skin: Rash: No  HEENT: Hearing difficulty: No  Eyes: Blurred vision: No  Cardiovascular: Chest pain: No  Respiratory: Shortness of breath: No   Gastrointestinal: Nausea/vomiting: No  Musculoskeletal: Back pain: Yes  Neurological: Weakness: No  Psychological: Memory loss: No  Comments/additional findings:         REVIEW OF LABS AND IMAGING:      CT scan .  UA         A copy of today's office visit with all pertinent imaging results and labs were sent to the referring physician.    CC: Marlowe Kays, MD         Marlyn Corporal, NP-C  [facility] [place] [address]   Phone: [phone]    Fax: [phone]    [address] [place], [address]  [place], [place]  P: [phone]   F: [phone]

## 2018-08-07 LAB — CULTURE, URINE
Urine Culture, Routine: NO GROWTH
Urine Culture, Routine: NO GROWTH

## 2018-08-12 NOTE — Progress Notes (Signed)
Patient is scheduled with MRI-CT [facility] 08/24/18 at 10:30am.

## 2018-08-12 NOTE — Progress Notes (Signed)
Patient is scheduled with MRI-CT [place] 08/24/18 at 10:30am.

## 2018-08-24 ENCOUNTER — Encounter

## 2018-09-03 ENCOUNTER — Ambulatory Visit
Admit: 2018-09-03 | Discharge: 2018-09-03 | Payer: PRIVATE HEALTH INSURANCE | Attending: Urology | Primary: Geriatric Medicine

## 2018-09-03 ENCOUNTER — Ambulatory Visit: Attending: Urology | Primary: Geriatric Medicine

## 2018-09-03 DIAGNOSIS — N201 Calculus of ureter: Secondary | ICD-10-CM

## 2018-09-03 LAB — AMB POC URINALYSIS DIP STICK AUTO W/O MICRO
Bilirubin (UA POC): NEGATIVE
Bilirubin, Urine, POC: NEGATIVE
Blood (UA POC): NEGATIVE
Blood (UA POC): NEGATIVE
Glucose (UA POC): NEGATIVE
Glucose, Urine, POC: NEGATIVE
Ketones (UA POC): NEGATIVE
Ketones, Urine, POC: NEGATIVE
Leukocyte Esterase, Urine, POC: NEGATIVE
Leukocyte esterase (UA POC): NEGATIVE
Nitrite, Urine, POC: NEGATIVE
Nitrites (UA POC): NEGATIVE
Protein (UA POC): NEGATIVE
Protein, Urine, POC: NEGATIVE
Specific Gravity, Urine, POC: 1.02 NA (ref 1.001–1.035)
Specific gravity (UA POC): 1.02 (ref 1.001–1.035)
Urobilinogen (UA POC): 0.2 (ref 0.2–1)
Urobilinogen, POC: 0.2 (ref 0.2–1)
pH (UA POC): 7 (ref 4.6–8.0)
pH, Urine, POC: 7 NA (ref 4.6–8.0)

## 2018-09-03 NOTE — Progress Notes (Signed)
Progress  Notes by Etheleen NicksKwong, Shain Pauwels O, MD at 09/03/18 1300                Author: Etheleen NicksKwong, Klair Leising O, MD  Service: --  Author Type: Physician       Filed: 09/03/18 1340  Encounter Date: 09/03/2018  Status: Signed          Editor: Etheleen NicksKwong, Roselinda Bahena O, MD (Physician)                       [name] [name]   DOB March 13, 1970    Encounter Date: 09/03/2018            Encounter Diagnoses              ICD-10-CM  ICD-9-CM          1.  Left ureteral stone   N20.1  592.1          2.  Kidney stones   N20.0  592.0            ASSESSMENT:    Left ureteral stone 5mm -- passed 08/20/2018      Right distal ureteral stone 2mm causing refractory renal colic and unable to pass the stone.    -- 04/23/2018 s/p Cysto, Right URS, [facility]      h/o Fibromyalgia since age 48 yo.       PLAN:     Stone sent for [employer] analysis      Stone prevention tips discussed:   A) Increase fluid consumption to make at least 2-2.5 litre of urine per day or consume at least 3 liters of fluid daily.   B) To decrease Calcium in urine, Decrease dietary Salt (or Sodium) intake to <3200 mg/day.   C) To decrease Oxalate in urine, Avoid dark drinks (ie, Coffee, Tea, Colas, etc), nuts, chocolate   D) To increase Citrate in urine, drink lemonade or and lemon juice to your water      F/U 9 months with [facility] to assess for further stones.        Chief Complaint       Patient presents with        ?  Kidney Stone             Pt here for a F/U Pt passed a stone and brought it into office today,Pt here to go over US resutls            HISTORY OF PRESENT ILLNESS:  [name] [name]  is a 48 y.o. white female who  presents in follow up for a left ureteral stone.   08/05/2018 pt seen by Shon BatonSarah Anderson, NP for new left flank pain   08/05/2018 [facility] showed a possible 4.866mm calcification overlying the left distal ureter      >>Doing well, passed stone on Thursday 08/20/2018 -- brought in stone today   Currently still having some mild RUQ abd pain, related to  eating   No f/c/n/v.  No voiding difficulty, dysuria, hematuria         March 2020 pt had a Right distal ureteral stone 2mm causing refractory renal colic and  unable to pass the stone.    04/23/2018 s/p Cysto, Right URS, stone extraction and [facility]   FINDINGS: Right distal ureteral stone 2mm, very soft --  crumbled during extraction.      Has h/o fibromyalgia since age 48 yo.  No voiding difficulty.  No hematuria, dysuria.       08/24/2018 Renal US:   Mild bilateral hydronephrosis. ??No visualized nephrolithiasis on this modality. Please see report for additional findings and details.    Bilateral ureteral jets were observed.      04/21/2018 CT scan:   1. Obstructing stone right UVJ stone 2mm, moderate hydronephrosis.   2. Nonobstructing left nephrolithiasis, largest stone measures 5 mm.   3. Appendectomy and bilateral tubal ligation; IUD in satisfactory position.            PMH/PSH/SocHx/FamHx/ Meds & allergies reviewed.      Past Medical History:        Diagnosis  Date         ?  Chronic kidney disease            stage 3         ?  Fibromyalgia           ?  Palate mass            Past Surgical History:         Procedure  Laterality  Date          ?  HX APPENDECTOMY         ?  HX CESAREAN SECTION         ?  HX HERNIA REPAIR              left          ?  HX OTHER SURGICAL              jaw surgery          ?  HX OVARIAN CYST REMOVAL         ?  HX SEPTOPLASTY              ?  HX TUBAL LIGATION              Social History          Socioeconomic History         ?  Marital status:  MARRIED              Spouse name:  Not on file         ?  Number of children:  Not on file     ?  Years of education:  Not on file     ?  Highest education level:  Not on file       Occupational History        ?  Not on file       Social Needs         ?  Financial resource strain:  Not on file        ?  Food insecurity              Worry:  Not on file         Inability:  Not on file        ?  Transportation needs               Medical:  Not on file         Non-medical:  Not on file       Tobacco Use         ?  Smoking status:  Never Smoker     ?  Smokeless tobacco:  Never Used       Substance and Sexual Activity         ?  Alcohol use:  No     ?  Drug use:  No     ?  Sexual activity:  Not on file       Lifestyle        ?  Physical activity              Days per week:  Not on file         Minutes per session:  Not on file         ?  Stress:  Not on file       Relationships        ?  Social Engineer, manufacturing systemsconnections              Talks on phone:  Not on file         Gets together:  Not on file         Attends religious service:  Not on file         Active member of club or organization:  Not on file         Attends meetings of clubs or organizations:  Not on file         Relationship status:  Not on file        ?  Intimate partner violence              Fear of current or ex partner:  Not on file         Emotionally abused:  Not on file         Physically abused:  Not on file         Forced sexual activity:  Not on file        Other Topics  Concern        ?  Not on file       Social History Narrative        ?  Not on file          Family History         Problem  Relation  Age of Onset          ?  Kidney Disease  Mother       ?  Depression  Mother            ?  Heart Disease  Father            Allergies        Allergen  Reactions         ?  Augmentin [Amoxicillin-Pot Clavulanate]  Diarrhea         ?  Demerol [Meperidine]  Hives and Nausea and Vomiting          Current Outpatient Medications on File Prior to Visit          Medication  Sig  Dispense  Refill           ?  fexofenadine HCl (ALLEGRA PO)  Take  by mouth.         ?  baclofen (LIORESAL) 10 mg tablet  Take 10 mg by mouth three (3) times daily. Indications: as needed         ?  azelastine (ASTEPRO) 0.15 % (205.5 mcg)  2 Sprays two (2) times a day.         ?  fluticasone propionate (FLONASE) 50 mcg/actuation nasal spray  2 Sprays by Both Nostrils route daily.         ?   butalbital-acetaminophen-caffeine (  FIORICET, ESGIC) 50-325-40 mg per tablet  Take 1 Tab by mouth.               ?  calcium-cholecalciferol, D3, (CALTRATE 600+D) tablet  Take 1 Tab by mouth daily.               ?  buPROPion XL (WELLBUTRIN XL) 300 mg XL tablet  Take 300 mg by mouth every morning.         ?  MODAFINIL (PROVIGIL PO)  Take  by mouth.         ?  gabapentin (NEURONTIN) 600 mg tablet  Take 300 mg by mouth two (2) times a day. Indications: 300 twice daily as needed, 800 mg at bedtime         ?  topiramate (TOPAMAX) 100 mg tablet  Take 200 mg by mouth nightly.         ?  traZODone (DESYREL) 100 mg tablet  Take 100 mg by mouth nightly.         ?  dicyclomine (BENTYL) 20 mg tablet  Take 20 mg by mouth nightly.         ?  albuterol (PROVENTIL HFA, VENTOLIN HFA, PROAIR HFA) 90 mcg/actuation inhaler  Take 2 Puffs by inhalation every six (6) hours as needed for Wheezing.         ?  docusate sodium (COLACE) 100 mg capsule  Take 1 Cap by mouth two (2) times a day for 30 days. Take twice a day while on narcotics. Hold for loose stools.  60 Cap  0     ?  ondansetron hcl (ZOFRAN) 4 mg tablet  Take 1 Tab by mouth every eight (8) hours as needed for Nausea or Vomiting.  24 Tab  0     ?  tamsulosin (FLOMAX) 0.4 mg capsule  Take 1 Cap by mouth daily (after dinner).  60 Cap  0           ?  potassium chloride SR (K-TAB) 20 mEq tablet  Take 1 Tab by mouth daily.  5 Tab  0          No current facility-administered medications on file prior to visit.         Urinalysis / Labs:      Results for orders placed or performed in visit on 09/03/18     AMB POC URINALYSIS DIP STICK AUTO W/O MICRO         Result  Value  Ref Range            Color (UA POC)  Yellow         Clarity (UA POC)  Clear         Glucose (UA POC)  Negative  Negative       Bilirubin (UA POC)  Negative  Negative       Ketones (UA POC)  Negative  Negative       Specific gravity (UA POC)  1.020  1.001 - 1.035       Blood (UA POC)  Negative  Negative       pH (UA POC)   7.0  4.6 - 8.0       Protein (UA POC)  Negative  Negative       Urobilinogen (UA POC)  0.2 mg/dL  0.2 - 1       Nitrites (UA POC)  Negative  Negative            Leukocyte  esterase (UA POC)  Negative  Negative        Physical Exam:   Visit Vitals      Ht  4\' 11"  (1.499 m)     Wt  109 lb (49.4 kg)        BMI  22.02 kg/m??        Constitutional: Well developed, well-nourished female in no acute distress.    CV:  RRR   Respiratory: No respiratory distress or difficulties   Abdomen:  Soft and nontender. No masses.     GU: Bladder nonpalpable.  No CVAT   Extremities: No peripheral swelling noted   MSK: FROM   Skin: No bruising or rashes.       Neuro/Psych:  Patient with appropriate affect.  Alert and oriented x 3.           Review of Systems   Constitutional: Fever: No   Skin: Rash: No   HEENT: Hearing difficulty: No   Eyes: Blurred vision: No   Cardiovascular: Chest pain: No   Respiratory: Shortness of breath: No   Gastrointestinal: Nausea/vomiting: Yes   Musculoskeletal: Back pain: Yes   Neurological: Weakness: No   Psychological: Memory loss: No   Comments/additional findings:                A copy of today's office visit with all pertinent imaging results and labs were sent to the referring physician.     CC: Marlowe Kays, MD       Mills Koller, MD

## 2018-09-03 NOTE — Progress Notes (Signed)
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  Continue with the stone prevention measures that we discussed in past visit (especially drinking lots of water to make urine very diluted).  Thanks.

## 2018-09-03 NOTE — Patient Instructions (Signed)
Stone prevention tips:  A) Increase fluid consumption to make at least 2-2.5 litre of urine per day or consume at least 3 liters of fluid daily.  B) To decrease Calcium in urine, Decrease dietary Salt (or Sodium) intake to <3200 mg/day.  C) To decrease Oxalate in urine, Avoid dark drinks (ie, Coffee, Tea, Colas, etc), nuts, chocolate  D) To increase Citrate in urine, drink lemonade or and lemon juice to your water

## 2018-09-03 NOTE — Progress Notes (Signed)
[name] [name]  DOB 07-06-1970   Encounter Date: 09/03/2018       Encounter Diagnoses     ICD-10-CM ICD-9-CM   1. Left ureteral stone  N20.1 592.1   2. Kidney stones  N20.0 592.0        ASSESSMENT:   Left ureteral stone 5mm -- passed 08/20/2018    Right distal ureteral stone 2mm causing refractory renal colic and unable to pass the stone.   -- 04/23/2018 s/p Cysto, Right URS, stone extraction and ureteral stent placement at [facility]    h/o Fibromyalgia since age 48 yo.     PLAN:    Stone sent for [employer] analysis    Stone prevention tips discussed:  A) Increase fluid consumption to make at least 2-2.5 litre of urine per day or consume at least 3 liters of fluid daily.  B) To decrease Calcium in urine, Decrease dietary Salt (or Sodium) intake to <3200 mg/day.  C) To decrease Oxalate in urine, Avoid dark drinks (ie, Coffee, Tea, Colas, etc), nuts, chocolate  D) To increase Citrate in urine, drink lemonade or and lemon juice to your water    F/U 9 months with KUB to assess for further stones.    Chief Complaint   Patient presents with   ??? Kidney Stone     Pt here for a F/U Pt passed a stone and brought it into office today,Pt here to go over US resutls        HISTORY OF PRESENT ILLNESS:  [name] [name] is a 48 y.o. white female who presents in follow up for a left ureteral stone.  08/05/2018 pt seen by Shon BatonSarah Anderson, NP for new left flank pain  08/05/2018 KUB showed a possible 4.226mm calcification overlying the left distal ureter    >>Doing well, passed stone on Thursday 08/20/2018 -- brought in stone today  Currently still having some mild RUQ abd pain, related to eating  No f/c/n/v.  No voiding difficulty, dysuria, hematuria      March 2020 pt had a Right distal ureteral stone 2mm causing refractory renal colic and unable to pass the stone.   04/23/2018 s/p Cysto, Right URS, stone extraction and ureteral stent placement at [facility]  FINDINGS: Right distal ureteral stone 2mm, very soft -- crumbled during extraction.     Has h/o fibromyalgia since age 48 yo.   No voiding difficulty.  No hematuria, dysuria.     08/24/2018 Renal US:  Mild bilateral hydronephrosis. ??No visualized nephrolithiasis on this modality. Please see report for additional findings and details.   Bilateral ureteral jets were observed.    04/21/2018 CT scan:  1. Obstructing stone right UVJ stone 2mm, moderate hydronephrosis.  2. Nonobstructing left nephrolithiasis, largest stone measures 5 mm.  3. Appendectomy and bilateral tubal ligation; IUD in satisfactory position.        PMH/PSH/SocHx/FamHx/Meds & allergies reviewed.   Past Medical History:   Diagnosis Date   ??? Chronic kidney disease     stage 3   ??? Fibromyalgia    ??? Palate mass      Past Surgical History:   Procedure Laterality Date   ??? HX APPENDECTOMY     ??? HX CESAREAN SECTION     ??? HX HERNIA REPAIR      left   ??? HX OTHER SURGICAL      jaw surgery   ??? HX OVARIAN CYST REMOVAL     ??? HX SEPTOPLASTY     ??? HX TUBAL LIGATION  Social History     Socioeconomic History   ??? Marital status: MARRIED     Spouse name: Not on file   ??? Number of children: Not on file   ??? Years of education: Not on file   ??? Highest education level: Not on file   Occupational History   ??? Not on file   Social Needs   ??? Financial resource strain: Not on file   ??? Food insecurity     Worry: Not on file     Inability: Not on file   ??? Transportation needs     Medical: Not on file     Non-medical: Not on file   Tobacco Use   ??? Smoking status: Never Smoker   ??? Smokeless tobacco: Never Used   Substance and Sexual Activity   ??? Alcohol use: No   ??? Drug use: No   ??? Sexual activity: Not on file   Lifestyle   ??? Physical activity     Days per week: Not on file     Minutes per session: Not on file   ??? Stress: Not on file   Relationships   ??? Social Wellsite geologist on phone: Not on file     Gets together: Not on file     Attends religious service: Not on file     Active member of club or organization: Not on file      Attends meetings of clubs or organizations: Not on file     Relationship status: Not on file   ??? Intimate partner violence     Fear of current or ex partner: Not on file     Emotionally abused: Not on file     Physically abused: Not on file     Forced sexual activity: Not on file   Other Topics Concern   ??? Not on file   Social History Narrative   ??? Not on file     Family History   Problem Relation Age of Onset   ??? Kidney Disease Mother    ??? Depression Mother    ??? Heart Disease Father      Allergies   Allergen Reactions   ??? Augmentin [Amoxicillin-Pot Clavulanate] Diarrhea   ??? Demerol [Meperidine] Hives and Nausea and Vomiting     Current Outpatient Medications on File Prior to Visit   Medication Sig Dispense Refill   ??? fexofenadine HCl (ALLEGRA PO) Take  by mouth.     ??? baclofen (LIORESAL) 10 mg tablet Take 10 mg by mouth three (3) times daily. Indications: as needed     ??? azelastine (ASTEPRO) 0.15 % (205.5 mcg) 2 Sprays two (2) times a day.     ??? fluticasone propionate (FLONASE) 50 mcg/actuation nasal spray 2 Sprays by Both Nostrils route daily.     ??? butalbital-acetaminophen-caffeine (FIORICET, ESGIC) 50-325-40 mg per tablet Take 1 Tab by mouth.     ??? calcium-cholecalciferol, D3, (CALTRATE 600+D) tablet Take 1 Tab by mouth daily.     ??? buPROPion XL (WELLBUTRIN XL) 300 mg XL tablet Take 300 mg by mouth every morning.     ??? MODAFINIL (PROVIGIL PO) Take  by mouth.     ??? gabapentin (NEURONTIN) 600 mg tablet Take 300 mg by mouth two (2) times a day. Indications: 300 twice daily as needed, 800 mg at bedtime     ??? topiramate (TOPAMAX) 100 mg tablet Take 200 mg by mouth nightly.     ??? traZODone (  DESYREL) 100 mg tablet Take 100 mg by mouth nightly.     ??? dicyclomine (BENTYL) 20 mg tablet Take 20 mg by mouth nightly.     ??? albuterol (PROVENTIL HFA, VENTOLIN HFA, PROAIR HFA) 90 mcg/actuation inhaler Take 2 Puffs by inhalation every six (6) hours as needed for Wheezing.      ??? docusate sodium (COLACE) 100 mg capsule Take 1 Cap by mouth two (2) times a day for 30 days. Take twice a day while on narcotics. Hold for loose stools. 60 Cap 0   ??? ondansetron hcl (ZOFRAN) 4 mg tablet Take 1 Tab by mouth every eight (8) hours as needed for Nausea or Vomiting. 24 Tab 0   ??? tamsulosin (FLOMAX) 0.4 mg capsule Take 1 Cap by mouth daily (after dinner). 60 Cap 0   ??? potassium chloride SR (K-TAB) 20 mEq tablet Take 1 Tab by mouth daily. 5 Tab 0     No current facility-administered medications on file prior to visit.      Urinalysis / Labs:   Results for orders placed or performed in visit on 09/03/18   AMB POC URINALYSIS DIP STICK AUTO W/O MICRO   Result Value Ref Range    Color (UA POC) Yellow     Clarity (UA POC) Clear     Glucose (UA POC) Negative Negative    Bilirubin (UA POC) Negative Negative    Ketones (UA POC) Negative Negative    Specific gravity (UA POC) 1.020 1.001 - 1.035    Blood (UA POC) Negative Negative    pH (UA POC) 7.0 4.6 - 8.0    Protein (UA POC) Negative Negative    Urobilinogen (UA POC) 0.2 mg/dL 0.2 - 1    Nitrites (UA POC) Negative Negative    Leukocyte esterase (UA POC) Negative Negative     Physical Exam:  Visit Vitals  Ht 4\' 11"  (1.499 m)   Wt 109 lb (49.4 kg)   BMI 22.02 kg/m??     Constitutional: Well developed, well-nourished female in no acute distress.   CV:  RRR  Respiratory: No respiratory distress or difficulties  Abdomen:  Soft and nontender. No masses.    GU: Bladder nonpalpable.  No CVAT  Extremities: No peripheral swelling noted  MSK: FROM  Skin: No bruising or rashes.      Neuro/Psych:  Patient with appropriate affect.  Alert and oriented x 3.        Review of Systems  Constitutional: Fever: No  Skin: Rash: No  HEENT: Hearing difficulty: No  Eyes: Blurred vision: No  Cardiovascular: Chest pain: No  Respiratory: Shortness of breath: No  Gastrointestinal: Nausea/vomiting: Yes  Musculoskeletal: Back pain: Yes  Neurological: Weakness: No   Psychological: Memory loss: No  Comments/additional findings:           A copy of today's office visit with all pertinent imaging results and labs were sent to the referring physician.    CC: Marlowe Kays, MD     Mills Koller, MD

## 2018-09-17 LAB — STONE ANALYSIS, URINARY
CALCIUM OXALATE DIHYDRATE,18084: 40 %
CALCIUM OXALATE MONOHYDRATE,18087: 30 %
Calcium Oxalate Dihydrate: 40 %
Calcium Oxalate Monohydrate: 30 %
HYDROXYAPATITE: 30 %
Hydroxyapatite: 30 %
Weight: 69 mg
Weight: 69 mg

## 2018-09-17 LAB — SPECIMEN STATUS REPORT

## 2018-09-25 NOTE — Telephone Encounter (Signed)
[name] Nicks, MD  Kelli Churn       ??       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 Continue with the stone prevention measures that we discussed in past visit (especially drinking lots of water to make urine very diluted). ??Thanks.      Called patient and made her aware of the above results. Patient verbalized understanding and the call was ended.

## 2018-12-03 ENCOUNTER — Encounter: Payer: PRIVATE HEALTH INSURANCE | Attending: Urology | Primary: Geriatric Medicine

## 2018-12-03 ENCOUNTER — Encounter: Payer: PRIVATE HEALTH INSURANCE | Primary: Geriatric Medicine

## 2019-05-06 ENCOUNTER — Encounter: Payer: PRIVATE HEALTH INSURANCE | Primary: Geriatric Medicine

## 2019-05-06 ENCOUNTER — Encounter: Payer: PRIVATE HEALTH INSURANCE | Attending: Urology | Primary: Geriatric Medicine

## 2019-05-21 ENCOUNTER — Encounter: Primary: Geriatric Medicine

## 2019-05-21 ENCOUNTER — Encounter: Attending: Urology | Primary: Geriatric Medicine

## 2019-05-26 ENCOUNTER — Inpatient Hospital Stay: Admit: 2019-05-26 | Discharge: 2019-05-26 | Payer: MEDICARE | Primary: Geriatric Medicine

## 2019-05-26 ENCOUNTER — Inpatient Hospital Stay: Admit: 2019-05-26 | Payer: MEDICARE | Primary: Geriatric Medicine

## 2019-05-26 ENCOUNTER — Encounter

## 2019-05-26 DIAGNOSIS — R109 Unspecified abdominal pain: Secondary | ICD-10-CM

## 2019-05-26 LAB — URINALYSIS W/ RFLX MICROSCOPIC
Bilirubin, Urine: NEGATIVE
Bilirubin: NEGATIVE
Blood, Urine: NEGATIVE
Blood: NEGATIVE
Glucose, Ur: NEGATIVE mg/dl
Glucose: NEGATIVE mg/dl
Ketone: NEGATIVE mg/dl
Ketones, Urine: NEGATIVE mg/dl
Leukocyte Esterase, Urine: NEGATIVE
Leukocyte Esterase: NEGATIVE
Nitrite, Urine: NEGATIVE
Nitrites: NEGATIVE
Protein, UA: NEGATIVE mg/dl
Protein: NEGATIVE mg/dl
Specific Gravity, UA: 1.025 (ref 1.005–1.030)
Specific gravity: 1.025 (ref 1.005–1.030)
Urobilinogen, UA, POCT: 0.2 mg/dl (ref 0.0–1.0)
Urobilinogen: 0.2 mg/dl (ref 0.0–1.0)
pH (UA): 5.5 (ref 5.0–9.0)
pH, UA: 5.5 (ref 5.0–9.0)

## 2019-05-27 LAB — ALK PHOS
Alk. phosphatase: 89 U/L (ref 45–117)
Alkaline Phosphatase: 89 U/L (ref 45–117)

## 2019-05-27 LAB — BASIC METABOLIC PANEL
Anion Gap: 6 mmol/L (ref 5–15)
BUN: 12 mg/dl (ref 7–25)
CO2: 25 mEq/L (ref 21–32)
Calcium: 8.8 mg/dl (ref 8.5–10.1)
Chloride: 111 mEq/L — ABNORMAL HIGH (ref 98–107)
Creatinine: 1 mg/dl (ref 0.6–1.3)
EGFR IF NonAfrican American: >60
GFR African American: >60
Glucose: 96 mg/dl (ref 74–106)
Potassium: 3.1 mEq/L — ABNORMAL LOW (ref 3.5–5.1)
Sodium: 142 mEq/L (ref 136–145)

## 2019-05-27 LAB — CBC WITH AUTO DIFFERENTIAL
Basophils %: 0.6 % (ref 0–3)
Eosinophils %: 4 % (ref 0–5)
Hematocrit: 36.5 % — ABNORMAL LOW (ref 37.0–50.0)
Hemoglobin: 12.1 gm/dl — ABNORMAL LOW (ref 13.0–17.2)
Immature Granulocytes: 0.3 % (ref 0.0–3.0)
Lymphocytes %: 60.7 % — ABNORMAL HIGH (ref 28–48)
MCH: 31.4 pg (ref 25.4–34.6)
MCHC: 33.2 gm/dl (ref 30.0–36.0)
MCV: 94.8 fL (ref 80.0–98.0)
MPV: 10.6 fL — ABNORMAL HIGH (ref 6.0–10.0)
Monocytes %: 7.2 % (ref 1–13)
Neutrophils %: 27.2 % — ABNORMAL LOW (ref 34–64)
Nucleated RBCs: 0 (ref 0–0)
Platelets: 276 10*3/uL (ref 140–450)
RBC: 3.85 M/uL (ref 3.60–5.20)
RDW-SD: 43.5 (ref 36.4–46.3)
WBC: 6.7 10*3/uL (ref 4.0–11.0)

## 2019-05-27 LAB — LIPASE
Lipase: 239 U/L (ref 73–393)
Lipase: 239 U/L (ref 73–393)

## 2019-05-27 LAB — AST
AST (SGOT): 26 U/L (ref 15–37)
AST: 26 U/L (ref 15–37)

## 2019-05-27 LAB — CBC WITH AUTOMATED DIFF
BASOPHILS: 0.6 % (ref 0–3)
EOSINOPHILS: 4 % (ref 0–5)
HCT: 36.5 % — ABNORMAL LOW (ref 37.0–50.0)
HGB: 12.1 gm/dl — ABNORMAL LOW (ref 13.0–17.2)
IMMATURE GRANULOCYTES: 0.3 % (ref 0.0–3.0)
LYMPHOCYTES: 60.7 % — ABNORMAL HIGH (ref 28–48)
MCH: 31.4 pg (ref 25.4–34.6)
MCHC: 33.2 gm/dl (ref 30.0–36.0)
MCV: 94.8 fL (ref 80.0–98.0)
MONOCYTES: 7.2 % (ref 1–13)
MPV: 10.6 fL — ABNORMAL HIGH (ref 6.0–10.0)
NEUTROPHILS: 27.2 % — ABNORMAL LOW (ref 34–64)
NRBC: 0 (ref 0–0)
PLATELET: 276 10*3/uL (ref 140–450)
RBC: 3.85 M/uL (ref 3.60–5.20)
RDW-SD: 43.5 (ref 36.4–46.3)
WBC: 6.7 10*3/uL (ref 4.0–11.0)

## 2019-05-27 LAB — METABOLIC PANEL, BASIC
Anion gap: 6 mmol/L (ref 5–15)
BUN: 12 mg/dl (ref 7–25)
CO2: 25 mEq/L (ref 21–32)
Calcium: 8.8 mg/dl (ref 8.5–10.1)
Chloride: 111 mEq/L — ABNORMAL HIGH (ref 98–107)
Creatinine: 1 mg/dl (ref 0.6–1.3)
GFR est AA: >60
GFR est non-AA: >60
Glucose: 96 mg/dl (ref 74–106)
Potassium: 3.1 mEq/L — ABNORMAL LOW (ref 3.5–5.1)
Sodium: 142 mEq/L (ref 136–145)

## 2019-05-28 LAB — CULTURE, URINE
CULTURE RESULT: NO GROWTH
Culture Result: NO GROWTH

## 2019-06-23 ENCOUNTER — Encounter: Primary: Geriatric Medicine

## 2019-06-23 ENCOUNTER — Encounter: Attending: Urology | Primary: Geriatric Medicine

## 2019-08-17 ENCOUNTER — Encounter: Primary: Geriatric Medicine

## 2019-08-17 ENCOUNTER — Ambulatory Visit: Admit: 2019-08-17 | Discharge: 2019-08-17 | Attending: Urology | Primary: Geriatric Medicine

## 2019-08-17 ENCOUNTER — Ambulatory Visit: Attending: Urology | Primary: Geriatric Medicine

## 2019-08-17 DIAGNOSIS — N200 Calculus of kidney: Secondary | ICD-10-CM

## 2019-08-17 LAB — AMB POC URINALYSIS DIP STICK AUTO W/O MICRO
Bilirubin (UA POC): NEGATIVE
Bilirubin, Urine, POC: NEGATIVE
Blood (UA POC): NEGATIVE
Blood (UA POC): NEGATIVE
Glucose (UA POC): NEGATIVE
Glucose, Urine, POC: NEGATIVE
Ketones (UA POC): NEGATIVE
Ketones, Urine, POC: NEGATIVE
Leukocyte Esterase, Urine, POC: NEGATIVE
Leukocyte esterase (UA POC): NEGATIVE
Nitrite, Urine, POC: NEGATIVE
Nitrites (UA POC): NEGATIVE
Protein (UA POC): NEGATIVE
Protein, Urine, POC: NEGATIVE
Specific Gravity, Urine, POC: 1.02 NA (ref 1.001–1.035)
Specific gravity (UA POC): 1.02 (ref 1.001–1.035)
Urobilinogen (UA POC): 0.2 (ref 0.2–1)
Urobilinogen, POC: 0.2 (ref 0.2–1)
pH (UA POC): 7 (ref 4.6–8.0)
pH, Urine, POC: 7 NA (ref 4.6–8.0)

## 2019-08-17 NOTE — Progress Notes (Signed)
Progress  Notes by [name], MD at 08/17/19 1415                Author: [name], MD  Service: --  Author Type: Physician       Filed: 08/17/19 1539  Encounter Date: 08/17/2019  Status: Signed          Editor: [name], MD (Physician)                              08/17/2019       ASSESSMENT:    Left ureteral stone 71mm -- passed 08/20/2018      Right distal ureteral stone 46mm causing refractory renal colic and unable to pass the stone.    -- 04/23/2018 s/p Cysto, Right URS, stone extraction and ureteral stent placement at [facility]      h/o Fibromyalgia since age 1 yo.       PLAN:     Continue Stone prevention:   A) Increase fluid consumption to make at least 2-2.5 litre of urine per day or consume at least 3 liters of fluid daily.   B) To decrease Calcium in urine, Decrease dietary Salt (or Sodium) intake to <3200 mg/day.   C) To decrease Oxalate in urine, Avoid dark drinks (ie, Coffee, Tea, Colas, etc), nuts, chocolate   D) To increase Citrate in urine, drink lemonade or and lemon juice to your water      F/U PRN         CC: Kidney stone      HISTORY OF PRESENT ILLNESS:    [name] [name] is a 49 y.o. white  female who presents in follow up for a left ureteral stone.   08/05/2018 pt seen by Shon Baton, NP for new left flank pain   08/05/2018 KUB showed a possible 4.40mm calcification overlying the left distal ureter      >> Doing well, Reports no new voiding c/o or difficulty.  No new urologic issue.  No UTIs.    Denies any abd/flank pain -- has h/o Fibromyalgia, intermittent right back pain.   No f/c/n/v.       Today's 08/17/2019 KUB: No obvious kidney or ureteral stones.  Bilateral pelvic phleboliths.  IUD present.      From 09/03/2018 visit:   >>Doing well, passed stone on Thursday 08/20/2018 -- brought in stone today   Currently still having some mild RUQ abd pain, related to eating   No f/c/n/v.  No voiding difficulty, dysuria, hematuria         March 2020 pt had a Right distal ureteral stone 5mm  causing refractory renal colic and  unable to pass the stone.    04/23/2018 s/p Cysto, Right URS, stone extraction and ureteral stent placement at [facility]   FINDINGS: Right distal ureteral stone 34mm, very soft --  crumbled during extraction.      Has h/o fibromyalgia since age 44 yo.    No voiding difficulty.  No hematuria, dysuria.       08/24/2018 Renal US:   Mild bilateral hydronephrosis. ??No visualized nephrolithiasis on this modality. Please see report for additional findings and details.    Bilateral ureteral jets were observed.      04/21/2018 CT scan:   1. Obstructing stone right UVJ stone 73mm, moderate hydronephrosis.   2. Nonobstructing left nephrolithiasis, largest stone measures 5 mm.   3. Appendectomy and bilateral tubal ligation; IUD  in satisfactory position.            PMH/PSH/SocHx/FamHx/ Meds & allergies reviewed.      Past Medical History:        Diagnosis  Date         ?  Chronic kidney disease            stage 3         ?  COVID-19 vaccine series completed  05/19/2019          [employer]         ?  COVID-19 virus antibody detected  01/2019     ?  Fibromyalgia           ?  Palate mass            Past Surgical History:         Procedure  Laterality  Date          ?  HX APPENDECTOMY         ?  HX CESAREAN SECTION         ?  HX HERNIA REPAIR              left          ?  HX OTHER SURGICAL              jaw surgery          ?  HX OVARIAN CYST REMOVAL         ?  HX SEPTOPLASTY              ?  HX TUBAL LIGATION              Social History          Socioeconomic History         ?  Marital status:  MARRIED              Spouse name:  Not on file         ?  Number of children:  Not on file     ?  Years of education:  Not on file     ?  Highest education level:  Not on file       Occupational History        ?  Not on file       Tobacco Use         ?  Smoking status:  Never Smoker     ?  Smokeless tobacco:  Never Used       Vaping Use         ?  Vaping Use:  Never used       Substance and Sexual Activity         ?   Alcohol use:  No     ?  Drug use:  No     ?  Sexual activity:  Not on file        Other Topics  Concern        ?  Not on file       Social History Narrative        ?  Not on file          Social Determinants of Health          Financial Resource Strain:         ?  Difficulty of Paying Living Expenses:        Food Insecurity:         ?  Worried About Programme researcher, broadcasting/film/video in the Last Year:      ?  Barista in the Last Year:        Transportation Needs:         ?  Freight forwarder (Medical):      ?  Lack of Transportation (Non-Medical):        Physical Activity:         ?  Days of Exercise per Week:      ?  Minutes of Exercise per Session:        Stress:         ?  Feeling of Stress :        Social Connections:         ?  Frequency of Communication with Friends and Family:      ?  Frequency of Social Gatherings with Friends and Family:      ?  Attends Religious Services:      ?  Active Member of Clubs or Organizations:      ?  Attends Banker Meetings:      ?  Marital Status:        Intimate Partner Violence:         ?  Fear of Current or Ex-Partner:      ?  Emotionally Abused:      ?  Physically Abused:         ?  Sexually Abused:           Family History         Problem  Relation  Age of Onset          ?  Kidney Disease  Mother       ?  Depression  Mother       ?  Breast Cancer  Mother            ?  Heart Disease  Father            Allergies        Allergen  Reactions         ?  Augmentin [Amoxicillin-Pot Clavulanate]  Diarrhea         ?  Demerol [Meperidine]  Hives and Nausea and Vomiting          Current Outpatient Medications on File Prior to Visit          Medication  Sig  Dispense  Refill           ?  gabapentin (NEURONTIN) 800 mg tablet  nightly.         ?  gabapentin (NEURONTIN) 300 mg capsule  TAKE 1 CAPSULE BY MOUTH TWICE DAILY         ?  modafiniL (PROVIGIL) 200 mg tablet  TAKE 1 TO 2 TABLETS BY MOUTH EVERY DAY         ?  fexofenadine HCl (ALLEGRA PO)  Take  by mouth.         ?   baclofen (LIORESAL) 10 mg tablet  Take 10 mg by mouth three (3) times daily. Indications: as needed               ?  azelastine (ASTEPRO) 0.15 % (205.5 mcg)  2 Sprays two (2) times a day.               ?  fluticasone propionate (FLONASE) 50 mcg/actuation nasal spray  2 Sprays by Both  Nostrils route daily.         ?  butalbital-acetaminophen-caffeine (FIORICET, ESGIC) 50-325-40 mg per tablet  Take 1 Tab by mouth.         ?  calcium-cholecalciferol, D3, (CALTRATE 600+D) tablet  Take 1 Tab by mouth daily.         ?  buPROPion XL (WELLBUTRIN XL) 300 mg XL tablet  Take 300 mg by mouth every morning.         ?  topiramate (TOPAMAX) 100 mg tablet  Take 300 mg by mouth nightly.         ?  traZODone (DESYREL) 100 mg tablet  Take 100 mg by mouth nightly.         ?  dicyclomine (BENTYL) 20 mg tablet  Take 20 mg by mouth three (3) times daily.               ?  albuterol (PROVENTIL HFA, VENTOLIN HFA, PROAIR HFA) 90 mcg/actuation inhaler  Take 2 Puffs by inhalation every six (6) hours as needed for Wheezing.              No current facility-administered medications on file prior to visit.        Urinalysis / Labs:      Results for orders placed or performed in visit on 08/17/19     AMB POC URINALYSIS DIP STICK AUTO W/O MICRO         Result  Value  Ref Range            Color (UA POC)  Yellow         Clarity (UA POC)  Clear         Glucose (UA POC)  Negative  Negative       Bilirubin (UA POC)  Negative  Negative       Ketones (UA POC)  Negative  Negative       Specific gravity (UA POC)  1.020  1.001 - 1.035       Blood (UA POC)  Negative  Negative       pH (UA POC)  7.0  4.6 - 8.0       Protein (UA POC)  Negative  Negative       Urobilinogen (UA POC)  0.2 mg/dL  0.2 - 1       Nitrites (UA POC)  Negative  Negative            Leukocyte esterase (UA POC)  Negative  Negative        Physical Exam:   Visit Vitals      Resp  20     Ht  4\' 11"  (1.499 m)     Wt  106 lb 6 oz (48.3 kg)        BMI  21.49 kg/m??        Constitutional: Well  developed, well-nourished female in no acute distress.    Respiratory: No respiratory distress or difficulties   Extremities: No peripheral swelling noted   MSK: FROM   Skin: No jaundice       Neuro/Psych:  Patient with appropriate affect.  Alert and oriented x 3.           Review of Systems   Constitutional: Fever: No   Skin: Rash: No   HEENT: Hearing difficulty: No   Eyes: Blurred vision: No   Cardiovascular: Chest pain: No   Respiratory: Shortness of breath: No   Gastrointestinal: Nausea/vomiting: No  Musculoskeletal: Back pain: No   Neurological: Weakness: No   Psychological: Memory loss: No   Comments/additional findings:                A copy of today's office visit with all pertinent imaging results and labs were sent to the referring physician.     CC: Vivianne Master, MD       [name], MD

## 2019-08-17 NOTE — Progress Notes (Signed)
KUB performed today per Dr. Kwong.
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
## 2020-03-22 ENCOUNTER — Inpatient Hospital Stay
Admit: 2020-03-22 | Discharge: 2020-03-22 | Disposition: A | Discharge status: Home / Self Care | Payer: MEDICARE | Attending: Emergency Medicine

## 2020-03-22 DIAGNOSIS — R519 Headache, unspecified: Secondary | ICD-10-CM

## 2020-03-22 MED ORDER — KETOROLAC TROMETHAMINE 30 MG/ML INJECTION
30 mg/mL (1 mL) | INTRAMUSCULAR | Status: AC
Start: 2020-03-22 — End: 2020-03-22
  Administered 2020-03-22: 17:00:00 via INTRAVENOUS

## 2020-03-22 MED ORDER — ONDANSETRON 4 MG TAB, RAPID DISSOLVE
4 mg | ORAL | Status: AC
Start: 2020-03-22 — End: 2020-03-22
  Administered 2020-03-22: 17:00:00 via SUBLINGUAL

## 2020-03-22 MED ORDER — DIPHENHYDRAMINE 25 MG CAP
25 mg | ORAL | Status: AC
Start: 2020-03-22 — End: 2020-03-22
  Administered 2020-03-22: 17:00:00 via ORAL

## 2020-03-22 MED FILL — DIPHENHYDRAMINE 25 MG CAP: 25 mg | ORAL | Qty: 1

## 2020-03-22 MED FILL — ONDANSETRON 4 MG TAB, RAPID DISSOLVE: 4 mg | ORAL | Qty: 1

## 2020-03-22 MED FILL — KETOROLAC TROMETHAMINE 30 MG/ML INJECTION: 30 mg/mL (1 mL) | INTRAMUSCULAR | Qty: 1

## 2020-03-22 NOTE — ED Provider Notes (Signed)
ED Provider Notes by Apolonio Schneiders, NP at 03/22/20 1122                Author: Apolonio Schneiders, NP  Service: EMERGENCY  Author Type: Nurse Practitioner       Filed: 03/23/20 2006  Date of Service: 03/22/20 1122  Status: Attested           Editor: Apolonio Schneiders, NP (Nurse Practitioner)  Cosigner: Posey Pronto, MD at 03/28/20 1433          Attestation signed by Posey Pronto, MD at 03/28/20 1433          I have discussed the patient with the mid-level provider. I agree with their evaluation and treatment plan as documented here. I did not see this patient personally,  but I was available to see the patient at their request.                                  [facility]   Emergency Department Treatment Report                Patient: [name] [name]  Age: 50 y.o.  Sex: female          Date of Birth: 1970/06/29  Admit Date: (Not on file)  PCP: Vivianne Master, MD     MRN: [id number]   CSN: [id number]   Attending: Posey Pronto, MD         Room: Room/bed info not found  Time Dictated: 11:22 AM  APP: Willette Brace              Chief Complaint      Chief Complaint       Patient presents with        ?  Fall        ?  Headache             History of Present Illness     This is a 50 y.o.  female with a history of asthma, and fibromyalgia who presents to the ER with complaints of headache.  Patient reports falling 1 month ago; she was about to take the dogs out, recalls having her hand  on the doorknob and the next thing she recalls is hitting her head on the door and then on her tile floor.  She states that has spasms in different places d/t her fibromyalgia. Patient complains of a constant sharp right-sided headache that radiates to  her forehead since this fall.  Patient reports associated tinnitus in her right ear, intermittent blurry vision in her right eye, and right-sided neck pain.   Patient describes the blurry vision in her right eye like a haziness in her peripheral  vision.    Patient states her headache is worse with activity, particularly worse when she is bending over, and improves with rest.  She has a history of migraines, however reports this is different.   Patient reports no relief with Fioricet, Tylenol, or changing her pillow.    She does not endorse that this is the worst headache of her life, no dizziness, chest pain or shortness of breath.    Patient denies any fevers, numbness, tingling, weakness, chest pain, shortness of breath, abdominal pain, vomiting or diarrhea.    PMHPSH includes asthma, fibromyalgia         Review of Systems  Review of Systems    Constitutional: Negative for fever.    HENT: Positive for tinnitus.     Eyes: Positive for blurred vision.    Respiratory: Negative for shortness of breath.     Cardiovascular: Negative for chest pain and leg swelling.    Gastrointestinal: Negative for abdominal pain, nausea and vomiting.    Musculoskeletal: Positive for falls and neck pain . Negative for back pain and joint pain.    Skin: Negative for rash.    Neurological: Positive for headaches. Negative for sensory change and weakness.    All other systems reviewed and are negative.           Past Medical/Surgical History          Past Medical History:        Diagnosis  Date         ?  Chronic kidney disease            stage 3         ?  COVID-19 vaccine series completed  05/19/2019          Pfeizer         ?  COVID-19 virus antibody detected  01/2019     ?  Fibromyalgia           ?  Palate mass            Past Surgical History:         Procedure  Laterality  Date          ?  HX APPENDECTOMY         ?  HX CESAREAN SECTION         ?  HX HERNIA REPAIR              left          ?  HX OTHER SURGICAL              jaw surgery          ?  HX OVARIAN CYST REMOVAL         ?  HX SEPTOPLASTY              ?  HX TUBAL LIGATION                 Social History          Social History          Socioeconomic History         ?  Marital status:  MARRIED              Spouse  name:  Not on file         ?  Number of children:  Not on file     ?  Years of education:  Not on file     ?  Highest education level:  Not on file       Occupational History        ?  Not on file       Tobacco Use         ?  Smoking status:  Never Smoker     ?  Smokeless tobacco:  Never Used       Vaping Use         ?  Vaping Use:  Never used       Substance and Sexual Activity         ?  Alcohol use:  No     ?  Drug use:  No     ?  Sexual activity:  Not on file        Other Topics  Concern        ?  Not on file       Social History Narrative        ?  Not on file          Social Determinants of Health          Financial Resource Strain:         ?  Difficulty of Paying Living Expenses: Not on file       Food Insecurity:         ?  Worried About Running Out of Food in the Last Year: Not on file     ?  Ran Out of Food in the Last Year: Not on file       Transportation Needs:         ?  Lack of Transportation (Medical): Not on file     ?  Lack of Transportation (Non-Medical): Not on file       Physical Activity:         ?  Days of Exercise per Week: Not on file     ?  Minutes of Exercise per Session: Not on file       Stress:         ?  Feeling of Stress : Not on file       Social Connections:         ?  Frequency of Communication with Friends and Family: Not on file     ?  Frequency of Social Gatherings with Friends and Family: Not on file     ?  Attends Religious Services: Not on file     ?  Active Member of Clubs or Organizations: Not on file     ?  Attends Banker Meetings: Not on file     ?  Marital Status: Not on file       Intimate Partner Violence:         ?  Fear of Current or Ex-Partner: Not on file     ?  Emotionally Abused: Not on file     ?  Physically Abused: Not on file     ?  Sexually Abused: Not on file       Housing Stability:         ?  Unable to Pay for Housing in the Last Year: Not on file     ?  Number of Places Lived in the Last Year: Not on file        ?  Unstable Housing in the  Last Year: Not on file             Family History          Family History         Problem  Relation  Age of Onset          ?  Kidney Disease  Mother       ?  Depression  Mother       ?  Breast Cancer  Mother            ?  Heart Disease  Father               Current Medications  Cannot display prior to admission medications because the patient has not been admitted in this contact.             Allergies          Allergies        Allergen  Reactions         ?  Augmentin [Amoxicillin-Pot Clavulanate]  Diarrhea         ?  Demerol [Meperidine]  Hives and Nausea and Vomiting             Physical Exam          ED Triage Vitals [03/22/20 1034]     Enc Vitals Group           BP  111/63        Pulse (Heart Rate)  80        Resp Rate  18        Temp  98.1 ??F (36.7 ??C)        Temp src          O2 Sat (%)  100 %        Weight  110 lb 0.2 oz        Height  4\' 11"         Head Circumference          Peak Flow          Pain Score          Pain Loc          Pain Edu?             Excl. in GC?          Physical Exam   Constitutional :        Appearance: Normal appearance.   HENT :       Head: Normocephalic and atraumatic.      Right Ear: Ear canal normal. There is no impacted cerumen (cermun blocking view of TM ) .      Left Ear: Tympanic membrane and ear canal normal.      Nose: Nose normal.    Eyes:       Extraocular Movements: Extraocular movements intact.      Conjunctiva/sclera: Conjunctivae normal.      Pupils: Pupils are equal, round, and reactive to light.   Cardiovascular :       Rate and Rhythm: Normal rate and regular rhythm.      Heart sounds: Normal heart sounds.    Pulmonary:       Effort: Pulmonary effort is normal.      Breath sounds: Normal breath sounds.   Abdominal :      Palpations: Abdomen is soft.      Tenderness: There is no abdominal tenderness.     Musculoskeletal:          General: Normal range of motion.      Cervical back: Normal range of motion.    Skin:      General: Skin is warm and dry.       Capillary Refill: Capillary refill takes less than 2 seconds.    Neurological:       General: No focal deficit present.      Mental Status: She is alert and oriented to person, place, and time.      GCS: GCS eye subscore is 4 . GCS verbal subscore is 5. GCS motor subscore is 6 .      Sensory: Sensation  is intact.      Motor: Motor function is intact.      Coordination: Coordination is intact.    Psychiatric:         Mood and Affect: Mood normal.         Behavior: Behavior normal.                Impression and Management Plan     Patient is a 50 year old female with complaints of headache x 1 month      Ddx include fever, viral illness, tension headache, migraine, skull fracture, meningitis,  URI, trigeminal neuralgia, and zoster            This patient was evaluated in the emergency department for symptoms described in the history of present illness. They were evaluated in the context of global COVID-19 pandemic, which necessitated consideration of the patient may be at  risk for infection  with the SARS-COV-2 virus that causes COVID-19. Institutional protocols and algorithms that pertain to the evaluation of patients at risk for COVID-19 are in a state of rapid change based on the information released by regulatory bodies including the  CDC and federal state organizations.  These policies and algorithms were followed during the patient's care in the emergency department.        Procedures           Diagnostic Studies     Lab:    No results found for this or any previous visit (from the past 12 hour(s)).   Labs Reviewed - No data to display      Imaging:     No results found.         EKG        Medical Decision Making/ ED Course     Patient presents with constant  Headache s/p fall 1 month ago.    Given pt's fall occurred 1 month ago, she remains neurovascularly intact, without any focal neurological findings    Patient has no midline cervical, thoracic or lumbar spine tenderness, tenderness is greater on her right  paraspinous/trapezius area and right lumbar / gluteal area.    There are no red flags symptoms. No fevers, no neck stiffness, no neurologic complaints or focal neurologic findings, no loss of vision.    The headache is no maximal, does not have persistent vision change and reports headache eases with rest    Patient received Toradol, Benadryl, and Zofran.     Patient remained clinically stable throughout the emergency room visit.  Vitals were unremarkable and the patient's condition required no further Emergency Department intervention.    The patient is stable for outpatient management with appropriate  symptomatic treatment, return precautions, and was advised of the importance of outpatient follow up for ongoing care.                   Medications       ketorolac (TORADOL) injection 15 mg (has no administration in time range)     diphenhydrAMINE (BENADRYL) capsule 25 mg (has no administration in time range)       ondansetron (ZOFRAN ODT) tablet 4 mg (has no administration in time range)              During the patient's stay in the ER the patient did not develop any new or worsening symptoms and remained stable.         Final Diagnosis  ICD-10-CM  ICD-9-CM          1.  Nonintractable headache, unspecified chronicity pattern, unspecified headache type   R51.9  784.0             Disposition        Disposition and plan   Patient was discharged home in stable condition with discharge instructions on the same.    Return to the ER if condition worsens or new symptoms develop.    Follow up with primary care as discussed.         Current Discharge Medication List                     The patient was personally evaluated by myself and discussed with Dr. Truddie Crumble, Jonelle Sports, MD who agrees with the above assessment and plan.      Dragon medical dictation software was used for portions of this report. Unintended errors may occur.       Apolonio Schneiders, NP   March 22, 2020      My signature above  authenticates this document and my orders, the final     diagnosis (es), discharge prescription (s), and instructions in the Epic     record.   If you have any questions please contact [phone].       Nursing notes have been reviewed by the physician/ advanced practice     Clinician.

## 2020-03-22 NOTE — ED Notes (Signed)
Pt seen and evaluated by PA

## 2020-03-22 NOTE — ED Notes (Signed)
Patient here for a headache after a fall 1 month. PCP told her to come to the ER.

## 2020-04-06 ENCOUNTER — Encounter

## 2020-04-10 ENCOUNTER — Encounter

## 2020-04-26 ENCOUNTER — Inpatient Hospital Stay: Admit: 2020-04-26 | Payer: MEDICARE | Primary: Geriatric Medicine

## 2020-04-26 DIAGNOSIS — K219 Gastro-esophageal reflux disease without esophagitis: Secondary | ICD-10-CM

## 2020-04-26 MED ORDER — BARIUM SULFATE 40 % (W/V) ORAL POWDER
81 % (w/w) | Freq: Once | ORAL | Status: AC
Start: 2020-04-26 — End: 2020-04-26
  Administered 2020-04-26: 16:00:00 via ORAL

## 2020-04-26 MED ORDER — BARIUM SULFATE 40 % (W/V), 30% (W/W) ORAL PASTE
40 % (w/v), 30% (w/w) | Freq: Once | ORAL | Status: AC
Start: 2020-04-26 — End: 2020-04-26
  Administered 2020-04-26: 16:00:00 via ORAL

## 2020-04-26 MED FILL — VARIBAR THIN LIQUID 81 % (W/W) ORAL POWDER: 81 % (w/w) | ORAL | Qty: 30

## 2020-04-26 MED FILL — VARIBAR PUDDING 40 % (W/V), 30% (W/W) ORAL PASTE: 40 % (w/v), 30% (w/w) | ORAL | Qty: 15

## 2020-04-26 NOTE — Progress Notes (Signed)
Progress Notes by Johny Blamer, SLP at 04/26/20 1100                Author: Johny Blamer, SLP  Service: Speech Language Pathologist  Author Type: Speech Language Pathologist       Filed: 04/26/20 1351  Date of Service: 04/26/20 1100  Status: Signed          Editor: Johny Blamer, SLP (Speech Language Pathologist)                    [facility]   [address] [place], [place] [address]   Phone [phone] Fax [phone]                                                                          SPEECH LANGUAGE PATHOLOGY   MODIFIED BARIUM SWALLOW STUDY         Patient Name: [name] [name]   MR#:  [id number]   DOB: 14-Oct-1970   Insurance: Payor: [employer] / Plan: [facility] [employer] MEDICARE / Product Type: Managed  Care Medicare /     Date of Exam: 04/26/20   Time of Exam: 11:35-11:50 am      Referring Physician: Ernst Spell, MD   Primary Care Physician: Vivianne Master, MD         History of Present Illness: This 50 y.o. year old  female was referred for MBS to assess oral pharyngeal swallowing skills, given complaints of globus feeling in her throat.      Past Medical History:      Past Medical History:        Diagnosis  Date         ?  Chronic kidney disease            stage 3         ?  COVID-19 vaccine series completed  05/19/2019          Pfeizer         ?  COVID-19 virus antibody detected  01/2019     ?  Fibromyalgia           ?  Palate mass             Allergies:     Allergies        Allergen  Reactions         ?  Augmentin [Amoxicillin-Pot Clavulanate]  Diarrhea         ?  Demerol [Meperidine]  Hives and Nausea and Vomiting        Pain: No pain reported   General Observations:  Alert & cooperative t/o procedure.        Oral Motor Assessment: No facial asymmetry at rest.  Natural dentition in good condition.  Lingual ROM/strength WFL on the left/right.  Labial  ROM WFL on left/right for protrusion and retraction.       Motor Speech/Voice Production:  Connected speech was adequate for rate, rhythm, and precision.  Voice quality was clear and audible.  Comprehensibility  was judged to be 100% by unfamiliar listener.      Procedure: The study was completed with the patient in the lateral plane. The  following consistencies were assessed: thin liquid via spoon, cup, and straw; puree; and solid.       Oral Phase:   Bolus acceptance and retrieval were satisfactory.  No consistent anterior loss occurred.  Bolus cohesion was intact.  Mastication was timely. Bolus preparation and transport were effective  in clearing oral cavity completely with soft solid and solid consistencies.  No stasis accumulated along the lingual dorsum.      Pharyngeal Phase:  Swallow onset was timely with adequate tongue base retraction, complete epiglottic inversion, and sufficient hyolaryngeal elevation. Consequently, no retention accumulated within  the pharyngeal recesses. No aspiration or penetration was observed with trials of thin liquid, puree, soft solid and solid consistencies during this MBSS.       Esophageal Phase:  In lateral plane for non-diagnostic screen of esophagus, no deficits were apparent. Please see radiologists report for further details.       Impressions: Patient's oral-pharyngeal swallowing skills appeared to be [facility]. No observable penetration or aspiration with any of the tested consistencies during today's assessment. Based upon this MBSS,  recommend Regular Diet with Thin Liquid as tolerated by patient.        RECOMMENDATIONS:      1.  Diet: Regular and Thin  as tolerated by patient      2.  Aspiration Precautions: Awake and alert, May feed self independently, Seated  upright, Slow rate, Small bites/sips, Remain upright at least 30 minutes after meals and Meticulous oral care      3.  Monitor closely for s.s of aspiration or worsening dysphagia.  Immediatly alert physician if patient exhibits changes in swallowing.          Patient/Family Education: Following the  MBSS, SLP provided education to the patient regarding the following topics: basic  oral and pharyngeal anatomy & physiology; aspiration risks and consequences; results of MBSS; SLP recommendations.  Patient did state comprehension.          Thank you for allowing me to be involved in the care of this patient. If you have any questions or require further information; please feel free to contact me at [phone]         Ileene Musa MS, CCC-SLP CBIS   Speech Language Pathologist   Clinical Brain Injury Specialist

## 2021-04-16 ENCOUNTER — Encounter

## 2021-04-18 ENCOUNTER — Inpatient Hospital Stay: Admit: 2021-04-18 | Primary: Geriatric Medicine

## 2021-06-10 ENCOUNTER — Inpatient Hospital Stay
Admit: 2021-06-10 | Discharge: 2021-06-14 | Disposition: A | Discharge status: Home / Self Care | Payer: MEDICARE | Admitting: Internal Medicine

## 2021-06-10 ENCOUNTER — Emergency Department: Admit: 2021-06-11 | Payer: MEDICARE | Primary: Family Medicine

## 2021-06-10 DIAGNOSIS — K529 Noninfective gastroenteritis and colitis, unspecified: Secondary | ICD-10-CM

## 2021-06-10 DIAGNOSIS — K559 Vascular disorder of intestine, unspecified: Secondary | ICD-10-CM

## 2021-06-10 NOTE — ED Notes (Signed)
Report called to RN Abigail Butts  Questions and concerns addressed     Blenda Bridegroom, RN  06/10/21 2336

## 2021-06-10 NOTE — ED Notes (Signed)
Patient with the complaints of blood in stool  Patient admits to having nausea but denies vomiting  Patient denies dizziness but states that she feels  weak       Blenda Bridegroom, RN  06/10/21 2052

## 2021-06-10 NOTE — ED Notes (Signed)
Patient in bed,   Respiration even and unlanoured  Husband at bedside       [name] [name], [place]  06/10/21 2231

## 2021-06-10 NOTE — ED Provider Notes (Cosign Needed)
[facility]  Emergency Department Treatment Report    Patient: RAFEEF [name] Age: 51 y.o. Sex: female    Date of Birth: Oct 31, 1970 Admit Date: 06/10/2021 PCP: Vivianne Master, MD   MRN: [id number]  CSN: [id number]  [place]   Room: ER12/ER12 Time Dictated: 11:20 PM [name] [name]       Chief Complaint     Chief Complaint   Patient presents with    Rectal Bleeding       History of Present Illness       This is a 51 y.o. female presenting for further evaluation after diarrheal bowel movements today, 6 times, with progressively worsening bright red blood.  Patient is followed by Dr. Alycia Patten, history of celiac disease, she had called him and spoke with him personally and he advised that she worsens come to the emergency department for further evaluation.  Patient states abdominal pain is lower abdomen, cramping nonradiating no exacerbating or relieving factors.  Denies any  lightheadedness.    Patient is not on any anticoagulation.  She thinks she may have had a similar episode in the remote past.    Review of Systems       Constitutional:  No chills.  Eyes: No visual symptoms.  ENT: No sore throat, runny nose or ear pain.  Respiratory: No cough, dyspnea or wheezing.  Cardoivascular: No chest pain, pressure, palpitations, tightness or heaviness.  Gastrointestinal: As above.  Nausea no vomiting.  No hematemesis, bright red blood per rectum.  Genitourinary: No dysuria, frequency, or urgency.  Musculoskeletal: No joint pain or swelling.  Integumentary: No rashes.  Neurological: Mild headache, no severe or atypical headaches, sensory or motor symptoms.      Past Medical/Surgical History     Past Medical History:   Diagnosis Date    Chronic kidney disease     stage 3    COVID-19 vaccine series completed 05/19/2019    Pfeizer    COVID-19 virus antibody detected 01/2019    Fibromyalgia     Palate mass      Past Surgical History:   Procedure Laterality Date    APPENDECTOMY      CESAREAN SECTION      HERNIA REPAIR      left     OTHER SURGICAL HISTORY      jaw surgery    OVARIAN CYST REMOVAL      SEPTOPLASTY      TUBAL LIGATION         Social History     Social History     Socioeconomic History    Marital status: Married   Tobacco Use    Smoking status: Never    Smokeless tobacco: Never   Substance and Sexual Activity    Alcohol use: No    Drug use: No       Family History     Family History   Problem Relation Age of Onset    Heart Disease Father     Breast Cancer Mother     Depression Mother     Kidney Disease Mother        Current Medications     Current Facility-Administered Medications   Medication Dose Route Frequency Provider Last Rate Last Admin    0.9 % sodium chloride bolus  1,000 mL IntraVENous Once Renella Cunas, PA-C 495.9 mL/hr at 06/10/21 2216 1,000 mL at 06/10/21 2216     Current Outpatient Medications   Medication Sig Dispense Refill    albuterol  sulfate HFA (PROVENTIL;VENTOLIN;PROAIR) 108 (90 Base) MCG/ACT inhaler Inhale 2 puffs into the lungs every 6 hours as needed      azelastine HCl 0.15 % SOLN 2 sprays 2 times daily      baclofen (LIORESAL) 10 MG tablet Take 10 mg by mouth 3 times daily      buPROPion (WELLBUTRIN XL) 300 MG extended release tablet Take 300 mg by mouth      butalbital-acetaminophen-caffeine (FIORICET, ESGIC) 50-325-40 MG per tablet Take 1 tablet by mouth      calcium carb-cholecalciferol 600-10 MG-MCG TABS per tab Take 1 tablet by mouth daily      dicyclomine (BENTYL) 20 MG tablet Take 20 mg by mouth 3 times daily      fluticasone (FLONASE) 50 MCG/ACT nasal spray 2 sprays by Nasal route daily      gabapentin (NEURONTIN) 300 MG capsule TAKE 1 CAPSULE BY MOUTH TWICE DAILY      gabapentin (NEURONTIN) 800 MG tablet nightly.      modafinil (PROVIGIL) 200 MG tablet TAKE 1 TO 2 TABLETS BY MOUTH EVERY DAY      topiramate (TOPAMAX) 100 MG tablet Take 300 mg by mouth      traZODone (DESYREL) 100 MG tablet Take 100 mg by mouth         Allergies     Allergies   Allergen Reactions    Amoxicillin-Pot  Clavulanate Diarrhea    Meperidine Hives and Nausea And Vomiting       Physical Exam     BP 103/60   Pulse 95   Temp 98.7 F (37.1 C) (Oral)   Resp 18   Ht 1.499 m   Wt 54.9 kg   SpO2 95%   BMI 24.45 kg/m       Constitutional: Patient appears well developed and well nourished. Marland Kitchen Appearance and behavior are age and situation appropriate.  Eyes: Conjutivae clear, lids normal. Pupils equal, symmetrical, and normally reactive.  HEENT: Mucous membranes moist, non-erythematous. Surface of the pharynx, palate, and tongue are pink, moist and without lesions.  Neck: supple, non tender, symmetrical, no masses or meningismus.   Respiratory: lungs clear to auscultation, nonlabored respirations. No tachypnea or accessory muscle use.  Cardiovascular: heart regular rate and rhythm without murmur rubs or gallops.   Calves soft and non-tender. Distal pulses 2+ and equal bilaterally.  No peripheral edema or significant variscosities.    Gastrointestinal: Some right upper and left lower quadrant abdominal tenderness no rebound no guarding no distention.  Abdominal pain is distractible.  Rectal examination shows light brown Hemoccult positive stool, trace.  Exam performed with ACT Manuella at bedside.  Musculoskeletal: Nail beds pink with prompt capillary refill  Integumentary: warm and dry without rashes or lesions  Neurologic: alert and oriented, Sensation intact, motor strength equal and symmetric.  No facial asymmetry or dysarthria.        Impression and Management Plan     Obtain screening studies for infectious metabolic abnormalities coagulopathy acute blood loss evaluate with CT for obstructive or inflammatory abnormalities.  No recent antibiotics by history to suggest C. difficile.    Differential Diagnoses: Colitis bowel obstruction hepatitis pancreatitis acute blood loss coagulopathy proctitis hemorrhoidal bleeding upper GI bleed    Procedures     This patient was evaluated in the emergency department for symptoms  described in the history of present illness. They were evaluated in the context of global COVID-19 pandemic, which necessitated consideration of the patient may be at  risk for  infection with the SARS-COV-2 virus that causes COVID-19.  Institutional protocols and algorithms that pertain to the evaluation of patients at risk for COVID-19 are in a state of rapid change based on the information released by regulatory bodies including the CDC and federal state organizations.  These policies and algorithms were followed during the patient's care in the emergency department.      Diagnostic Studies       Lab:     Results for orders placed or performed during the hospital encounter of 06/10/21   CT ABDOMEN PELVIS W IV CONTRAST Additional Contrast? None    Narrative    EXAMINATION: CT ABDOMEN PELVIS W IV CONTRAST   INDICATION:  evaluate colitis - diarrhea, rectal bleed, abdominal pain, fever;  hx appendectomy  COMPARISON: 05/26/2019  WORKSTATION ID: [id number]  TECHNIQUE: Multiplanar CT of the abdomen and pelvis was performed with IV  contrast. All CT exams at this facility use one or more dose reduction  techniques including automatic exposure control, mA/kV adjustment per patient's  size, or iterative reconstruction technique.    FINDINGS:    LOWER THORAX: Unremarkable     LIVER: Normal.    GALLBLADDER: Normal.    BILIARY: Normal.    PANCREAS: Unremarkable.    SPLEEN: Normal.    ADRENALS: No adrenal mass or nodule.    RENAL/BLADDER: Normal.    REPRODUCTIVE: IUD in the uterine fundus.    GI TRACT: Long segments are questionable mural thickening and pericolonic  inflammatory stranding of the descending and sigmoid colon compatible with acute  colitis. Small bowel unremarkable.     LYMPH NODES: No lymphadenopathy.    PERITONEUM: Unremarkable.     RETROPERITONEUM: Unremarkable.    VASCULATURE: Unremarkable.    ABDOMINAL WALL: Unremarkable.    BONES: No acute osseous abnormality.        Impression    IMPRESSION: Acute colitis  of the descending and sigmoid colon.    Electronically signed by: Albin Felling, MD 06/10/2021 10:20 PM EDT     CBC with Auto Differential   Result Value Ref Range    WBC 8.1 4.0 - 11.0 1000/mm3    RBC 4.40 3.60 - 5.20 M/uL    Hemoglobin 13.4 13.0 - 17.2 gm/dl    Hematocrit 46.9 62.9 - 50.0 %    MCV 96.8 80.0 - 98.0 fL    MCH 30.5 25.4 - 34.6 pg    MCHC 31.5 30.0 - 36.0 gm/dl    Platelets 528 413 - 450 1000/mm3    MPV 11.1 (H) 6.0 - 10.0 fL    RDW 49.4 (H) 36.4 - 46.3      Nucleated RBCs 0 0 - 0      Immature Granulocytes 0.1 0.0 - 3.0 %    Neutrophils Segmented 67.4 (H) 34 - 64 %    Lymphocytes 19.3 (L) 28 - 48 %    Monocytes 11.5 1 - 13 %    Eosinophils 1.2 0 - 5 %    Basophils 0.5 0 - 3 %   CMP   Result Value Ref Range    Potassium 3.6 3.5 - 5.1 mEq/L    Chloride 112 (H) 98 - 107 mEq/L    Sodium 143 136 - 145 mEq/L    CO2 23 20 - 31 mEq/L    Glucose 96 74 - 106 mg/dl    BUN 10 9 - 23 mg/dl    Creatinine 2.44 0.10 - 1.02 mg/dl    GFR African American >60.0  GFR Non-African American >60      Calcium 9.0 8.7 - 10.4 mg/dl    Anion Gap 8 5 - 15 mmol/L    AST 20.0 0.0 - 33.9 U/L    ALT 19 10 - 49 U/L    Alkaline Phosphatase 146 (H) 46 - 116 U/L    Total Bilirubin 0.60 0.30 - 1.20 mg/dl    Total Protein 6.8 5.7 - 8.2 gm/dl    Albumin 3.4 3.4 - 5.0 gm/dl   Lipase   Result Value Ref Range    Lipase 30 12 - 53 U/L   Protime-INR   Result Value Ref Range    Protime 13.0 (H) 10.2 - 12.9 seconds    INR 1.1 0.1 - 1.1     APTT   Result Value Ref Range    aPTT 32.3 25.1 - 36.5 seconds   Microscopic Urinalysis   Result Value Ref Range    Squam Epithel, UA 1-4 /LPF    WBC, UA 1-4 /HPF    RBC, UA OCCASIONAL /HPF    BACTERIA, URINE OCCASIONAL /HPF   POC Pregnancy Urine Qual   Result Value Ref Range    Pregnancy, Urine negative NEGATIVE,Negative,negative     POC Fecal Occult Blood   Result Value Ref Range    Occult Blood Fecal positive (A) NEGATIVE,Negative     POCT Urinalysis no Micro   Result Value Ref Range    Glucose, Ur  Negative NEGATIVE,Negative mg/dl    Bilirubin, Urine Negative NEGATIVE,Negative      Ketones, Urine Negative NEGATIVE,Negative mg/dl    Specific Gravity, Urine 1.010 1.005 - 1.030      Blood, Urine Trace-intact (A) NEGATIVE,Negative      pH, Urine 7.0 5 - 9      Protein, Urine Negative NEGATIVE,Negative mg/dl    Urobilinogen, Urine 0.2 0.0 - 1.0 EU/dl    Nitrite, Urine Negative NEGATIVE,Negative      Leukocyte Esterase, Urine Trace (A) NEGATIVE,Negative      Color, UA Yellow      Clarity, UA Clear         EKG:    Imaging:   Based on my personal interpretation along with Dr. Delton See, CT abdomen and pelvis shows diffuse wall thickening from proximal to the splenic flexure of the colon down to the proximal sigmoid colon.    Other Studies:   My interpretation of other studies is that they show, among other things, no acute blood loss or coagulopathy, urine negative for infection except for trace leukocyte esterase with an unremarkable urinalysis and no irritative voiding symptoms.  White blood cell count normal..      ED Course/Additional MDM     ED Course as of 06/10/21 2327   Sun Jun 10, 2021   2325 Patient declined analgesic medications for her abdomen throughout her stay, does agree with some Tylenol for her headache.  I discussed the patient with Dr. Karie Schwalbe uncle.  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  His recommendation is as long as she does not have a white blood cell count or severe abdominal pain or an acute abdomen to hold antibiotics for now, IV hydration, consider colonoscopy in the morning, trend hemoglobin.  I had asked about lactic acid, he agrees with trending lactic acid as well although may be of less value than clinical trend.  I discussed this with Dr. Earlie Lou who agrees to admit inpatient to MedSurg, as we feel patient is low risk  for severe rapid decompensation as she is not anticoagulated, her last bowel movements from the picture she showed me in the emergency  department showed only small amount of mixed blood with light brown stool, her hemoglobin is stable, and her vital signs are stable as well.    Of note, blood pressure on previous chart reviews is around 110 systolic for her normally, blood pressure here is around 103.  Appears that her blood pressure is not likely significantly lower than normal. [BH]      ED Course User Index  [BH] Renella Cunas, PA-C       INTERNAL/EXTERNAL RECORDS REVIEWED: I reviewed the patient's previous records here at [facility] and available outside facilities, see ED course.     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     Threat to body function without evaluation and management: Potential for acute blood loss    Social Determinants impacting E&M: None    Comorbidities impacting Evaluation and Management: none    Critical Care Time: None    Additional Providers Consulted: Dr. Robby Sermon, patient's gastroenterologist, who outlines recommendation for plan of what he suspects is ischemic colitis, and Dr. Earlie Lou hospitalist who agrees with admission.    I considered the following testing, treatment, or disposition: Discharge home on antibiotics and analgesics but decided not to pursue due to discussion with Dr. Leonard Downing, his concern for ischemic colitis based on her history of similar symptoms with same.    I considered prescribing antibiotics, will hold and appreciate GI recs on this.    Medications   0.9 % sodium chloride bolus (1,000 mLs IntraVENous New Bag 06/10/21 2216)   iopamidol (ISOVUE-300) 61 % injection 85 mL (85 mLs IntraVENous Given 06/10/21 2212)   acetaminophen (TYLENOL) tablet 1,000 mg (1,000 mg Oral Given 06/10/21 2238)       Final Diagnosis     No diagnosis found.     Disposition     Admit inpatient [facility]    The patient was personally  evaluated by myself and discussed with Dr. Delton See who agrees with the above assessment and plan.      Renella Cunas, PA-C  Jun 10, 2021    My signature above authenticates this document and my orders, the final    diagnosis (es), discharge prescription (s), and instructions in the Epic    record.  If you have any questions please contact [phone].     Nursing notes have been reviewed by the physician/ advanced practice    Clinician.                         Renella Cunas, PA-C  06/10/21 2330

## 2021-06-10 NOTE — H&P (Signed)
Medicine History and Physical    Patient: [name] [name] Age: 51 y.o. Sex: female    Date of Birth: 02-Feb-1971 Admit Date: 06/10/2021 PCP: Vivianne Master, MD   MRN: [id number]  CSN: [id number]         Assessment   Acute colitis of the descending and sigmoid colon  Chronic kidney disease (CKD), Stage 3  Chronic anemia  Fibromyalgia  Anxiety       Plan   IVF   Lactic acid   Trend WBC and fever curve   GI consulted   Pain management   Diet NPO  DVT PPX SCDs  ACP: CODE STATUS FULL CODE       Chief Complaint:  Chief Complaint   Patient presents with    Rectal Bleeding         HPI:   [name] [name] is a 51 y.o. year old female with PMH as noted above who presents with abdominal pain, and diarrhea containing blood.   Patient has a hx of ischemic colitis. Patient has had 6 bowel movement containing bright red blood, FOBT positive. Patient is not on [facility]. CT shows colitis as noted below. Lactic acid added. GI consulted and not recommending ABX at this time.     CT abdomen and pelvis shows     Review of Systems - 12 Point ROS -ve except what is noted in the HPI.     Past Medical History:  Past Medical History:   Diagnosis Date    Chronic kidney disease     stage 3    COVID-19 vaccine series completed 05/19/2019    Pfeizer    COVID-19 virus antibody detected 01/2019    Fibromyalgia     Palate mass        Past Surgical History:  Past Surgical History:   Procedure Laterality Date    APPENDECTOMY      CESAREAN SECTION      HERNIA REPAIR      left    OTHER SURGICAL HISTORY      jaw surgery    OVARIAN CYST REMOVAL      SEPTOPLASTY      TUBAL LIGATION         Family History:  Family History   Problem Relation Age of Onset    Heart Disease Father     Breast Cancer Mother     Depression Mother     Kidney Disease Mother        Social History:  Social History     Socioeconomic History    Marital status: Married   Tobacco Use    Smoking status: Never    Smokeless tobacco: Never   Substance and Sexual Activity    Alcohol use: No    Drug use: No        Home Medications:  Prior to Admission medications    Medication Sig Start Date End Date Taking? Authorizing Provider   albuterol sulfate HFA (PROVENTIL;VENTOLIN;PROAIR) 108 (90 Base) MCG/ACT inhaler Inhale 2 puffs into the lungs every 6 hours as needed    Ar Automatic Reconciliation   azelastine HCl 0.15 % SOLN 2 sprays 2 times daily    Ar Automatic Reconciliation   baclofen (LIORESAL) 10 MG tablet Take 10 mg by mouth 3 times daily    Ar Automatic Reconciliation   buPROPion (WELLBUTRIN XL) 300 MG extended release tablet Take 300 mg by mouth    Ar Automatic Reconciliation   butalbital-acetaminophen-caffeine (FIORICET, ESGIC) 50-325-40 MG per  tablet Take 1 tablet by mouth    Ar Automatic Reconciliation   calcium carb-cholecalciferol 600-10 MG-MCG TABS per tab Take 1 tablet by mouth daily    Ar Automatic Reconciliation   dicyclomine (BENTYL) 20 MG tablet Take 20 mg by mouth 3 times daily    Ar Automatic Reconciliation   fluticasone (FLONASE) 50 MCG/ACT nasal spray 2 sprays by Nasal route daily    Ar Automatic Reconciliation   gabapentin (NEURONTIN) 300 MG capsule TAKE 1 CAPSULE BY MOUTH TWICE DAILY 06/22/19   Ar Automatic Reconciliation   gabapentin (NEURONTIN) 800 MG tablet nightly. 06/22/19   Ar Automatic Reconciliation   modafinil (PROVIGIL) 200 MG tablet TAKE 1 TO 2 TABLETS BY MOUTH EVERY DAY 07/30/19   Ar Automatic Reconciliation   topiramate (TOPAMAX) 100 MG tablet Take 300 mg by mouth    Ar Automatic Reconciliation   traZODone (DESYREL) 100 MG tablet Take 100 mg by mouth    Ar Automatic Reconciliation       Allergies:  Allergies   Allergen Reactions    Amoxicillin-Pot Clavulanate Diarrhea    Meperidine Hives and Nausea And Vomiting         Physical Exam:   Visit Vitals  BP 103/60   Pulse 95   Temp 98.7 F (37.1 C) (Oral)   Resp 18   Ht 4\' 11"  (1.499 m)   Wt 121 lb 0.5 oz (54.9 kg)   SpO2 95%   BMI 24.45 kg/m       Physical Exam:  General appearance: alert, cooperative, no distress, appears stated age  Head:  Normocephalic, without obvious abnormality, atraumatic  Neck: supple, trachea midline  Lungs: clear to auscultation bilaterally  Heart: regular rate and rhythm, S1, S2 normal, no murmur, click, rub or gallop  Abdomen: soft, moderate tenderness to mild palpation of left lower quadrant   Extremities: extremities normal, atraumatic, no cyanosis or edema  Skin: Skin color, texture, turgor normal. No rashes or lesions  Neurologic: Grossly normal      Intake and Output:  Current Shift:  No intake/output data recorded.  Last three shifts:  No intake/output data recorded.    Lab/Data Reviewed:  Lab:   Recent Results (from the past 12 hour(s))   CBC with Auto Differential    Collection Time: 06/10/21  8:18 PM   Result Value Ref Range    WBC 8.1 4.0 - 11.0 1000/mm3    RBC 4.40 3.60 - 5.20 M/uL    Hemoglobin 13.4 13.0 - 17.2 gm/dl    Hematocrit 08/10/21 08.6 - 50.0 %    MCV 96.8 80.0 - 98.0 fL    MCH 30.5 25.4 - 34.6 pg    MCHC 31.5 30.0 - 36.0 gm/dl    Platelets 76.1 950 - 450 1000/mm3    MPV 11.1 (H) 6.0 - 10.0 fL    RDW 49.4 (H) 36.4 - 46.3      Nucleated RBCs 0 0 - 0      Immature Granulocytes 0.1 0.0 - 3.0 %    Neutrophils Segmented 67.4 (H) 34 - 64 %    Lymphocytes 19.3 (L) 28 - 48 %    Monocytes 11.5 1 - 13 %    Eosinophils 1.2 0 - 5 %    Basophils 0.5 0 - 3 %   CMP    Collection Time: 06/10/21  8:18 PM   Result Value Ref Range    Potassium 3.6 3.5 - 5.1 mEq/L    Chloride 112 (H) 98 -  107 mEq/L    Sodium 143 136 - 145 mEq/L    CO2 23 20 - 31 mEq/L    Glucose 96 74 - 106 mg/dl    BUN 10 9 - 23 mg/dl    Creatinine 9.47 0.96 - 1.02 mg/dl    GFR African American >60.0      GFR Non-African American >60      Calcium 9.0 8.7 - 10.4 mg/dl    Anion Gap 8 5 - 15 mmol/L    AST 20.0 0.0 - 33.9 U/L    ALT 19 10 - 49 U/L    Alkaline Phosphatase 146 (H) 46 - 116 U/L    Total Bilirubin 0.60 0.30 - 1.20 mg/dl    Total Protein 6.8 5.7 - 8.2 gm/dl    Albumin 3.4 3.4 - 5.0 gm/dl   Lipase    Collection Time: 06/10/21  8:18 PM   Result Value Ref  Range    Lipase 30 12 - 53 U/L   Protime-INR    Collection Time: 06/10/21  8:18 PM   Result Value Ref Range    Protime 13.0 (H) 10.2 - 12.9 seconds    INR 1.1 0.1 - 1.1     APTT    Collection Time: 06/10/21  8:18 PM   Result Value Ref Range    aPTT 32.3 25.1 - 36.5 seconds   POC Pregnancy Urine Qual    Collection Time: 06/10/21  8:53 PM   Result Value Ref Range    Pregnancy, Urine negative NEGATIVE,Negative,negative     POC Fecal Occult Blood    Collection Time: 06/10/21  8:54 PM   Result Value Ref Range    Occult Blood Fecal positive (A) NEGATIVE,Negative     POCT Urinalysis no Micro    Collection Time: 06/10/21  8:56 PM   Result Value Ref Range    Glucose, Ur Negative NEGATIVE,Negative mg/dl    Bilirubin, Urine Negative NEGATIVE,Negative      Ketones, Urine Negative NEGATIVE,Negative mg/dl    Specific Gravity, Urine 1.010 1.005 - 1.030      Blood, Urine Trace-intact (A) NEGATIVE,Negative      pH, Urine 7.0 5 - 9      Protein, Urine Negative NEGATIVE,Negative mg/dl    Urobilinogen, Urine 0.2 0.0 - 1.0 EU/dl    Nitrite, Urine Negative NEGATIVE,Negative      Leukocyte Esterase, Urine Trace (A) NEGATIVE,Negative      Color, UA Yellow      Clarity, UA Clear     Microscopic Urinalysis    Collection Time: 06/10/21  8:56 PM   Result Value Ref Range    Squam Epithel, UA 1-4 /LPF    WBC, UA 1-4 /HPF    RBC, UA OCCASIONAL /HPF    BACTERIA, URINE OCCASIONAL /HPF       Irving Burton, MD  Jun 10, 2021

## 2021-06-10 NOTE — ED Triage Notes (Signed)
Patient states that she had diarrhea starting at 5am that changed over to blood at some point during the day.

## 2021-06-10 NOTE — ED Notes (Signed)
TRANSFER - OUT REPORT:    Verbal report given to wendy on [facility]  being transferred to 2306 for routine progression of patient care       Report consisted of patient's Situation, Background, Assessment and   Recommendations(SBAR).     Information from the following report(s) Nurse Handoff Report, ED Encounter Summary, ED SBAR, Intake/Output, MAR, and Recent Results was reviewed with the receiving nurse.  Kinder Assessment: Presents to emergency department  because of falls (Syncope, seizure, or loss of consciousness): No, Age > 70: No, Altered Mental Status, Intoxication with alcohol or substance confusion (Disorientation, impaired judgment, poor safety awaremess, or inability to follow instructions): No, Impaired Mobility: Ambulates or transfers with assistive devices or assistance; Unable to ambulate or transer.: No  Lines:   Peripheral IV 06/10/21 Left Antecubital (Active)   Site Assessment Clean, dry & intact 06/10/21 2014   Line Status Specimen collected 06/10/21 2014   Phlebitis Assessment No symptoms 06/10/21 2014   Infiltration Assessment 0 06/10/21 2014   Alcohol Cap Used Yes 06/10/21 2014   Dressing Type Transparent 06/10/21 2014        Opportunity for questions and clarification was provided.      Patient transported with:  Danny Lawless, RN  06/10/21 [phone]

## 2021-06-11 LAB — LACTIC ACID
Lactate: 0.6 mmol/L (ref 0.5–2.2)
Lactate: 0.7 mmol/L (ref 0.5–2.2)

## 2021-06-11 LAB — COMPREHENSIVE METABOLIC PANEL
ALT: 19 U/L (ref 10–49)
AST: 20 U/L (ref 0.0–33.9)
Albumin: 3.4 gm/dl (ref 3.4–5.0)
Alkaline Phosphatase: 146 U/L — ABNORMAL HIGH (ref 46–116)
Anion Gap: 8 mmol/L (ref 5–15)
BUN: 10 mg/dl (ref 9–23)
CO2: 23 mEq/L (ref 20–31)
Calcium: 9 mg/dl (ref 8.7–10.4)
Chloride: 112 mEq/L — ABNORMAL HIGH (ref 98–107)
Creatinine: 1.01 mg/dl (ref 0.55–1.02)
GFR African American: >60
GFR Non-African American: >60
Glucose: 96 mg/dl (ref 74–106)
Potassium: 3.6 mEq/L (ref 3.5–5.1)
Sodium: 143 mEq/L (ref 136–145)
Total Bilirubin: 0.6 mg/dl (ref 0.30–1.20)
Total Protein: 6.8 gm/dl (ref 5.7–8.2)

## 2021-06-11 LAB — CBC WITH AUTO DIFFERENTIAL
Basophils: 0.5 % (ref 0–3)
Basophils: 0.3 % (ref 0–3)
Eosinophils: 1.2 % (ref 0–5)
Eosinophils: 2.2 % (ref 0–5)
Hematocrit: 42.6 % (ref 37.0–50.0)
Hematocrit: 36.2 % — ABNORMAL LOW (ref 37.0–50.0)
Hemoglobin: 13.4 gm/dl (ref 13.0–17.2)
Hemoglobin: 11.1 gm/dl — ABNORMAL LOW (ref 13.0–17.2)
Immature Granulocytes: 0.1 % (ref 0.0–3.0)
Immature Granulocytes: 0.2 % (ref 0.0–3.0)
Lymphocytes: 19.3 % — ABNORMAL LOW (ref 28–48)
Lymphocytes: 29.9 % (ref 28–48)
MCH: 30.5 pg (ref 25.4–34.6)
MCH: 30.2 pg (ref 25.4–34.6)
MCHC: 31.5 gm/dl (ref 30.0–36.0)
MCHC: 30.7 gm/dl (ref 30.0–36.0)
MCV: 96.8 fL (ref 80.0–98.0)
MCV: 98.4 fL — ABNORMAL HIGH (ref 80.0–98.0)
MPV: 11.1 fL — ABNORMAL HIGH (ref 6.0–10.0)
MPV: 10.8 fL — ABNORMAL HIGH (ref 6.0–10.0)
Monocytes: 11.5 % (ref 1–13)
Monocytes: 11.1 % (ref 1–13)
Neutrophils Segmented: 67.4 % — ABNORMAL HIGH (ref 34–64)
Neutrophils Segmented: 56.3 % (ref 34–64)
Nucleated RBCs: 0 (ref 0–0)
Nucleated RBCs: 0 (ref 0–0)
Platelets: 282 10*3/uL (ref 140–450)
Platelets: 239 10*3/uL (ref 140–450)
RBC: 4.4 M/uL (ref 3.60–5.20)
RBC: 3.68 M/uL (ref 3.60–5.20)
RDW: 49.4 — ABNORMAL HIGH (ref 36.4–46.3)
RDW: 50.6 — ABNORMAL HIGH (ref 36.4–46.3)
WBC: 8.1 10*3/uL (ref 4.0–11.0)
WBC: 9.3 10*3/uL (ref 4.0–11.0)

## 2021-06-11 LAB — RENAL FUNCTION PANEL
Albumin: 2.9 gm/dl — ABNORMAL LOW (ref 3.4–5.0)
Anion Gap: 10 mmol/L (ref 5–15)
BUN: 7 mg/dl — ABNORMAL LOW (ref 9–23)
CO2: 20 mEq/L (ref 20–31)
Calcium: 8.3 mg/dl — ABNORMAL LOW (ref 8.7–10.4)
Chloride: 114 mEq/L — ABNORMAL HIGH (ref 98–107)
Creatinine: 1.08 mg/dl — ABNORMAL HIGH (ref 0.55–1.02)
GFR African American: >60
GFR Non-African American: 56
Glucose: 92 mg/dl (ref 74–106)
Phosphorus: 2.4 mg/dL (ref 2.4–5.1)
Potassium: 3.6 mEq/L (ref 3.5–5.1)
Sodium: 144 mEq/L (ref 136–145)

## 2021-06-11 LAB — GIARDIA / CRYPTOSPORIDUM ANTIGENS
Cryptosporidium Ag: NEGATIVE
Giardia Ag, Stl: NEGATIVE

## 2021-06-11 LAB — POCT URINALYSIS DIPSTICK
Bilirubin, Urine: NEGATIVE
Glucose, Ur: NEGATIVE mg/dl
Ketones, Urine: NEGATIVE mg/dl
Nitrite, Urine: NEGATIVE
Protein, Urine: NEGATIVE mg/dl
Specific Gravity, Urine: 1.01 (ref 1.005–1.030)
Urobilinogen, Urine: 0.2 EU/dl (ref 0.0–1.0)
pH, Urine: 7 (ref 5–9)

## 2021-06-11 LAB — HEMOGLOBIN AND HEMATOCRIT
Hematocrit: 36 % — ABNORMAL LOW (ref 37.0–50.0)
Hemoglobin: 11.2 gm/dl — ABNORMAL LOW (ref 13.0–17.2)

## 2021-06-11 LAB — BASIC METABOLIC PANEL W/ REFLEX TO MG FOR LOW K
Anion Gap: 9 mmol/L (ref 5–15)
BUN: 8 mg/dl — ABNORMAL LOW (ref 9–23)
CO2: 20 mEq/L (ref 20–31)
Calcium: 8.5 mg/dl — ABNORMAL LOW (ref 8.7–10.4)
Chloride: 114 mEq/L — ABNORMAL HIGH (ref 98–107)
Creatinine: 1.1 mg/dl — ABNORMAL HIGH (ref 0.55–1.02)
Glucose: 93 mg/dl (ref 74–106)
Potassium: 3.5 mEq/L (ref 3.5–5.1)
Sodium: 143 mEq/L (ref 136–145)

## 2021-06-11 LAB — LIPASE: Lipase: 30 U/L (ref 12–53)

## 2021-06-11 LAB — MICROSCOPIC URINALYSIS

## 2021-06-11 LAB — APTT: aPTT: 32.3 seconds (ref 25.1–36.5)

## 2021-06-11 LAB — PROTIME-INR
INR: 1.1 (ref 0.1–1.1)
Protime: 13 seconds — ABNORMAL HIGH (ref 10.2–12.9)

## 2021-06-11 LAB — POC FECAL OCCULT BLOOD: Occult Blood Fecal: POSITIVE — AB

## 2021-06-11 LAB — POC PREGNANCY UR-QUAL: Pregnancy, Urine: NEGATIVE

## 2021-06-11 MED ORDER — ACETAMINOPHEN 650 MG RE SUPP
650 | Freq: Four times a day (QID) | RECTAL | Status: DC | PRN
Start: 2021-06-11 — End: 2021-06-11

## 2021-06-11 MED ORDER — BENZONATATE 100 MG PO CAPS
100 MG | Freq: Three times a day (TID) | ORAL | Status: AC | PRN
Start: 2021-06-11 — End: 2021-06-14

## 2021-06-11 MED ORDER — CIPROFLOXACIN IN D5W 400 MG/200ML IV SOLN
400 MG/200ML | Freq: Two times a day (BID) | INTRAVENOUS | Status: AC
Start: 2021-06-11 — End: 2021-06-13
  Administered 2021-06-11 – 2021-06-13 (×5): 400 mg via INTRAVENOUS

## 2021-06-11 MED ORDER — SODIUM CHLORIDE 0.9 % IV SOLN
0.9 % | INTRAVENOUS | Status: AC
Start: 2021-06-11 — End: 2021-06-12
  Administered 2021-06-11: 05:00:00 via INTRAVENOUS

## 2021-06-11 MED ORDER — IOPAMIDOL 61 % IV SOLN
61 % | Freq: Once | INTRAVENOUS | Status: AC | PRN
Start: 2021-06-11 — End: 2021-06-10
  Administered 2021-06-11: 02:00:00 85 mL via INTRAVENOUS

## 2021-06-11 MED ORDER — MELATONIN 3 MG PO TABS
3 MG | Freq: Every evening | ORAL | Status: AC | PRN
Start: 2021-06-11 — End: 2021-06-14

## 2021-06-11 MED ORDER — SIMETHICONE 80 MG PO CHEW
80 MG | Freq: Four times a day (QID) | ORAL | Status: AC | PRN
Start: 2021-06-11 — End: 2021-06-14

## 2021-06-11 MED ORDER — TOPIRAMATE 100 MG PO TABS
100 MG | Freq: Every day | ORAL | Status: AC
Start: 2021-06-11 — End: 2021-06-14
  Administered 2021-06-11 – 2021-06-14 (×4): 300 mg via ORAL

## 2021-06-11 MED ORDER — NORMAL SALINE FLUSH 0.9 % IV SOLN
0.9 % | Freq: Two times a day (BID) | INTRAVENOUS | Status: AC
Start: 2021-06-11 — End: 2021-06-12
  Administered 2021-06-11 – 2021-06-12 (×3): 10 mL via INTRAVENOUS

## 2021-06-11 MED ORDER — TRAZODONE HCL 100 MG PO TABS
100 MG | Freq: Every evening | ORAL | Status: AC
Start: 2021-06-11 — End: 2021-06-14
  Administered 2021-06-12 – 2021-06-14 (×3): 100 mg via ORAL

## 2021-06-11 MED ORDER — METRONIDAZOLE 500 MG/100ML IV SOLN
500 MG/100ML | Freq: Three times a day (TID) | INTRAVENOUS | Status: AC
Start: 2021-06-11 — End: 2021-06-13
  Administered 2021-06-11 – 2021-06-13 (×7): 500 mg via INTRAVENOUS

## 2021-06-11 MED ORDER — ONDANSETRON HCL 4 MG/2ML IJ SOLN
4 MG/2ML | Freq: Four times a day (QID) | INTRAMUSCULAR | Status: AC | PRN
Start: 2021-06-11 — End: 2021-06-14
  Administered 2021-06-11 (×2): 4 mg via INTRAVENOUS

## 2021-06-11 MED ORDER — IPRATROPIUM-ALBUTEROL 0.5-2.5 (3) MG/3ML IN SOLN
0.5-2.533 (3) MG/3ML | RESPIRATORY_TRACT | Status: AC | PRN
Start: 2021-06-11 — End: 2021-06-14

## 2021-06-11 MED ORDER — ONDANSETRON 4 MG PO TBDP
4 MG | Freq: Three times a day (TID) | ORAL | Status: AC | PRN
Start: 2021-06-11 — End: 2021-06-14
  Administered 2021-06-14 (×2): 4 mg via ORAL

## 2021-06-11 MED ORDER — MORPHINE SULFATE (PF) 2 MG/ML IV SOLN
2 MG/ML | INTRAVENOUS | Status: AC | PRN
Start: 2021-06-11 — End: 2021-06-14
  Administered 2021-06-11 – 2021-06-12 (×2): 2 mg via INTRAVENOUS

## 2021-06-11 MED ORDER — SODIUM CHLORIDE 0.9 % IV SOLN
0.9 % | INTRAVENOUS | Status: AC | PRN
Start: 2021-06-11 — End: 2021-06-14

## 2021-06-11 MED ORDER — POLYETHYLENE GLYCOL 3350 17 G PO PACK
17 g | Freq: Every day | ORAL | Status: AC | PRN
Start: 2021-06-11 — End: 2021-06-14

## 2021-06-11 MED ORDER — PREGABALIN 75 MG PO CAPS
75 MG | Freq: Two times a day (BID) | ORAL | Status: AC
Start: 2021-06-11 — End: 2021-06-14
  Administered 2021-06-11 – 2021-06-14 (×7): 75 mg via ORAL

## 2021-06-11 MED ORDER — DICYCLOMINE HCL 10 MG PO CAPS
10 MG | Freq: Three times a day (TID) | ORAL | Status: AC
Start: 2021-06-11 — End: 2021-06-14
  Administered 2021-06-11 – 2021-06-14 (×11): 20 mg via ORAL

## 2021-06-11 MED ORDER — ACETAMINOPHEN 325 MG PO TABS
325 | Freq: Four times a day (QID) | ORAL | Status: DC | PRN
Start: 2021-06-11 — End: 2021-06-14
  Administered 2021-06-11 – 2021-06-14 (×6): 650 mg via ORAL

## 2021-06-11 MED ORDER — DIPHENHYDRAMINE HCL 25 MG PO CAPS
25 MG | Freq: Four times a day (QID) | ORAL | Status: AC | PRN
Start: 2021-06-11 — End: 2021-06-14

## 2021-06-11 MED ORDER — SODIUM CHLORIDE 0.9 % IV BOLUS
0.9 % | Freq: Once | INTRAVENOUS | Status: AC
Start: 2021-06-11 — End: 2021-06-11
  Administered 2021-06-11: 02:00:00 1000 mL via INTRAVENOUS

## 2021-06-11 MED ORDER — BUPROPION HCL ER (XL) 150 MG PO TB24
150 MG | Freq: Every day | ORAL | Status: AC
Start: 2021-06-11 — End: 2021-06-14
  Administered 2021-06-11 – 2021-06-14 (×4): 300 mg via ORAL

## 2021-06-11 MED ORDER — ACETAMINOPHEN 500 MG PO TABS
500 MG | ORAL | Status: AC
Start: 2021-06-11 — End: 2021-06-10
  Administered 2021-06-11: 03:00:00 1000 mg via ORAL

## 2021-06-11 MED ORDER — NORMAL SALINE FLUSH 0.9 % IV SOLN
0.9 % | INTRAVENOUS | Status: AC | PRN
Start: 2021-06-11 — End: 2021-06-14

## 2021-06-11 MED ORDER — GABAPENTIN 300 MG PO CAPS
300 MG | Freq: Two times a day (BID) | ORAL | Status: AC
Start: 2021-06-11 — End: 2021-06-11

## 2021-06-11 MED FILL — DICYCLOMINE HCL 10 MG PO CAPS: 10 MG | ORAL | Qty: 2

## 2021-06-11 MED FILL — METRONIDAZOLE 500 MG/100ML IV SOLN: 500 MG/100ML | INTRAVENOUS | Qty: 100

## 2021-06-11 MED FILL — SODIUM CHLORIDE 0.9 % IV SOLN: 0.9 % | INTRAVENOUS | Qty: 1000

## 2021-06-11 MED FILL — SODIUM CHLORIDE FLUSH 0.9 % IV SOLN: 0.9 % | INTRAVENOUS | Qty: 10

## 2021-06-11 MED FILL — ACETAMINOPHEN 325 MG PO TABS: 325 MG | ORAL | Qty: 2

## 2021-06-11 MED FILL — BUPROPION HCL ER (XL) 150 MG PO TB24: 150 MG | ORAL | Qty: 2

## 2021-06-11 MED FILL — CIPROFLOXACIN IN D5W 400 MG/200ML IV SOLN: 400 MG/200ML | INTRAVENOUS | Qty: 200

## 2021-06-11 MED FILL — ACETAMINOPHEN EXTRA STRENGTH 500 MG PO TABS: 500 MG | ORAL | Qty: 2

## 2021-06-11 MED FILL — ONDANSETRON HCL 4 MG/2ML IJ SOLN: 4 MG/2ML | INTRAMUSCULAR | Qty: 2

## 2021-06-11 MED FILL — ISOVUE-300 61 % IV SOLN: 61 % | INTRAVENOUS | Qty: 85

## 2021-06-11 MED FILL — MORPHINE SULFATE 2 MG/ML IJ SOLN: 2 mg/mL | INTRAMUSCULAR | Qty: 1

## 2021-06-11 MED FILL — PREGABALIN 75 MG PO CAPS: 75 MG | ORAL | Qty: 1

## 2021-06-11 MED FILL — TOPIRAMATE 100 MG PO TABS: 100 MG | ORAL | Qty: 3

## 2021-06-11 NOTE — Progress Notes (Incomplete)
Hospitalist Progress Note  [facility]    Daily Progress Note: 06/11/2021    Assessment/Plan:     Acute descending and sigmoid colitis  Chronic anemia  Fibromyalgia  Anxiety   No CKD    DVT Prophylaxis      Code status. Full Code    Disposition      Patient contact:    ------------------     Chart reviewed including notes, labs, imaging, studies     D/w floor RN regarding current POC and pt's condition     D/w pt and family regarding pt's condition and current POC    ------------------  Physical exam:  General: *** Alert, NAD, pleasant.  Cooperative  HEENT: ***Sclera anicteric, PERRL, OM moist, throat clear.  Chest: *** CTA B, no wheeze, no rales, no rhonchi.  Good air movement.  CV: ***RRR, S1/S2 were normal, no murmur  Abdomen: ***NTND, soft, NABS, no masses were noted.  No rebound, no guarding.  Extremities: *** edema.    Subjective:     ***    Objective:     BP 98/60   Pulse 70   Temp 96.8 F (36 C) (Temporal)   Resp 18   Ht 4\' 11"  (1.499 m)   Wt 121 lb 0.5 oz (54.9 kg)   SpO2 97%   BMI 24.45 kg/m         Temp (24hrs), Avg:98.5 F (36.9 C), Min:96.8 F (36 C), Max:99 F (37.2 C)    No intake/output data recorded.   05/07 0701 - 05/08 1900  In: 360 [P.O.:360]  Out: -       Data Review:       Recent Days:  Recent Labs     06/10/21  2018 06/11/21  0600 06/11/21  1634   WBC 8.1 9.3  --    HGB 13.4 11.1* 11.2*   HCT 42.6 36.2* 36.0*   PLT 282 239  --      Recent Labs     06/10/21  2018 06/11/21  1021   NA 143 144  143   K 3.6 3.6  3.5   CL 112* 114*  114*   CO2 23 20  20    BUN 10 7*  8*   PHOS  --  2.4   ALT 19  --    INR 1.1  --      No results for input(s): PH, PCO2, PO2, HCO3, FIO2 in the last 72 hours.    24 Hour Results:  Recent Results (from the past 24 hour(s))   CBC with Auto Differential    Collection Time: 06/10/21  8:18 PM   Result Value Ref Range    WBC 8.1 4.0 - 11.0 1000/mm3    RBC 4.40 3.60 - 5.20 M/uL    Hemoglobin 13.4 13.0 - 17.2 gm/dl    Hematocrit 13.0 86.5 - 50.0 %     MCV 96.8 80.0 - 98.0 fL    MCH 30.5 25.4 - 34.6 pg    MCHC 31.5 30.0 - 36.0 gm/dl    Platelets 784 696 - 450 1000/mm3    MPV 11.1 (H) 6.0 - 10.0 fL    RDW 49.4 (H) 36.4 - 46.3      Nucleated RBCs 0 0 - 0      Immature Granulocytes 0.1 0.0 - 3.0 %    Neutrophils Segmented 67.4 (H) 34 - 64 %    Lymphocytes 19.3 (L) 28 - 48 %    Monocytes 11.5 1 -  13 %    Eosinophils 1.2 0 - 5 %    Basophils 0.5 0 - 3 %   CMP    Collection Time: 06/10/21  8:18 PM   Result Value Ref Range    Potassium 3.6 3.5 - 5.1 mEq/L    Chloride 112 (H) 98 - 107 mEq/L    Sodium 143 136 - 145 mEq/L    CO2 23 20 - 31 mEq/L    Glucose 96 74 - 106 mg/dl    BUN 10 9 - 23 mg/dl    Creatinine 8.75 6.43 - 1.02 mg/dl    GFR African American >60.0      GFR Non-African American >60      Calcium 9.0 8.7 - 10.4 mg/dl    Anion Gap 8 5 - 15 mmol/L    AST 20.0 0.0 - 33.9 U/L    ALT 19 10 - 49 U/L    Alkaline Phosphatase 146 (H) 46 - 116 U/L    Total Bilirubin 0.60 0.30 - 1.20 mg/dl    Total Protein 6.8 5.7 - 8.2 gm/dl    Albumin 3.4 3.4 - 5.0 gm/dl   Lipase    Collection Time: 06/10/21  8:18 PM   Result Value Ref Range    Lipase 30 12 - 53 U/L   Protime-INR    Collection Time: 06/10/21  8:18 PM   Result Value Ref Range    Protime 13.0 (H) 10.2 - 12.9 seconds    INR 1.1 0.1 - 1.1     APTT    Collection Time: 06/10/21  8:18 PM   Result Value Ref Range    aPTT 32.3 25.1 - 36.5 seconds   POC Pregnancy Urine Qual    Collection Time: 06/10/21  8:53 PM   Result Value Ref Range    Pregnancy, Urine negative NEGATIVE,Negative,negative     POC Fecal Occult Blood    Collection Time: 06/10/21  8:54 PM   Result Value Ref Range    Occult Blood Fecal positive (A) NEGATIVE,Negative     POCT Urinalysis no Micro    Collection Time: 06/10/21  8:56 PM   Result Value Ref Range    Glucose, Ur Negative NEGATIVE,Negative mg/dl    Bilirubin, Urine Negative NEGATIVE,Negative      Ketones, Urine Negative NEGATIVE,Negative mg/dl    Specific Gravity, Urine 1.010 1.005 - 1.030      Blood, Urine  Trace-intact (A) NEGATIVE,Negative      pH, Urine 7.0 5 - 9      Protein, Urine Negative NEGATIVE,Negative mg/dl    Urobilinogen, Urine 0.2 0.0 - 1.0 EU/dl    Nitrite, Urine Negative NEGATIVE,Negative      Leukocyte Esterase, Urine Trace (A) NEGATIVE,Negative      Color, UA Yellow      Clarity, UA Clear     Microscopic Urinalysis    Collection Time: 06/10/21  8:56 PM   Result Value Ref Range    Squam Epithel, UA 1-4 /LPF    WBC, UA 1-4 /HPF    RBC, UA OCCASIONAL /HPF    BACTERIA, URINE OCCASIONAL /HPF   Lactic Acid    Collection Time: 06/10/21 11:00 PM   Result Value Ref Range    Lactate 0.6 0.5 - 2.2 mmol/L   Lactic Acid    Collection Time: 06/11/21  6:00 AM   Result Value Ref Range    Lactate 0.7 0.5 - 2.2 mmol/L   CBC with Auto Differential    Collection Time: 06/11/21  6:00 AM   Result Value Ref Range    WBC 9.3 4.0 - 11.0 1000/mm3    RBC 3.68 3.60 - 5.20 M/uL    Hemoglobin 11.1 (L) 13.0 - 17.2 gm/dl    Hematocrit 81.1 (L) 37.0 - 50.0 %    MCV 98.4 (H) 80.0 - 98.0 fL    MCH 30.2 25.4 - 34.6 pg    MCHC 30.7 30.0 - 36.0 gm/dl    Platelets 914 782 - 450 1000/mm3    MPV 10.8 (H) 6.0 - 10.0 fL    RDW 50.6 (H) 36.4 - 46.3      Nucleated RBCs 0 0 - 0      Immature Granulocytes 0.2 0.0 - 3.0 %    Neutrophils Segmented 56.3 34 - 64 %    Lymphocytes 29.9 28 - 48 %    Monocytes 11.1 1 - 13 %    Eosinophils 2.2 0 - 5 %    Basophils 0.3 0 - 3 %   Basic Metabolic Panel w/ Reflex to MG    Collection Time: 06/11/21 10:21 AM   Result Value Ref Range    Potassium 3.5 3.5 - 5.1 mEq/L    Chloride 114 (H) 98 - 107 mEq/L    Sodium 143 136 - 145 mEq/L    CO2 20 20 - 31 mEq/L    Glucose 93 74 - 106 mg/dl    BUN 8 (L) 9 - 23 mg/dl    Creatinine 9.56 (H) 0.55 - 1.02 mg/dl    Calcium 8.5 (L) 8.7 - 10.4 mg/dl    Anion Gap 9 5 - 15 mmol/L   Renal Function Panel    Collection Time: 06/11/21 10:21 AM   Result Value Ref Range    Potassium 3.6 3.5 - 5.1 mEq/L    Chloride 114 (H) 98 - 107 mEq/L    Sodium 144 136 - 145 mEq/L    CO2 20 20 - 31  mEq/L    Glucose 92 74 - 106 mg/dl    BUN 7 (L) 9 - 23 mg/dl    Creatinine 2.13 (H) 0.55 - 1.02 mg/dl    GFR African American >60.0      GFR Non-African American 56      Calcium 8.3 (L) 8.7 - 10.4 mg/dl    Anion Gap 10 5 - 15 mmol/L    Albumin 2.9 (L) 3.4 - 5.0 gm/dl    Phosphorus 2.4 2.4 - 5.1 mg/dL   Giardia / Cryptosporidum antigens    Collection Time: 06/11/21  4:25 PM   Result Value Ref Range    Cryptosporidium Ag NEGATIVE NEGATIVE      Giardia Ag, Stl NEGATIVE NEGATIVE     Hemoglobin and Hematocrit    Collection Time: 06/11/21  4:34 PM   Result Value Ref Range    Hemoglobin 11.2 (L) 13.0 - 17.2 gm/dl    Hematocrit 08.6 (L) 37.0 - 50.0 %       CT ABDOMEN PELVIS W IV CONTRAST Additional Contrast? None    Result Date: 06/10/2021  EXAMINATION: CT ABDOMEN PELVIS W IV CONTRAST INDICATION:  evaluate colitis - diarrhea, rectal bleed, abdominal pain, fever; hx appendectomy COMPARISON: 05/26/2019 WORKSTATION ID: [id number] TECHNIQUE: Multiplanar CT of the abdomen and pelvis was performed with IV contrast. All CT exams at this facility use one or more dose reduction techniques including automatic exposure control, mA/kV adjustment per patient's size, or iterative reconstruction technique. FINDINGS: LOWER THORAX: Unremarkable LIVER: Normal. GALLBLADDER: Normal. BILIARY: Normal. PANCREAS:  Unremarkable. SPLEEN: Normal. ADRENALS: No adrenal mass or nodule. RENAL/BLADDER: Normal. REPRODUCTIVE: IUD in the uterine fundus. GI TRACT: Long segments are questionable mural thickening and pericolonic inflammatory stranding of the descending and sigmoid colon compatible with acute colitis. Small bowel unremarkable. LYMPH NODES: No lymphadenopathy. PERITONEUM: Unremarkable. RETROPERITONEUM: Unremarkable. VASCULATURE: Unremarkable. ABDOMINAL WALL: Unremarkable. BONES: No acute osseous abnormality.     IMPRESSION: Acute colitis of the descending and sigmoid colon. Electronically signed by: Albin Felling, MD 06/10/2021 10:20 PM EDT      Vascular duplex abdominal visceral arteries/veins/organs complete    Result Date: 06/11/2021  [id number]                                                           Study ID: [id number]                                        [facility]                                           [address] [place],                                         [place]                                          [address]                          Mesenteric Duplex Report Name: [name] [name], [name] [name] Date: 06/11/2021 12:27 PM MRN: [id number]                   Patient Location: [id number]  DOB: [birth year]               Age: 51 yrs Gender: Female                Account #: [id number] Ordering Physician: Elenor Legato Performed By: Janee Morn, RVT Interpretation Summary Patent celiac, superior mesenteric and inferior mesenteric arteries without evidence of hemodyanmically significant stenosis. ___________________________________________________________________________ Procedure A duplex exam of the mesenteric arteries was performed to determine the presence or absence of mesenteric artery occlusive disease. History/Symptoms Diarrhea. Celiac Artery The velocity of the Celiac Artery is 167 cm/s. The proximal celiac artery, splenic artery and hepatic artery were not well visualized. Proximal Superior Mesenteric Artery The velocity of the proximal Superior Mesenteric Artery is 143 cm/sec. There is no evidence of stenosis involving the proximal mesenteric artery. Mid Superior Mesenteric Artery The velocity of the mid Superior Mesenteric Artery is 142 cm/sec. There is no evidence of stenosis involving the mid mesenteric artery. Distal Superior Mesenteric Artery The velocity of the distal Superior Mesenteric  Artery is 107 cm/sec. There is no evidence of stenosis involving the distal mesenteric artery. Proximal Inferior Mesenteric Artery The velocity of the proximal Inferior Mesenteric Artery is 168 cm/sec. Proximal Abdominal Aorta The proximal abdominal aortic velocity is 135 cm/s. The proximal abdominal aortic diameter is 1.64 cm. Sonographer comments When compared to previous study of 02/20/2016, there has been no interval progression. Electronically signed byDR Billie Ruddy, MD   06/11/2021 05:01 PM       Microbiology:  @MICRORESULTS @     Cardiology:  @LASTCARDIOLOGY @      Problem List:      Medications reviewed  Current Facility-Administered Medications   Medication Dose Route Frequency    topiramate (TOPAMAX) tablet 300 mg  300 mg Oral Daily    dicyclomine (BENTYL) capsule 20 mg  20 mg Oral TID    buPROPion (WELLBUTRIN XL) extended release tablet 300 mg  300 mg Oral Daily    sodium chloride flush 0.9 % injection 10 mL  10 mL IntraVENous 2 times per day    sodium chloride flush 0.9 % injection 10 mL  10 mL IntraVENous PRN    0.9 % sodium chloride infusion   IntraVENous PRN    ondansetron (ZOFRAN-ODT) disintegrating tablet 4 mg  4 mg Oral Q8H PRN    Or    ondansetron (ZOFRAN) injection 4 mg  4 mg IntraVENous Q6H PRN    polyethylene glycol (GLYCOLAX) packet 17 g  17 g Oral Daily PRN    acetaminophen (TYLENOL) tablet 650 mg  650 mg Oral Q6H PRN    0.9 % sodium chloride infusion   IntraVENous Continuous    morphine (PF) injection 2 mg  2 mg IntraVENous Q4H PRN    traZODone (DESYREL) tablet 100 mg  100 mg Oral Nightly    pregabalin (LYRICA) capsule 75 mg  75 mg Oral BID    ipratropium-albuterol (DUONEB) nebulizer solution 1 ampule  1 ampule Inhalation Q4H PRN    simethicone (MYLICON) chewable tablet 80 mg  80 mg Oral Q6H PRN    benzonatate (TESSALON) capsule 100 mg  100 mg Oral TID PRN    diphenhydrAMINE (BENADRYL) capsule 25 mg  25 mg Oral Q6H PRN    melatonin tablet 3 mg  3 mg Oral Nightly PRN    ciprofloxacin  (CIPRO) IVPB 400 mg  400 mg IntraVENous Q12H    metronidazole (FLAGYL) 500 mg in 0.9% NaCl 100 mL IVPB premix  500 mg IntraVENous Q8H  Alvy Bimler, MD

## 2021-06-11 NOTE — Consults (Signed)
Gastroenterology Consult    Patient: [name] [name] MRN: [id number]  SSN: [id number]    Date of Birth: 1970-10-11  Age: 51 y.o.  Sex: female      Assessment:   --Acute bloody diarrhea with lower abdominal pain- hx of ischemic colitis and overall picture most consistent with ischemic colitis. Other Ddx includes IBD, other infectious process, IBS with hemorrhoids.   -Colonoscopy per Dr. Leonard Downing in 2021 which showed scarring of the splenic flexure and proximal descending colon consisting with healed ischemic colitis.    -CT abd/pelvis 06/10/21 showed acute colitis of the descending and sigmoid colon.  --Normocytic anemia- secondary to blood loss. 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    Plan:   --Clinical picture consistent with ischemic colitis, in addition to patient's history of this and will treat as such.  --Recommend Cipro/Flagyl. Continue Bentyl for abdominal pain  --Continue to monitor for bleeding. This seems to be improving. If bleeding worsens or if Hgb continues to drop, may consider inpatient colonoscopy  --Repeat mesenteric duplex  --Clear liquid diet for now.   --Stool studies ordered.  --Monitor Hgb/Hct and transfuse as needed to keep Hgb >8    Subjective:      [name] [name] is a 51 y.o. female who is being seen for rectal bleeding. She has a history of Celiac disease, IBS-D, Gastroparesis and ischemic colitis. She reports waking up in the early AM yesterday with blood diarrhea and proceeded to have multiple bouts of this accompanied by lower abdominal pain, nausea. She kept NPO, she reports calling our office, told to stay on a clear liquid diet to see if symptoms improved. Upon starting the liquids she started to have bloody diarrhea again. On admission BP was on the lower side. Labs on admission showed a Hgb 13.4 g/dL, ALP 093, normal WBC. She continues to have lower abdominal pain, but reports frequency of  bowel movements have lessened and is having minimal blood per rectum. CT abd/pelvis 06/10/21 showed acute colitis of the descending and sigmoid colon.    Her last colonoscopy was in 2021 which showed scarring of the splenic flexure and proximal descending colon consisting with healed ischemic colitis. Her IBS-D is managed on Bentyl (previous was on Viberzi).    Past Medical History:   Diagnosis Date    Chronic kidney disease     stage 3    COVID-19 vaccine series completed 05/19/2019    Pfeizer    COVID-19 virus antibody detected 01/2019    Fibromyalgia     Palate mass      Past Surgical History:   Procedure Laterality Date    APPENDECTOMY      CESAREAN SECTION      HERNIA REPAIR      left    OTHER SURGICAL HISTORY      jaw surgery    OVARIAN CYST REMOVAL      SEPTOPLASTY      TUBAL LIGATION        Family History   Problem Relation Age of Onset    Heart Disease Father     Breast Cancer Mother     Depression Mother     Kidney Disease Mother      Social History     Tobacco Use    Smoking status: Never    Smokeless tobacco: Never   Substance Use Topics    Alcohol use: No  Current Facility-Administered Medications   Medication Dose Route Frequency Provider Last Rate Last Admin    topiramate (TOPAMAX) tablet 300 mg  300 mg Oral Daily Irving Burton, MD   300 mg at 06/11/21 1610    dicyclomine (BENTYL) capsule 20 mg  20 mg Oral TID Irving Burton, MD   20 mg at 06/11/21 9604    buPROPion (WELLBUTRIN XL) extended release tablet 300 mg  300 mg Oral Daily Irving Burton, MD   300 mg at 06/11/21 5409    sodium chloride flush 0.9 % injection 10 mL  10 mL IntraVENous 2 times per day Irving Burton, MD   10 mL at 06/11/21 0813    sodium chloride flush 0.9 % injection 10 mL  10 mL IntraVENous PRN Irving Burton, MD        0.9 % sodium chloride infusion   IntraVENous PRN Irving Burton, MD        ondansetron (ZOFRAN-ODT) disintegrating tablet 4 mg  4 mg Oral Q8H PRN Irving Burton, MD        Or     ondansetron (ZOFRAN) injection 4 mg  4 mg IntraVENous Q6H PRN Irving Burton, MD   4 mg at 06/11/21 0052    polyethylene glycol (GLYCOLAX) packet 17 g  17 g Oral Daily PRN Irving Burton, MD        acetaminophen (TYLENOL) tablet 650 mg  650 mg Oral Q6H PRN Irving Burton, MD        0.9 % sodium chloride infusion   IntraVENous Continuous Alvy Bimler, MD 75 mL/hr at 06/11/21 0053 New Bag at 06/11/21 0053    morphine (PF) injection 2 mg  2 mg IntraVENous Q4H PRN Irving Burton, MD   2 mg at 06/11/21 0056    traZODone (DESYREL) tablet 100 mg  100 mg Oral Nightly Alvy Bimler, MD        pregabalin (LYRICA) capsule 75 mg  75 mg Oral BID Alvy Bimler, MD   75 mg at 06/11/21 1054    ipratropium-albuterol (DUONEB) nebulizer solution 1 ampule  1 ampule Inhalation Q4H PRN Alvy Bimler, MD        simethicone [facility]) chewable tablet 80 mg  80 mg Oral Q6H PRN Alvy Bimler, MD        benzonatate (TESSALON) capsule 100 mg  100 mg Oral TID PRN Alvy Bimler, MD        diphenhydrAMINE (BENADRYL) capsule 25 mg  25 mg Oral Q6H PRN Alvy Bimler, MD        melatonin tablet 3 mg  3 mg Oral Nightly PRN Alvy Bimler, MD        ciprofloxacin (CIPRO) IVPB 400 mg  400 mg IntraVENous Q12H Elenor Legato, PA        metronidazole (FLAGYL) 500 mg in 0.9% NaCl 100 mL IVPB premix  500 mg IntraVENous Q8H Elenor Legato, PA            Allergies   Allergen Reactions    Amoxicillin-Pot Clavulanate Diarrhea    Meperidine Hives and Nausea And Vomiting       Review of Systems:  A 12 point review of systems were obtained with pertinent positives noted in above HPI, otherwise negative     Objective:     Patient Vitals for the past 24 hrs:   Temp Pulse Resp BP SpO2   06/11/21 1135 99 F (37.2 C) 76 17 (!) 95/50 99 %   06/11/21 0740  98.8 F (37.1 C) 83 17 (!) 98/56 100 %   06/11/21 0408 98.1 F (36.7 C) 62 16 (!) 87/57 98 %   06/11/21 0126 -- -- 18 -- --   06/11/21 0056 -- -- 18 -- --   06/11/21 0017 -- 71 -- (!) 112/51 --   06/11/21 0015  98.8 F (37.1 C) 69 16 99/62 97 %   06/10/21 2130 -- 95 18 103/60 95 %   06/10/21 2105 -- -- -- (!) 101/54 98 %   06/10/21 2101 98.7 F (37.1 C) 78 18 (!) 93/57 98 %   06/10/21 1830 99.3 F (37.4 C) (!) 104 20 108/80 96 %       No intake or output data in the 24 hours ending 06/11/21 1145    Physical Exam:  GENERAL: alert, pleasant, cooperative, no distress, appears stated age  EYE: conjunctivae/corneas clear, No scleral icterus noted   LUNG: clear to auscultation bilaterally  HEART: regular rate and rhythm, S1, S2   ABDOMEN: soft, tender in the lower abdomen, bowel sounds normal. No palpable masses hernias or organomegaly appreciated   EXTREMITIES:  extremities normal, atraumatic, no cyanosis or edema   SKIN: no rash or abnormalities  NEUROLOGIC: AOx3. Reflexes and motor strength grossly normal and symmetric. Cranial nerves 2-12 and sensation grossly intact    Diagnostic Imaging:  -Colonoscopy per Dr. Leonard Downing in 2021 which showed scarring of the splenic flexure and proximal descending colon consisting with healed ischemic colitis.   -CT abd/pelvis 06/10/21 showed acute colitis of the descending and sigmoid colon.  Laboratory Results:    Labs: Results:   Chemistry Recent Labs     06/10/21  2018 06/11/21  1021   NA 143 144  143   K 3.6 3.6  3.5   CL 112* 114*  114*   CO2 23 20  20    BUN 10 7*  8*   TP 6.8  --     Estimated Creatinine Clearance: 46 mL/min (A) (based on SCr of 1.1 mg/dL (H)).   CBC w/Diff Recent Labs     06/10/21  2018 06/11/21  0600   WBC 8.1 9.3   RBC 4.40 3.68   HGB 13.4 11.1*   HCT 42.6 36.2*   PLT 282 239      Cardiac Enzymes No results for input(s): CPK, MYO in the last 72 hours.    Invalid input(s): CKRMB, CKND1, TROIP   Coagulation Recent Labs     06/10/21  2018   INR 1.1   APTT 32.3       Hepatitis Panel @LASTHBV @  Recent Labs     06/10/21  2018   ALT 19   TP 6.8         Amylase Lipase    Liver Enzymes Recent Labs     06/10/21  2018   TP 6.8   ALT 19      Thyroid Studies No results for  input(s): T4, TSH in the last 72 hours.    Invalid input(s): T3U, T3RU     Pathology pathology       Signed By: 2019 PA-C    Jun 11, 2021     Office: [phone]

## 2021-06-11 NOTE — Care Coordination-Inpatient (Signed)
Discharge plan: Home with family assistance. Spouse or son will transport home.        06/11/21 1158   Service Assessment   Patient Orientation Alert and Oriented   Cognition Alert   History Provided By Patient   Primary Caregiver Self   Support Systems Spouse/Significant Other   Patient's Healthcare Decision Maker is: Patient Declined Doctor, hospital Next of Kin Remains as Decision Maker)   PCP Verified by CM Yes  Queen Blossom PA Sawin March 2023)   Last Visit to PCP Within last 3 months   Prior Functional Level Independent in ADLs/IADLs   Current Functional Level Independent in ADLs/IADLs   Can patient return to prior living arrangement Yes   Ability to make needs known: Good   Family able to assist with home care needs: Yes   Would you like for me to discuss the discharge plan with any other family members/significant others, and if so, who? No   CM/SW Referral Disease Management Education   Social/Functional History   Lives With Spouse   Type of Home House   Home Layout Two level   Home Access Stairs to enter with rails   Entrance Stairs - Number of Steps 1   Entrance Stairs - Rails None   Bathroom Shower/Tub Walk-in shower;Tub/Shower unit   Home Equipment [employer] Help From Family   ADL Assistance Independent   Homemaking Assistance Independent   Homemaking Responsibilities No   Ambulation Assistance Independent   Copy Yes   Mode of Engineer, water   Occupation On disability   Discharge Planning   Type of Residence House   Living Arrangements Spouse/Significant Other   Current Services Prior To Admission None   Potential Assistance Needed N/A   DME Ordered? No   Potential Assistance Purchasing Medications No   Type of Home Care Services None   Patient expects to be discharged to: House   One/Two Story Residence One story   History of falls? 0   Services At/After Discharge   Transition of Care Consult (CM Consult) N/A   Services At/After Discharge None   [employer] Information Provided? No   Mode of Transport at Discharge BLS  (Spouse or son will transport home)   Confirm Follow Up Transport Family   Condition of Participation: Discharge Planning   The Plan for Transition of Care is related to the following treatment goals: feel better and return home with family   The Patient and/or Patient Representative was provided with a Choice of Provider? Patient   The Patient and/Or Patient Representative agree with the Discharge Plan? Yes   Freedom of Choice list was provided with basic dialogue that supports the patient's individualized plan of care/goals, treatment preferences, and shares the quality data associated with the providers?  Yes

## 2021-06-11 NOTE — Progress Notes (Signed)
Pager message sent to MD: [phone] [place] pt requesting her nighttime trazodone home dose 100 mg Eber Hong [phone]

## 2021-06-12 LAB — CBC WITH AUTO DIFFERENTIAL
Basophils: 0.5 % (ref 0–3)
Eosinophils: 2.8 % (ref 0–5)
Hematocrit: 35.6 % — ABNORMAL LOW (ref 37.0–50.0)
Hemoglobin: 11.1 gm/dl — ABNORMAL LOW (ref 13.0–17.2)
Immature Granulocytes: 0.3 % (ref 0.0–3.0)
Lymphocytes: 27.2 % — ABNORMAL LOW (ref 28–48)
MCH: 30.5 pg (ref 25.4–34.6)
MCHC: 31.2 gm/dl (ref 30.0–36.0)
MCV: 97.8 fL (ref 80.0–98.0)
MPV: 10.6 fL — ABNORMAL HIGH (ref 6.0–10.0)
Monocytes: 11.1 % (ref 1–13)
Neutrophils Segmented: 58.1 % (ref 34–64)
Nucleated RBCs: 0 (ref 0–0)
Platelets: 228 10*3/uL (ref 140–450)
RBC: 3.64 M/uL (ref 3.60–5.20)
RDW: 51.1 — ABNORMAL HIGH (ref 36.4–46.3)
WBC: 7.9 10*3/uL (ref 4.0–11.0)

## 2021-06-12 LAB — RENAL FUNCTION PANEL
Albumin: 2.7 gm/dl — ABNORMAL LOW (ref 3.4–5.0)
Anion Gap: 9 mmol/L (ref 5–15)
BUN: 5 mg/dl — ABNORMAL LOW (ref 9–23)
CO2: 19 mEq/L — ABNORMAL LOW (ref 20–31)
Calcium: 8.4 mg/dl — ABNORMAL LOW (ref 8.7–10.4)
Chloride: 115 mEq/L — ABNORMAL HIGH (ref 98–107)
Creatinine: 1.02 mg/dl (ref 0.55–1.02)
GFR African American: >60
GFR Non-African American: 60
Glucose: 89 mg/dl (ref 74–106)
Phosphorus: 3.3 mg/dL (ref 2.4–5.1)
Potassium: 3.3 mEq/L — ABNORMAL LOW (ref 3.5–5.1)
Sodium: 143 mEq/L (ref 136–145)

## 2021-06-12 LAB — MAGNESIUM: Magnesium: 1.9 mg/dL (ref 1.6–2.6)

## 2021-06-12 LAB — C. DIFFICILE TOXIN MOLECULAR: Clostridium difficile toxin ge: NEGATIVE

## 2021-06-12 LAB — POCT GLUCOSE: POC Glucose: 107 mg/dL — ABNORMAL HIGH (ref 65–105)

## 2021-06-12 MED ORDER — SODIUM CHLORIDE 0.9 % IV BOLUS
0.9 % | Freq: Once | INTRAVENOUS | Status: AC
Start: 2021-06-12 — End: 2021-06-12
  Administered 2021-06-12: 20:00:00 500 mL via INTRAVENOUS

## 2021-06-12 MED ORDER — SODIUM CHLORIDE 0.9 % IV SOLN
0.9 % | INTRAVENOUS | Status: AC
Start: 2021-06-12 — End: 2021-06-13
  Administered 2021-06-12 – 2021-06-13 (×3): via INTRAVENOUS

## 2021-06-12 MED ORDER — MIDODRINE HCL 5 MG PO TABS
5 MG | Freq: Three times a day (TID) | ORAL | Status: AC
Start: 2021-06-12 — End: 2021-06-13
  Administered 2021-06-12 – 2021-06-13 (×4): 5 mg via ORAL

## 2021-06-12 MED FILL — SODIUM CHLORIDE FLUSH 0.9 % IV SOLN: 0.9 % | INTRAVENOUS | Qty: 10

## 2021-06-12 MED FILL — SODIUM CHLORIDE 0.9 % IV SOLN: 0.9 % | INTRAVENOUS | Qty: 500

## 2021-06-12 MED FILL — METRONIDAZOLE 500 MG/100ML IV SOLN: 500 MG/100ML | INTRAVENOUS | Qty: 100

## 2021-06-12 MED FILL — DICYCLOMINE HCL 10 MG PO CAPS: 10 MG | ORAL | Qty: 2

## 2021-06-12 MED FILL — MIDODRINE HCL 5 MG PO TABS: 5 MG | ORAL | Qty: 1

## 2021-06-12 MED FILL — BUPROPION HCL ER (XL) 150 MG PO TB24: 150 MG | ORAL | Qty: 2

## 2021-06-12 MED FILL — PREGABALIN 75 MG PO CAPS: 75 MG | ORAL | Qty: 1

## 2021-06-12 MED FILL — CIPROFLOXACIN IN D5W 400 MG/200ML IV SOLN: 400 MG/200ML | INTRAVENOUS | Qty: 200

## 2021-06-12 MED FILL — TOPIRAMATE 100 MG PO TABS: 100 MG | ORAL | Qty: 3

## 2021-06-12 MED FILL — ACETAMINOPHEN 325 MG PO TABS: 325 MG | ORAL | Qty: 2

## 2021-06-12 MED FILL — SODIUM CHLORIDE 0.9 % IV SOLN: 0.9 % | INTRAVENOUS | Qty: 1000

## 2021-06-12 MED FILL — MORPHINE SULFATE 2 MG/ML IJ SOLN: 2 mg/mL | INTRAMUSCULAR | Qty: 1

## 2021-06-12 MED FILL — TRAZODONE HCL 100 MG PO TABS: 100 MG | ORAL | Qty: 1

## 2021-06-12 NOTE — Progress Notes (Signed)
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 Difficile, giardia and cryptosporidium negative   -Colonoscopy per Dr. Leonard Downing in 2021 which showed scarring of the splenic flexure and proximal descending colon consisting with healed ischemic colitis.               -CT abd/pelvis 06/10/21 showed acute colitis of the descending and sigmoid colon  --Normocytic Anemia likely secondary to above-Hgb 11.1gm/dl. No active bleeding   --Celiac disease maintained on Gluten Free Diet  --IBS-D-controlled on Bentyl per Pt     PLAN:     --Monitor H&H. Transfuse as needed   --Monitor for active bleeding   --Remaining Stools studies results pending   --Continue and Complete Antibiotic regimen   --Advance to full liquids as tolerated today and Soft low Residue diet in am if continued improvement     SUBJECTIVE:  Pt reports some improvement in her abdominal pain and denies recurrent rectal bleeding     OBJECTIVE:  Patient Vitals for the past 24 hrs:   Temp Pulse Resp BP SpO2   06/12/21 0805 98.4 F (36.9 C) 63 17 (!) 90/56 99 %   06/12/21 0412 96.8 F (36 C) 71 18 (!) 95/59 97 %   06/12/21 0406 97.5 F (36.4 C) 69 18 (!) 80/44 96 %   06/12/21 0404 97.7 F (36.5 C) 70 18 (!) 82/37 95 %   06/12/21 0342 -- -- 18 -- --   06/11/21 2328 -- 73 -- (!) 84/47 94 %   06/11/21 2327 99 F (37.2 C) 68 20 (!) 84/52 92 %   06/11/21 1821 96.8 F (36 C) 70 18 98/60 97 %   06/11/21 1519 99 F (37.2 C) 84 17 90/66 96 %   06/11/21 1135 99 F (37.2 C) 76 17 (!) 95/50 99 %     Physical Exam:  GENERAL:  no distress, appears stated age  ABDOMEN: soft, tender, bowel sounds active     Labs: Results:   Chemistry Recent Labs     06/10/21  2018 06/11/21  1021 06/12/21  0508   NA 143 144  143 143   K 3.6 3.6  3.5 3.3*   CL 112* 114*  114* 115*   CO2 23 20  20  19*   BUN 10 7*  8* 5*   TP 6.8  --   --     Estimated Creatinine Clearance: 49 mL/min (based on SCr of 1.02 mg/dL).   CBC w/Diff Recent Labs     06/10/21  2018 06/11/21  0600 06/11/21  1634 06/12/21  0508   WBC 8.1 9.3  --  7.9   RBC 4.40 3.68  --  3.64   HGB 13.4 11.1* 11.2* 11.1*   HCT 42.6 36.2* 36.0* 35.6*   PLT 282 239  --  228      Cardiac Enzymes No results for input(s): CPK, MYO in the last 72 hours.    Invalid input(s): CKRMB, CKND1, TROIP  Coagulation Recent Labs     06/10/21  2018   INR 1.1   APTT 32.3       Hepatitis Panel @LASTHBV @  Recent Labs     06/10/21  2018   ALT 19   TP 6.8         Amylase Lipase    Liver Enzymes Recent Labs     06/10/21  2018   TP 6.8   ALT 19      Thyroid Studies No results for input(s): T4, TSH in the last 72 hours.    Invalid input(s): T3U, T3RU     Pathology pathology       Allergies   Allergen Reactions    Amoxicillin-Pot Clavulanate Diarrhea    Meperidine Hives and Nausea And Vomiting       Current Facility-Administered Medications   Medication Dose Route Frequency    topiramate (TOPAMAX) tablet 300 mg  300 mg Oral Daily    dicyclomine (BENTYL) capsule 20 mg  20 mg Oral TID    buPROPion (WELLBUTRIN XL) extended release tablet 300 mg  300 mg Oral Daily    sodium chloride flush 0.9 % injection 10 mL  10 mL IntraVENous 2 times per day    sodium chloride flush 0.9 % injection 10 mL  10 mL IntraVENous PRN    0.9 % sodium chloride infusion   IntraVENous PRN    ondansetron (ZOFRAN-ODT) disintegrating tablet 4 mg  4 mg Oral Q8H PRN    Or    ondansetron (ZOFRAN) injection 4 mg  4 mg IntraVENous Q6H PRN    polyethylene glycol (GLYCOLAX) packet 17 g  17 g Oral Daily PRN    acetaminophen (TYLENOL) tablet 650 mg  650 mg Oral Q6H  PRN    morphine (PF) injection 2 mg  2 mg IntraVENous Q4H PRN    traZODone (DESYREL) tablet 100 mg  100 mg Oral Nightly    pregabalin (LYRICA) capsule 75 mg  75 mg Oral BID    ipratropium-albuterol (DUONEB) nebulizer solution 1 ampule  1 ampule Inhalation Q4H PRN    simethicone (MYLICON) chewable tablet 80 mg  80 mg Oral Q6H PRN    benzonatate (TESSALON) capsule 100 mg  100 mg Oral TID PRN    diphenhydrAMINE (BENADRYL) capsule 25 mg  25 mg Oral Q6H PRN    melatonin tablet 3 mg  3 mg Oral Nightly PRN    ciprofloxacin (CIPRO) IVPB 400 mg  400 mg IntraVENous Q12H    metronidazole (FLAGYL) 500 mg in 0.9% NaCl 100 mL IVPB premix  500 mg IntraVENous Q8H       Principal Problem:    Acute colitis  Resolved Problems:    * No resolved hospital problems. *      Tomeca Helm 08/10/21, FNP-C  Office: [phone]  Jun 12, 2021

## 2021-06-12 NOTE — Progress Notes (Signed)
Hospitalist Progress Note  [facility]    Daily Progress Note: 06/12/2021    Assessment/Plan:     Acute descending and sigmoid colitis  GI is following  Continue ABX Cipro and Flagyl IV  GI noted patient with prior history of ischemic colitis  PVL mesenteric arteries were unremarkable 5/8  Diet advanced from CLD-->FLD (5/9)  Monitor for tolerance of the new diet    Chronic anemia  Monitor Hb   Transfuse for Hb less than 7    Fibromyalgia  Continue Lyrica 75 mg twice daily    Anxiety and depression  Patient is on Wellbutrin XL 300 mg daily and trazodone 100 mg at bedtime    No CKD  Cr and GFR are normal    DVT Prophylaxis  SCDs    Code status. Full Code    Disposition  Pt seems to be doing ok, await further plans per GI  Home when ok with GI    Patient contact: Husband- Ieesha Abbasi [phone]    ------------------     Chart reviewed including notes, labs, imaging, studies     D/w floor RN regarding current POC and pt's condition     D/w pt and family regarding pt's condition and current POC    ------------------  Physical exam:  General: Alert, NAD, pleasant.  Cooperative  HEENT: Sclera anicteric, PERRL, OM moist, throat clear.  Chest: no wheeze, no rales, no rhonchi.  Fair air movement.  CV: RRR, S1/S2 were normal, no murmur  Abdomen: Slightly tender low mid abdomen, ND, soft, NABS, no masses were noted.  No rebound, no guarding.  Extremities: No edema.    Subjective:     Patient says she feels okay.  A bit better than she was in admission.  No fever chills, no diarrhea.  Discussed GI advancing diet and that she could go home when OK with GI    Objective:     BP (!) 95/59   Pulse 69   Temp 97.5 F (36.4 C) (Oral)   Resp 19   Ht  (1.499 m)   Wt 121 lb 0.5 oz (54.9 kg)   SpO2 97%   BMI 24.45 kg/m         Temp (24hrs), Avg:98 F (36.7 C), Min:96.8 F (36 C), Max:99 F (37.2 C)    05/09 0701 - 05/09 1900  In: -   Out: 750 [Urine:750]   05/07 1901 - 05/09 0700  In: 360 [P.O.:360]  Out: -        Data Review:       Recent Days:  Recent Labs     06/10/21  2018 06/11/21  0600 06/11/21  1634 06/12/21  0508   WBC 8.1 9.3  --  7.9   HGB 13.4 11.1* 11.2* 11.1*   HCT 42.6 36.2* 36.0* 35.6*   PLT 282 239  --  228       Recent Labs     06/10/21  2018 06/11/21  1021 06/12/21  0508   NA 143 144  143 143   K 3.6 3.6  3.5 3.3*   CL 112* 114*  114* 115*   CO2 19*   BUN 10 7*  8* 5*   MG  --   --  1.9   PHOS  --  2.4 3.3   ALT 19  --   --    INR 1.1  --   --  No results for input(s): PH, PCO2, PO2, HCO3, FIO2 in the last 72 hours.    24 Hour Results:  Recent Results (from the past 24 hour(s))   CBC with Auto Differential    Collection Time: 06/12/21  5:08 AM   Result Value Ref Range    WBC 7.9 4.0 - 11.0 1000/mm3    RBC 3.64 3.60 - 5.20 M/uL    Hemoglobin 11.1 (L) 13.0 - 17.2 gm/dl    Hematocrit 16.1 (L) 37.0 - 50.0 %    MCV 97.8 80.0 - 98.0 fL    MCH 30.5 25.4 - 34.6 pg    MCHC 31.2 30.0 - 36.0 gm/dl    Platelets 096 045 - 450 1000/mm3    MPV 10.6 (H) 6.0 - 10.0 fL    RDW 51.1 (H) 36.4 - 46.3      Nucleated RBCs 0 0 - 0      Immature Granulocytes 0.3 0.0 - 3.0 %    Neutrophils Segmented 58.1 34 - 64 %    Lymphocytes 27.2 (L) 28 - 48 %    Monocytes 11.1 1 - 13 %    Eosinophils 2.8 0 - 5 %    Basophils 0.5 0 - 3 %   Magnesium    Collection Time: 06/12/21  5:08 AM   Result Value Ref Range    Magnesium 1.9 1.6 - 2.6 mg/dL   Renal Function Panel    Collection Time: 06/12/21  5:08 AM   Result Value Ref Range    Potassium 3.3 (L) 3.5 - 5.1 mEq/L    Chloride 115 (H) 98 - 107 mEq/L    Sodium 143 136 - 145 mEq/L    CO2 19 (L) 20 - 31 mEq/L    Glucose 89 74 - 106 mg/dl    BUN 5 (L) 9 - 23 mg/dl    Creatinine 4.09 8.11 - 1.02 mg/dl    GFR African American >60.0      GFR Non-African American 60      Calcium 8.4 (L) 8.7 - 10.4 mg/dl    Anion Gap 9 5 - 15 mmol/L    Albumin 2.7 (L) 3.4 - 5.0 gm/dl    Phosphorus 3.3 2.4 - 5.1 mg/dL       CT ABDOMEN PELVIS W IV CONTRAST Additional Contrast? None    Result Date:  06/10/2021  EXAMINATION: CT ABDOMEN PELVIS W IV CONTRAST INDICATION:  evaluate colitis - diarrhea, rectal bleed, abdominal pain, fever; hx appendectomy COMPARISON: 05/26/2019 WORKSTATION ID: [id number] TECHNIQUE: Multiplanar CT of the abdomen and pelvis was performed with IV contrast. All CT exams at this facility use one or more dose reduction techniques including automatic exposure control, mA/kV adjustment per patient's size, or iterative reconstruction technique. FINDINGS: LOWER THORAX: Unremarkable LIVER: Normal. GALLBLADDER: Normal. BILIARY: Normal. PANCREAS: Unremarkable. SPLEEN: Normal. ADRENALS: No adrenal mass or nodule. RENAL/BLADDER: Normal. REPRODUCTIVE: IUD in the uterine fundus. GI TRACT: Long segments are questionable mural thickening and pericolonic inflammatory stranding of the descending and sigmoid colon compatible with acute colitis. Small bowel unremarkable. LYMPH NODES: No lymphadenopathy. PERITONEUM: Unremarkable. RETROPERITONEUM: Unremarkable. VASCULATURE: Unremarkable. ABDOMINAL WALL: Unremarkable. BONES: No acute osseous abnormality.     IMPRESSION: Acute colitis of the descending and sigmoid colon. Electronically signed by: Albin Felling, MD 06/10/2021 10:20 PM EDT     Vascular duplex abdominal visceral arteries/veins/organs complete    Result Date: 06/11/2021  [id number]  Study ID: [id number]                                        [facility]                                           [address] [place],                                         [place]                                          [address]                          Mesenteric Duplex Report Name: WREATHA, JONG Date: 06/11/2021  12:27 PM MRN: [id number]                   Patient Location: [id number] DOB: November 06, 1970               Age: 51 yrs Gender: Female                Account #: [id number] Ordering Physician: Elenor Legato Performed By: Janee Morn, RVT Interpretation Summary Patent celiac, superior mesenteric and inferior mesenteric arteries without evidence of hemodyanmically significant stenosis. ___________________________________________________________________________ Procedure A duplex exam of the mesenteric arteries was performed to determine the presence or absence of mesenteric artery occlusive disease. History/Symptoms Diarrhea. Celiac Artery The velocity of the Celiac Artery is 167 cm/s. The proximal celiac artery, splenic artery and hepatic artery were not well visualized. Proximal Superior Mesenteric Artery The velocity of the proximal Superior Mesenteric Artery is 143 cm/sec. There is no evidence of stenosis involving the proximal mesenteric artery. Mid Superior Mesenteric Artery The velocity of the mid Superior Mesenteric Artery is 142 cm/sec. There is no evidence of stenosis involving  the mid mesenteric artery. Distal Superior Mesenteric Artery The velocity of the distal Superior Mesenteric Artery is 107 cm/sec. There is no evidence of stenosis involving the distal mesenteric artery. Proximal Inferior Mesenteric Artery The velocity of the proximal Inferior Mesenteric Artery is 168 cm/sec. Proximal Abdominal Aorta The proximal abdominal aortic velocity is 135 cm/s. The proximal abdominal aortic diameter is 1.64 cm. Sonographer comments When compared to previous study of 02/20/2016, there has been no interval progression. Electronically signed byDR Billie Ruddy, MD   06/11/2021 05:01 PM       Microbiology:  @MICRORESULTS @     Cardiology:  @LASTCARDIOLOGY @      Problem List:      Medications reviewed  Current Facility-Administered Medications   Medication Dose Route Frequency    midodrine (PROAMATINE) tablet 5  mg  5 mg Oral TID WC    0.9 % sodium chloride infusion   IntraVENous Continuous    topiramate (TOPAMAX) tablet 300 mg  300 mg Oral Daily    dicyclomine (BENTYL) capsule 20 mg  20 mg Oral TID    buPROPion (WELLBUTRIN XL) extended release tablet 300 mg  300 mg Oral Daily    sodium chloride flush 0.9 % injection 10 mL  10 mL IntraVENous 2 times per day    sodium chloride flush 0.9 % injection 10 mL  10 mL IntraVENous PRN    0.9 % sodium chloride infusion   IntraVENous PRN    ondansetron (ZOFRAN-ODT) disintegrating tablet 4 mg  4 mg Oral Q8H PRN    Or    ondansetron (ZOFRAN) injection 4 mg  4 mg IntraVENous Q6H PRN    polyethylene glycol (GLYCOLAX) packet 17 g  17 g Oral Daily PRN    acetaminophen (TYLENOL) tablet 650 mg  650 mg Oral Q6H PRN    morphine (PF) injection 2 mg  2 mg IntraVENous Q4H PRN    traZODone (DESYREL) tablet 100 mg  100 mg Oral Nightly    pregabalin (LYRICA) capsule 75 mg  75 mg Oral BID    ipratropium-albuterol (DUONEB) nebulizer solution 1 ampule  1 ampule Inhalation Q4H PRN    simethicone (MYLICON) chewable tablet 80 mg  80 mg Oral Q6H PRN    benzonatate (TESSALON) capsule 100 mg  100 mg Oral TID PRN    diphenhydrAMINE (BENADRYL) capsule 25 mg  25 mg Oral Q6H PRN    melatonin tablet 3 mg  3 mg Oral Nightly PRN    ciprofloxacin (CIPRO) IVPB 400 mg  400 mg IntraVENous Q12H    metronidazole (FLAGYL) 500 mg in 0.9% NaCl 100 mL IVPB premix  500 mg IntraVENous Q8H         Lihanna Biever , MD

## 2021-06-12 NOTE — Plan of Care (Signed)
Problem: Safety - Adult  Goal: Free from fall injury  Outcome: Progressing

## 2021-06-12 NOTE — Progress Notes (Signed)
Bedside shift change report given to Cyndi Bender, RN   (oncoming nurse) by Jana Half, RN (offgoing nurse). Report included the following information Nurse Handoff Report, Intake/Output, MAR, and Recent Results.

## 2021-06-13 LAB — CBC WITH AUTO DIFFERENTIAL
Basophils: 0.5 % (ref 0–3)
Eosinophils: 4.2 % (ref 0–5)
Hematocrit: 34 % — ABNORMAL LOW (ref 37.0–50.0)
Hemoglobin: 10.3 gm/dl — ABNORMAL LOW (ref 13.0–17.2)
Immature Granulocytes: 0.3 % (ref 0.0–3.0)
Lymphocytes: 31.3 % (ref 28–48)
MCH: 30.5 pg (ref 25.4–34.6)
MCHC: 30.3 gm/dl (ref 30.0–36.0)
MCV: 100.6 fL — ABNORMAL HIGH (ref 80.0–98.0)
MPV: 11.4 fL — ABNORMAL HIGH (ref 6.0–10.0)
Monocytes: 8.3 % (ref 1–13)
Neutrophils Segmented: 55.4 % (ref 34–64)
Nucleated RBCs: 0 (ref 0–0)
Platelets: 237 10*3/uL (ref 140–450)
RBC: 3.38 M/uL — ABNORMAL LOW (ref 3.60–5.20)
RDW: 52.3 — ABNORMAL HIGH (ref 36.4–46.3)
WBC: 6.7 10*3/uL (ref 4.0–11.0)

## 2021-06-13 LAB — RENAL FUNCTION PANEL
Albumin: 2.3 gm/dl — ABNORMAL LOW (ref 3.4–5.0)
Anion Gap: 6 mmol/L (ref 5–15)
BUN: <5 mg/dl — ABNORMAL LOW (ref 9–23)
CO2: 22 mEq/L (ref 20–31)
Calcium: 8.2 mg/dl — ABNORMAL LOW (ref 8.7–10.4)
Chloride: 118 mEq/L — ABNORMAL HIGH (ref 98–107)
Creatinine: 1.04 mg/dl — ABNORMAL HIGH (ref 0.55–1.02)
GFR African American: >60
GFR Non-African American: 59
Glucose: 91 mg/dl (ref 74–106)
Phosphorus: 3.2 mg/dL (ref 2.4–5.1)
Potassium: 4.1 mEq/L (ref 3.5–5.1)
Sodium: 146 mEq/L — ABNORMAL HIGH (ref 136–145)

## 2021-06-13 LAB — MAGNESIUM: Magnesium: 1.7 mg/dL (ref 1.6–2.6)

## 2021-06-13 MED ORDER — MIDODRINE HCL 5 MG PO TABS
5 MG | Freq: Two times a day (BID) | ORAL | Status: AC
Start: 2021-06-13 — End: 2021-06-14
  Administered 2021-06-13 – 2021-06-14 (×2): 5 mg via ORAL

## 2021-06-13 MED ORDER — METRONIDAZOLE 250 MG PO TABS
250 MG | Freq: Three times a day (TID) | ORAL | Status: DC
Start: 2021-06-13 — End: 2021-06-14
  Administered 2021-06-14 (×3): 500 mg via ORAL

## 2021-06-13 MED ORDER — CIPROFLOXACIN HCL 500 MG PO TABS
500 MG | Freq: Two times a day (BID) | ORAL | Status: DC
Start: 2021-06-13 — End: 2021-06-14
  Administered 2021-06-14 (×2): 500 mg via ORAL

## 2021-06-13 MED FILL — MIDODRINE HCL 5 MG PO TABS: 5 MG | ORAL | Qty: 1

## 2021-06-13 MED FILL — METRONIDAZOLE 500 MG/100ML IV SOLN: 500 MG/100ML | INTRAVENOUS | Qty: 100

## 2021-06-13 MED FILL — DICYCLOMINE HCL 10 MG PO CAPS: 10 MG | ORAL | Qty: 2

## 2021-06-13 MED FILL — CIPROFLOXACIN IN D5W 400 MG/200ML IV SOLN: 400 MG/200ML | INTRAVENOUS | Qty: 200

## 2021-06-13 MED FILL — PREGABALIN 75 MG PO CAPS: 75 MG | ORAL | Qty: 1

## 2021-06-13 MED FILL — TRAZODONE HCL 100 MG PO TABS: 100 MG | ORAL | Qty: 1

## 2021-06-13 MED FILL — TOPIRAMATE 100 MG PO TABS: 100 MG | ORAL | Qty: 3

## 2021-06-13 MED FILL — BUPROPION HCL ER (XL) 150 MG PO TB24: 150 MG | ORAL | Qty: 2

## 2021-06-13 NOTE — Progress Notes (Signed)
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 Difficile, giardia and cryptosporidium negative   -Colonoscopy per Dr. Leonard Downing in 2021 which showed scarring of the splenic flexure and proximal descending colon consisting with healed ischemic colitis.               -CT abd/pelvis 06/10/21 showed acute colitis of the descending and sigmoid colon  --Normocytic Anemia likely secondary to above-Hgb 10.3gm/dl. No active bleeding   --Celiac disease maintained on Gluten Free Diet  --IBS-D-controlled on Bentyl per Pt     PLAN:     --Monitor H&H. Transfuse as needed   --Monitor for active bleeding   --Remaining Stools studies results pending   --Continue and Complete 5day Antibiotic regimen   --Soft low Residue, Gluten Free diet as tolerated   --Anticipate discharged within the next 24hours or so as long as the pt remains stable, diarrhea improved and no recurrent bleeding. She will need to follow up as an outpt with Dr. Leonard Downing     Will not actively follow but will be available as needed     SUBJECTIVE:  Pt reports significant improvement of her abdominal pain but continues to report loose stools with 3BMs so far today. She denies recurrent rectal bleeding     OBJECTIVE:  Patient Vitals for the past 24 hrs:   Temp Pulse Resp BP SpO2   06/13/21 0841 -- 66 -- -- --   06/13/21 0730 99 F (37.2 C) 61 20 92/60 --   06/13/21 0343 98.2 F (36.8 C) 63 16 (!) 95/55 97 %   06/12/21 2326 98.1 F (36.7 C) 71 18 (!) 87/56 95  %   06/12/21 1944 99.1 F (37.3 C) 72 18 100/63 98 %   06/12/21 1758 -- 69 -- (!) 95/59 --   06/12/21 1641 97.5 F (36.4 C) 66 19 (!) 116/57 97 %   06/12/21 1255 -- -- -- (!) 89/56 --   06/12/21 1211 99 F (37.2 C) 64 17 (!) 72/45 97 %     Physical Exam:  GENERAL: no distress, appears stated age  ABDOMEN: soft, tenderness appreciated on palpation, bowel sounds active     Labs: Results:   Chemistry Recent Labs     06/10/21  2018 06/11/21  1021 06/12/21  0508 06/13/21  0510   NA 143 144  143 143 146*   K 3.6 3.6  3.5 3.3* 4.1   CL 112* 114*  114* 115* 118*   CO2 23 20  20  19* 22   BUN 10 7*  8* 5* <5*   TP 6.8  --   --   --     Estimated Creatinine Clearance: 48 mL/min (A) (based on SCr of 1.04 mg/dL (H)).   CBC w/Diff Recent Labs     06/11/21  0600 06/11/21  1634  06/12/21  0508 06/13/21  0510   WBC 9.3  --  7.9 6.7   RBC 3.68  --  3.64 3.38*   HGB 11.1* 11.2* 11.1* 10.3*   HCT 36.2* 36.0* 35.6* 34.0*   PLT 239  --  228 237      Cardiac Enzymes No results for input(s): CPK, MYO in the last 72 hours.    Invalid input(s): CKRMB, CKND1, TROIP   Coagulation Recent Labs     06/10/21  2018   INR 1.1   APTT 32.3       Hepatitis Panel @LASTHBV @  Recent Labs     06/10/21  2018   ALT 19   TP 6.8         Amylase Lipase    Liver Enzymes Recent Labs     06/10/21  2018   TP 6.8   ALT 19      Thyroid Studies No results for input(s): T4, TSH in the last 72 hours.    Invalid input(s): T3U, T3RU     Pathology pathology       Allergies   Allergen Reactions    Amoxicillin-Pot Clavulanate Diarrhea    Meperidine Hives and Nausea And Vomiting     Current Facility-Administered Medications   Medication Dose Route Frequency    midodrine (PROAMATINE) tablet 5 mg  5 mg Oral TID WC    0.9 % sodium chloride infusion   IntraVENous Continuous    topiramate (TOPAMAX) tablet 300 mg  300 mg Oral Daily    dicyclomine (BENTYL) capsule 20 mg  20 mg Oral TID    buPROPion (WELLBUTRIN XL) extended release tablet 300 mg  300 mg Oral Daily    sodium  chloride flush 0.9 % injection 10 mL  10 mL IntraVENous PRN    0.9 % sodium chloride infusion   IntraVENous PRN    ondansetron (ZOFRAN-ODT) disintegrating tablet 4 mg  4 mg Oral Q8H PRN    Or    ondansetron (ZOFRAN) injection 4 mg  4 mg IntraVENous Q6H PRN    polyethylene glycol (GLYCOLAX) packet 17 g  17 g Oral Daily PRN    acetaminophen (TYLENOL) tablet 650 mg  650 mg Oral Q6H PRN    morphine (PF) injection 2 mg  2 mg IntraVENous Q4H PRN    traZODone (DESYREL) tablet 100 mg  100 mg Oral Nightly    pregabalin (LYRICA) capsule 75 mg  75 mg Oral BID    ipratropium-albuterol (DUONEB) nebulizer solution 1 ampule  1 ampule Inhalation Q4H PRN    simethicone (MYLICON) chewable tablet 80 mg  80 mg Oral Q6H PRN    benzonatate (TESSALON) capsule 100 mg  100 mg Oral TID PRN    diphenhydrAMINE (BENADRYL) capsule 25 mg  25 mg Oral Q6H PRN    melatonin tablet 3 mg  3 mg Oral Nightly PRN    ciprofloxacin (CIPRO) IVPB 400 mg  400 mg IntraVENous Q12H    metronidazole (FLAGYL) 500 mg in 0.9% NaCl 100 mL IVPB premix  500 mg IntraVENous Q8H     Principal Problem:    Acute colitis  Resolved Problems:    * No resolved hospital problems. *    Esmeralda Blanford 08/10/21, FNP-C  Office: [phone]  Jun 13, 2021

## 2021-06-13 NOTE — Progress Notes (Signed)
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 difficile, Cryptosporidium, Giardia on 5/8.  -Advance from full liquid to low fiber diet on 5/10.  -Abx day 2, convert Cipro and Flagyl to PO on 5/10. Consider 7 day course.    Chronic anemia  -Monitor Hb   Transfuse for Hb less than 7  -Hgb of 10.3 today. Reports diarrhea but no further dark stools.     Fibromyalgia  -Continue Lyrica 75 mg twice daily     Anxiety and depression  -Patient is on Wellbutrin XL 300 mg daily and trazodone 100 mg at bedtime     No CKD  -Baseline BUN/creatinine 5-12/1-1.1    Hypotension  -Etiology unclear, blood pressures to 70-80s at times, mostly 90s since evening of 5/9.  -Status post IV fluids, stop on 5/10.  -Currently on midodrine, will decrease to 5 mg twice daily dosing on 5/10  -GI note on 5/9 suggest chronic mild hypotension with blood pressures in the 90s and may be a partial driver for ischemic colitis suggesting further evaluation with PCP and possibly cardiology.    Celiac Disease  -gluten free diet    IBS-D  -on Bentyl  -Hesitant to add imodium at this point.  -no recent laxative use.  -Abx may relate.    PPX: holding pharm ppx with concern for GIB in setting of colitis.    Dispo:  -Tolerated regular diet for lunch so far but reports increased frequency of diarrhea with incontinence with 3 episodes over the last 24 hours.    -No worsened dark in stools, bleeding or abdominal pain reported.    -Convert to oral antibiotics, stop IV fluids, decrease  midodrine, monitor response   -hesitant to   -Follow-up any further GI recommendations   -Consider discharge tomorrow if diarrhea tolerable, and tolerating PO.    Subjective:     Patient reports no further abdominal pain and tolerated regular diet for lunch but did not have breakfast morning.  No nausea or vomiting.  She does report 3 episodes of brown diarrhea, with incontinence which is new, but no further bloody or dark-colored stool.  No current lightheadedness or dizziness.    Objective:   Physical Exam:     BP (!) 97/57   Pulse 55   Temp 98.6 F (37 C) (Temporal)   Resp 17   Ht 4\' 11"  (1.499 m)   Wt 121 lb 0.5 oz (54.9 kg)   SpO2 95%   BMI 24.45 kg/m         Temp (24hrs), Avg:98.4 F (36.9 C), Min:97.5 F (36.4 C), Max:99.1 F (37.3 C)    05/10 0701 - 05/10 1900  In: 120 [P.O.:120]  Out: -    05/08 1901 - 05/10 0700  In: 2377.5 [I.V.:977.5]  Out: 2275 [Urine:2275]    General: Alert, Thin CF, pleasant to conversation, in no acute distress  CV: Regular rate and rhythm, no murmurs, rubs, gallops  Pulm: Lungs clear to auscultation bilaterally, no focal wheezes, crackles, rhonchi  GI: Soft, nontender, no guarding, nondistended, active bowel sounds  Extremity: No clubbing, cyanosis, no pitting lower leg edema, capillary refill time intact to distal fingertips  Skin: Warm, dry      Data Review:       24 Hour Results:  Recent Results (from the past 24 hour(s))   CBC with Auto Differential    Collection Time: 06/13/21  5:10 AM   Result Value Ref Range    WBC 6.7 4.0 - 11.0 1000/mm3    RBC 3.38 (L) 3.60 - 5.20 M/uL    Hemoglobin 10.3 (L) 13.0 - 17.2 gm/dl    Hematocrit 15.1 (L) 37.0 - 50.0 %    MCV 100.6 (H) 80.0 - 98.0 fL    MCH 30.5 25.4 - 34.6 pg    MCHC 30.3 30.0 - 36.0 gm/dl    Platelets 761 607 - 450 1000/mm3    MPV 11.4 (H) 6.0 - 10.0 fL    RDW 52.3 (H) 36.4 - 46.3      Nucleated RBCs 0 0 - 0      Immature Granulocytes 0.3 0.0 - 3.0 %    Neutrophils Segmented 55.4 34 - 64 %    Lymphocytes 31.3 28 - 48  %    Monocytes 8.3 1 - 13 %    Eosinophils 4.2 0 - 5 %    Basophils 0.5 0 - 3 %   Magnesium    Collection Time: 06/13/21  5:10 AM   Result Value Ref Range    Magnesium 1.7 1.6 - 2.6 mg/dL   Renal Function Panel    Collection Time: 06/13/21  5:10 AM   Result Value Ref Range    Potassium 4.1 3.5 - 5.1 mEq/L    Chloride 118 (H) 98 - 107 mEq/L    Sodium 146 (H) 136 - 145 mEq/L    CO2 22 20 - 31 mEq/L    Glucose 91 74 - 106 mg/dl    BUN <5 (L) 9 - 23 mg/dl    Creatinine 3.71 (H) 0.55 - 1.02 mg/dl    GFR African American >60.0      GFR Non-African American 59      Calcium 8.2 (L) 8.7 - 10.4 mg/dl    Anion Gap 6 5 - 15 mmol/L    Albumin 2.3 (L) 3.4 - 5.0 gm/dl    Phosphorus 3.2 2.4 - 5.1 mg/dL           Medications reviewed  Current Facility-Administered Medications   Medication Dose Route Frequency    ciprofloxacin (CIPRO) tablet 500 mg  500 mg Oral 2 times per day    metroNIDAZOLE (FLAGYL) tablet 500 mg  500 mg Oral 3 times per day    midodrine (PROAMATINE) tablet 5 mg  5 mg Oral BID WC    topiramate (TOPAMAX) tablet 300 mg  300 mg Oral Daily    dicyclomine (BENTYL) capsule 20 mg  20 mg Oral TID    buPROPion (WELLBUTRIN XL) extended release tablet 300 mg  300 mg Oral Daily    sodium chloride flush 0.9 % injection 10 mL  10 mL IntraVENous PRN    0.9 % sodium chloride infusion   IntraVENous PRN    ondansetron (ZOFRAN-ODT) disintegrating tablet 4 mg  4 mg Oral Q8H PRN    Or    ondansetron (ZOFRAN) injection 4 mg  4 mg IntraVENous Q6H PRN    polyethylene glycol (GLYCOLAX) packet 17 g  17 g Oral Daily PRN  acetaminophen (TYLENOL) tablet 650 mg  650 mg Oral Q6H PRN    morphine (PF) injection 2 mg  2 mg IntraVENous Q4H PRN    traZODone (DESYREL) tablet 100 mg  100 mg Oral Nightly    pregabalin (LYRICA) capsule 75 mg  75 mg Oral BID    ipratropium-albuterol (DUONEB) nebulizer solution 1 ampule  1 ampule Inhalation Q4H PRN    simethicone (MYLICON) chewable tablet 80 mg  80 mg Oral Q6H PRN    benzonatate (TESSALON) capsule 100  mg  100 mg Oral TID PRN    diphenhydrAMINE (BENADRYL) capsule 25 mg  25 mg Oral Q6H PRN    melatonin tablet 3 mg  3 mg Oral Nightly PRN        Care Plan discussed with: Patient/Family and Nurse    Total time spent on patient care: 45 minutes.    Dictation software was used in the creation of this document. Unintended errors may be present.    Scarlette Ar, MD  Jun 13, 2021  2:41 PM

## 2021-06-14 LAB — CBC
Hematocrit: 32.6 % — ABNORMAL LOW (ref 37.0–50.0)
Hemoglobin: 10.3 gm/dl — ABNORMAL LOW (ref 13.0–17.2)
MCH: 30.8 pg (ref 25.4–34.6)
MCHC: 31.6 gm/dl (ref 30.0–36.0)
MCV: 97.6 fL (ref 80.0–98.0)
MPV: 10.7 fL — ABNORMAL HIGH (ref 6.0–10.0)
Platelets: 258 10*3/uL (ref 140–450)
RBC: 3.34 M/uL — ABNORMAL LOW (ref 3.60–5.20)
RDW: 50.7 — ABNORMAL HIGH (ref 36.4–46.3)
WBC: 6.7 10*3/uL (ref 4.0–11.0)

## 2021-06-14 LAB — CULTURE, STOOL

## 2021-06-14 LAB — BASIC METABOLIC PANEL
Anion Gap: 8 mmol/L (ref 5–15)
BUN: <5 mg/dl — ABNORMAL LOW (ref 9–23)
CO2: 22 mEq/L (ref 20–31)
Calcium: 8.3 mg/dl — ABNORMAL LOW (ref 8.7–10.4)
Chloride: 115 mEq/L — ABNORMAL HIGH (ref 98–107)
Creatinine: 1.02 mg/dl (ref 0.55–1.02)
GFR African American: >60
GFR Non-African American: 60
Glucose: 84 mg/dl (ref 74–106)
Potassium: 3.7 mEq/L (ref 3.5–5.1)
Sodium: 145 mEq/L (ref 136–145)

## 2021-06-14 LAB — MAGNESIUM: Magnesium: 1.8 mg/dL (ref 1.6–2.6)

## 2021-06-14 MED ORDER — METRONIDAZOLE 500 MG PO TABS
500 MG | ORAL_TABLET | Freq: Three times a day (TID) | ORAL | 0 refills | Status: AC
Start: 2021-06-14 — End: 2021-06-19

## 2021-06-14 MED ORDER — ONDANSETRON 4 MG PO TBDP
4 MG | ORAL_TABLET | Freq: Three times a day (TID) | ORAL | 0 refills | Status: AC | PRN
Start: 2021-06-14 — End: ?

## 2021-06-14 MED ORDER — MIDODRINE HCL 5 MG PO TABS
5 MG | ORAL_TABLET | Freq: Two times a day (BID) | ORAL | 0 refills | Status: AC
Start: 2021-06-14 — End: 2021-07-14

## 2021-06-14 MED ORDER — CIPROFLOXACIN HCL 500 MG PO TABS
500 MG | ORAL_TABLET | Freq: Two times a day (BID) | ORAL | 0 refills | Status: AC
Start: 2021-06-14 — End: 2021-06-18

## 2021-06-14 MED ORDER — DICYCLOMINE HCL 20 MG PO TABS
20 MG | ORAL_TABLET | Freq: Three times a day (TID) | ORAL | 0 refills | Status: AC
Start: 2021-06-14 — End: 2021-07-14

## 2021-06-14 MED FILL — ACETAMINOPHEN 325 MG PO TABS: 325 MG | ORAL | Qty: 2

## 2021-06-14 MED FILL — BUPROPION HCL ER (XL) 150 MG PO TB24: 150 MG | ORAL | Qty: 2

## 2021-06-14 MED FILL — DICYCLOMINE HCL 10 MG PO CAPS: 10 MG | ORAL | Qty: 2

## 2021-06-14 MED FILL — ONDANSETRON 4 MG PO TBDP: 4 MG | ORAL | Qty: 1

## 2021-06-14 MED FILL — PREGABALIN 75 MG PO CAPS: 75 MG | ORAL | Qty: 1

## 2021-06-14 MED FILL — METRONIDAZOLE 250 MG PO TABS: 250 MG | ORAL | Qty: 2

## 2021-06-14 MED FILL — CIPROFLOXACIN HCL 500 MG PO TABS: 500 MG | ORAL | Qty: 1

## 2021-06-14 MED FILL — TRAZODONE HCL 100 MG PO TABS: 100 MG | ORAL | Qty: 1

## 2021-06-14 MED FILL — TOPIRAMATE 100 MG PO TABS: 100 MG | ORAL | Qty: 3

## 2021-06-14 MED FILL — MIDODRINE HCL 5 MG PO TABS: 5 MG | ORAL | Qty: 1

## 2021-06-14 NOTE — Discharge Summary (Signed)
Hospitalist Discharge Summary      Discharge Summary   Admit Date: 06/10/2021  Discharge Date:  06/14/21      Patient ID:  [name] [name]  51 y.o.  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 coli O157, Aeromonas, or Campylobacter  -Negative testing for C. difficile, Cryptosporidium, Giardia on 5/8.  -Advance from full liquid to low fiber diet on 5/10. Tolerating  -Abx day 3, convert Cipro and Flagyl to PO on 5/10.  Plan to complete 7-day course at discharge.  -Follow-up with Dr. Leonard Downing in 2 to 4 weeks.  -also given Rx for zofran prn for nausea.      Chronic anemia  -Monitor Hb   Transfuse for Hb less than 7  -Hgb stable at 10.3 today.   -Consider CBC at follow up     Fibromyalgia  -Continue Lyrica 75 mg twice daily     Anxiety and depression  -Patient is on Wellbutrin XL 300 mg daily and trazodone 100 mg at bedtime     No CKD  -Baseline BUN/creatinine 5-12/1-1.1     Hypotension  -Etiology unclear, blood pressures to 70-80s at times, mostly 90s since evening of 5/9.  -Status post IV fluids, stop on 5/10.  -Currently on midodrine, will decrease to 5 mg twice daily dosing on 5/10  -GI note on 5/9 suggest chronic mild hypotension with blood pressures in the 90s and may be a partial driver for ischemic colitis suggesting further evaluation with PCP and possibly cardiology.  -Will continue Midodrine 5mg  BID at discharge but consider decreasing or stopping in outpatient setting if blood pressure remained stable     Celiac Disease  -gluten free  diet     IBS-D  -on Bentyl, refilled aft discharge per patient request.  -Hesitant to add imodium at this point.  -no recent laxative use.  -Abx may relate.  -She reports no further diarrhea on 5/11.        Current Discharge Medication List        START taking these medications    Details   ciprofloxacin (CIPRO) 500 MG tablet Take 1 tablet by mouth every 12 hours for 8 doses  Qty: 8 tablet, Refills: 0      metroNIDAZOLE (FLAGYL) 500 MG tablet Take 1 tablet by mouth every 8 hours for 13 doses  Qty: 13 tablet, Refills: 0      ondansetron (ZOFRAN-ODT) 4 MG disintegrating tablet Take 1 tablet by mouth every 8 hours as needed for Nausea or Vomiting  Qty: 20 tablet, Refills: 0      midodrine (PROAMATINE) 5 MG tablet Take 1 tablet by mouth 2 times daily (with meals)  Qty: 60 tablet, Refills: 0           CONTINUE these medications which have CHANGED    Details   dicyclomine (BENTYL) 20 MG tablet Take 1 tablet by mouth 3 times daily  Qty: 90 tablet, Refills: 0           CONTINUE  these medications which have NOT CHANGED    Details   montelukast (SINGULAIR) 10 MG tablet Take 1 tablet by mouth nightly      omeprazole (PRILOSEC) 40 MG delayed release capsule Take 1 capsule by mouth in the morning and at bedtime      pregabalin (LYRICA) 75 MG capsule Take 1 capsule by mouth 2 times daily. Max Daily Amount: 150 mg      albuterol sulfate HFA (PROVENTIL;VENTOLIN;PROAIR) 108 (90 Base) MCG/ACT inhaler Inhale 2 puffs into the lungs every 6 hours as needed      azelastine HCl 0.15 % SOLN 2 sprays 2 times daily      baclofen (LIORESAL) 10 MG tablet Take 1 tablet by mouth 3 times daily      buPROPion (WELLBUTRIN XL) 300 MG extended release tablet Take 1 tablet by mouth      fluticasone (FLONASE) 50 MCG/ACT nasal spray 2 sprays by Nasal route daily      modafinil (PROVIGIL) 200 MG tablet TAKE 1 TO 2 TABLETS BY MOUTH EVERY DAY      topiramate (TOPAMAX) 100 MG tablet Take 3 tablets by mouth      traZODone (DESYREL) 100 MG tablet Take 1  tablet by mouth               Initial presentation:  From HPI:  [name] [name] is a 51 y.o. year old female with PMH as noted above who presents with abdominal pain, and diarrhea containing blood.   Patient has a hx of ischemic colitis. Patient has had 6 bowel movement containing bright red blood, FOBT positive. Patient is not on [facility]. CT shows colitis as noted below. Lactic acid added. GI consulted and not recommending ABX at this time.     She was admitted with plans for IV fluids, empiric antibiotics, to trend lactic acid, white blood cell count watch for fever, and consult GI.    Hospital Course:   GI evaluated on 5/8 noting history of colonoscopy in 2021 with scarring of the splenic flexure and proximal descending colon consistent with healed ischemic colitis.  Felt clinical picture consistent with ischemic colitis with plans for treatment with Cipro and Flagyl, Bentyl for abdominal pain to monitor for bleeding or decrease in hemoglobin to repeat mesenteric duplex ultrasound, clear liquid diet and to monitor hemoglobin.  Diet was advanced to a full liquid diet on the ninth with plans to advance to a low residue diet the next day if improving.  On the ninth GI noted history of chronic mild hypotension with systolic blood pressures to the 90 and felt that this may be a partial drive her episodes of ischemic colitis and suggested further evaluation with PCP and possibly cardiology.  Midodrine has been added in addition to IV fluids.  When seen on the 10th the patient reported tolerating a low fiber diet for lunch but with 3 incontinent episodes of stool over the last 24 hours although no worsened dark stools or bleeding.  PVL study showed no significant stenosis in the celiac, SMA or IMA.  Stool cultures were negative as well as C. difficile testing, Cryptosporidium and Giardia antigen testing.  Plans made to stop IV fluids, convert antibiotics to oral dosing and to decrease frequency of midodrine while watching for  blood pressure remained soft in the 90s-100.  GI suggested she would be okay for discharge if tolerating p.o. with improved diarrhea and no further bleeding but would need outpatient follow-up with Dr. Perlie Gold Tiongco.  Specter that some element of diarrhea may relate to ischemic colitis versus antibiotic effect versus underlying IBS-d diarrhea predominant type.  When seen on the 11th the patient reported no further diarrhea.  She reports that she had less appetite but attributes this more to the hospital offerings.  She does report some continued abdominal pain was slightly more on the left side today but otherwise reports feeling well and would like to go home.  Plan for discharge with formal days of Cipro and Flagyl to complete a 7-day course and refill Bentyl at patient's request.  As blood pressure remains soft plan continue midodrine at twice daily dosing recommend follow-up with primary care.  She is also given prescription for as needed Zofran if needed.    Consultants:   Dr. Cleda Daub and colleagues of GI.    Imaging:  CT ABDOMEN PELVIS W IV CONTRAST Additional Contrast? None    Result Date: 06/10/2021  EXAMINATION: CT ABDOMEN PELVIS W IV CONTRAST INDICATION:  evaluate colitis - diarrhea, rectal bleed, abdominal pain, fever; hx appendectomy COMPARISON: 05/26/2019 WORKSTATION ID: [id number] TECHNIQUE: Multiplanar CT of the abdomen and pelvis was performed with IV contrast. All CT exams at this facility use one or more dose reduction techniques including automatic exposure control, mA/kV adjustment per patient's size, or iterative reconstruction technique. FINDINGS: LOWER THORAX: Unremarkable LIVER: Normal. GALLBLADDER: Normal. BILIARY: Normal. PANCREAS: Unremarkable. SPLEEN: Normal. ADRENALS: No adrenal mass or nodule. RENAL/BLADDER: Normal. REPRODUCTIVE: IUD in the uterine fundus. GI TRACT: Long segments are questionable mural thickening and pericolonic inflammatory stranding of the descending and  sigmoid colon compatible with acute colitis. Small bowel unremarkable. LYMPH NODES: No lymphadenopathy. PERITONEUM: Unremarkable. RETROPERITONEUM: Unremarkable. VASCULATURE: Unremarkable. ABDOMINAL WALL: Unremarkable. BONES: No acute osseous abnormality.     IMPRESSION: Acute colitis of the descending and sigmoid colon. Electronically signed by: Albin Felling, MD 06/10/2021 10:20 PM EDT           Vascular duplex abdominal visceral arteries/veins/organs complete    Result Date: 06/11/2021  [id number]                                                           Study ID: [id number]                                        [facility]                                           [address] [place],                                         [place]                                          [address]                          Mesenteric Duplex Report Name: LOUCINDA, CROY Date: 06/11/2021 12:27 PM MRN: [id number]                   Patient Location: [id number] DOB: 09-Oct-1970               Age: 26 yrs Gender: Female                Account #: [id number] Ordering Physician: Elenor Legato Performed By: Janee Morn, RVT Interpretation Summary Patent celiac, superior mesenteric and inferior mesenteric arteries without evidence of hemodyanmically significant stenosis. ___________________________________________________________________________ Procedure A duplex exam of the mesenteric arteries was performed to determine the presence or absence of mesenteric artery occlusive disease. History/Symptoms Diarrhea. Celiac Artery The velocity of the Celiac Artery is 167 cm/s. The proximal celiac artery, splenic artery and hepatic artery were not well visualized. Proximal Superior Mesenteric  Artery The velocity of the proximal Superior Mesenteric Artery is 143 cm/sec. There is no evidence of stenosis involving the proximal mesenteric artery. Mid Superior Mesenteric Artery The velocity of the mid Superior Mesenteric Artery is 142 cm/sec. There is no evidence of stenosis involving the mid mesenteric artery. Distal Superior Mesenteric Artery The velocity of the distal Superior Mesenteric Artery is 107 cm/sec. There is no evidence of stenosis involving the distal mesenteric artery. Proximal Inferior Mesenteric Artery The velocity of the proximal Inferior Mesenteric Artery is 168 cm/sec. Proximal Abdominal Aorta The proximal abdominal aortic velocity is 135 cm/s. The proximal abdominal aortic diameter is 1.64 cm. Sonographer comments When compared to previous study of 02/20/2016, there has been no interval progression. Electronically signed byDR Billie Ruddy, MD   06/11/2021 05:01 PM                 Physical Exam on Discharge:  BP 95/61   Pulse 59   Temp 98.2 F (36.8 C) (Temporal)   Resp 17   Ht  (1.499 m)   Wt 121 lb 0.5 oz (54.9 kg)   SpO2 93%   BMI 24.45 kg/m     General: Alert, Thin CF, pleasant to conversation, in no acute distress  CV: Regular rate and rhythm, no murmurs, rubs, gallops  Pulm: Lungs clear to auscultation bilaterally, no focal wheezes, crackles, rhonchi  GI: Soft, B LQ tenderness but  no guarding, nondistended, active bowel sounds  Extremity: No clubbing, cyanosis, no pitting lower leg edema, capillary refill time intact to distal fingertips  Skin: Warm, dry      Most Recent BMP and CBC:    Lab Results   Component Value Date/Time    NA 145 06/14/2021 02:32 AM    K 3.7 06/14/2021 02:32 AM    CL 115 06/14/2021 02:32 AM    CO2 22 06/14/2021 02:32 AM    BUN <5 06/14/2021 02:32 AM    GFRAA >60.0 06/14/2021 02:32 AM      Lab Results   Component Value Date/Time    WBC 6.7 06/14/2021 02:32 AM    HGB 10.3 06/14/2021 02:32 AM    HCT 32.6 06/14/2021 02:32 AM    PLT 258  06/14/2021 02:32 AM    MCV 97.6 06/14/2021 02:32 AM          Condition at discharge: Stable.     Disposition:  Home      PCP:  Julio Alm, DO, f/up with PCP or colleague in 5-7 days.    F/up with GI, Dr. Leonard Downing in 2-4 weeks      Total time for DC was 35 minutes.      Dictation software was used in the creation of this document. Unintended errors may be present.        Scarlette Ar, MD  Jun 14, 2021  11:40 AM

## 2021-06-14 NOTE — Progress Notes (Signed)
Discharge Plan:   Home    Discharge Date:     06/14/2021     Home Health Needed:     n    Transportation: Family

## 2021-06-14 NOTE — Plan of Care (Signed)
Problem: Pain  Goal: Verbalizes/displays adequate comfort level or baseline comfort level  Outcome: Progressing     Problem: Safety - Adult  Goal: Free from fall injury  Outcome: Progressing

## 2021-06-27 LAB — HEMOGLOBIN A1C
Estimated Avg Glucose, External: 96 mg/dL (ref 91–123)
Hemoglobin A1C, External: 5 % (ref 4.8–5.6)

## 2021-08-24 ENCOUNTER — Encounter

## 2021-09-25 ENCOUNTER — Inpatient Hospital Stay: Admit: 2021-09-25 | Payer: MEDICARE | Primary: Family Medicine

## 2021-09-25 DIAGNOSIS — J4540 Moderate persistent asthma, uncomplicated: Secondary | ICD-10-CM

## 2021-09-25 NOTE — Procedures (Signed)
[facility]  PULMONARY REPORT  NAME:  [name], [name]  SEX:   F  DATE: 09/25/2021  SS:   DOB: 1971/01/08  MR#    [id number]  ROOM:    BILLING: [id number]  REFERRING PHYSICIAN: EMILY DIDIO      cc: Mike Craze MD      DIAGNOSIS:    Asthma.     PROCEDURES PERFORMED:    1.  Spirometry with and without bronchodilator response.  2.  Lung volume measurement by body plethysmography.  3.  Single-breath carbon monoxide diffusion capacity measurement.     Technician reported good patient effort and cooperation and good performance in all maneuvers.  Spirometry data was repeatable.  DLCO was not corrected for hemoglobin.     RESULTS:    Flow volume loop had upward concavity suggestive of airway obstruction.  Total expiratory time was 9.67 seconds.     Forced vital capacity was 2.85 liters, which is 97% of predicted.  FEV1 was 2.29 liters, which is 99% of predicted.  FEV1/FVC ratio was 80%.  No significant response to bronchodilator on spirometry.     Total lung capacity was 4.04 liters, which is 92% of predicted.  Residual volume was 1.19 liters, which is 73% of predicted.     Diffusion capacity not corrected for hemoglobin was 13.7, which is 60% of predicted.  DLCO/VA was 81%.     IMPRESSION:    1.  Flow volume loop had upward concavity.  However, spirometry did not show airway obstruction.  There was no significant bronchodilator response.  2.  Lung volumes did not show restriction or hyperinflation.  3.  There was mild reduction in diffusion capacity which partially corrected for alveolar volume.      ___________________  Nanetta Batty MD   Dictated [id number] D. Celine Mans, MD  SDH  D: 09/25/2021 16:42:46  T: 09/25/2021 17:03:48  [id number]

## 2021-09-27 ENCOUNTER — Inpatient Hospital Stay: Admit: 2021-09-27 | Payer: MEDICARE | Primary: Family Medicine

## 2021-11-06 ENCOUNTER — Inpatient Hospital Stay: Admit: 2021-11-06 | Payer: MEDICARE | Primary: Family Medicine

## 2021-11-07 ENCOUNTER — Encounter

## 2021-11-19 ENCOUNTER — Inpatient Hospital Stay: Admit: 2021-11-19 | Payer: MEDICARE | Attending: Cardiovascular Disease | Primary: Family Medicine

## 2021-11-19 DIAGNOSIS — G90A Postural orthostatic tachycardia syndrome (POTS): Secondary | ICD-10-CM

## 2021-11-19 NOTE — Procedures (Unsigned)
[facility]  Tilt table  NAME:  [name] [name], [name] [name]  DATE: 11/19/2021  REFERRING PHYSICIAN: Melanie Pellot LEE [name]  DOB: Jul 23, 1970  MR#    [id number]  LOCATION: CATH  ACCT#  [id number]    cc: Harless Litten MD      TILT TABLE TEST.      The patient was tilted upright for a total of 30 minutes.  She developed symptoms of lightheadedness and feeling that her body was heavy.     The baseline blood pressure 1135/83 with a pulse of 60.  Immediately on upright tilting, her pulse went to 80, blood pressure was 131/82.  There were no arrhythmias noted.     OVERALL IMPRESSION:   Negative tilt table for neurocardiogenic syncope.  Does not meet diagnostic criteria for postural orthostatic tachycardia syndrome.  The patient did have symptoms.      ___________________  Lyndon Code MD   Dictated [id number] Jacqlyn Krauss, MD  SB  D: 11/20/2021 12:05:45  T: 11/20/2021 02:72:53  [id number]

## 2021-11-21 ENCOUNTER — Inpatient Hospital Stay: Admit: 2021-11-21 | Payer: MEDICARE | Attending: Cardiovascular Disease | Primary: Family Medicine

## 2021-11-21 DIAGNOSIS — I209 Angina pectoris, unspecified: Secondary | ICD-10-CM

## 2021-11-21 MED ORDER — REGADENOSON 0.4 MG/5ML IV SOLN
0.4 MG/5ML | Freq: Once | INTRAVENOUS | Status: AC | PRN
Start: 2021-11-21 — End: 2021-11-21
  Administered 2021-11-21: 13:00:00 0.4 mg via INTRAVENOUS

## 2021-11-21 MED ORDER — TECHNETIUM TC 99M SESTAMIBI IV KIT
Freq: Once | INTRAVENOUS | Status: AC | PRN
Start: 2021-11-21 — End: 2021-11-21
  Administered 2021-11-21: 13:00:00 32.8 via INTRAVENOUS

## 2021-11-21 MED ORDER — TECHNETIUM TC 99M SESTAMIBI IV KIT
Freq: Once | INTRAVENOUS | Status: AC | PRN
Start: 2021-11-21 — End: 2021-11-21
  Administered 2021-11-21: 12:00:00 10.9 via INTRAVENOUS

## 2021-11-21 MED FILL — REGADENOSON 0.4 MG/5ML IV SOLN: 0.4 MG/5ML | INTRAVENOUS | Qty: 5

## 2022-01-09 ENCOUNTER — Inpatient Hospital Stay: Admit: 2022-01-09 | Payer: MEDICARE | Primary: Family Medicine

## 2022-02-12 ENCOUNTER — Inpatient Hospital Stay: Admit: 2022-02-12 | Primary: Family Medicine

## 2022-05-13 ENCOUNTER — Inpatient Hospital Stay: Admit: 2022-05-13 | Primary: Family Medicine

## 2022-07-20 ENCOUNTER — Encounter

## 2022-07-29 ENCOUNTER — Inpatient Hospital Stay: Admit: 2022-07-29 | Payer: MEDICARE | Primary: Family Medicine

## 2022-07-29 DIAGNOSIS — M6281 Muscle weakness (generalized): Secondary | ICD-10-CM

## 2022-07-29 NOTE — Progress Notes (Signed)
PHYSICAL / OCCUPATIONAL THERAPY - DAILY TREATMENT NOTE (updated 1/23)  For Eval visit    Patient Name: [name] [name]    Date: 07/29/2022    DOB: January 26, 1971  Insurance: Payor: [employer] / Plan: [employer] / Product Type: *No Product type* /      Patient DOB verified yes     Visit #   Current / Total 1 10   Time   In / Out 201 241   Total Treatment  Time  40   Pain   In / Out 5 5   Subjective Functional Status/Changes: See below     NEXT PROGRESS NOTE DUE: 08/28/2022    TREATMENT AREA =  Muscle weakness (generalized) [M62.81]    SUBJECTIVE:  The patient presents with c/o general pain and weakness that progressed over the last month. Patient reports increased nausea limiting her activity tolerance and notes increased weakness. She reports some dizziness which she attributes to POTS symptoms. She does report occasional tripping, feels like her toes get caught. Patient states she underwent cortisone injection to upper thoracic spine in May 2024.          Functional Limitations:  All ADL's, difficulty performing yard duties, difficulty with stairs, pain with reaching overhead     Max pain: 8/10  Avg pain: 5/10  Min pain: 3/10    Objective Measure: QuickDASH    57%    Past Medical Hx:  Hx of fibromyalgia, POTS    Co-morbidities: asthma, depression, fibromyalgia, heart disease/high chol.      OBJECTIVE    40 min   Eval - untimed                        [x]   Patient Education billed concurrently with other procedures   [x]  Review HEP    []  Progressed/Changed HEP, detail:    []  Other detail:       Objective Information/Functional Measures/Assessment:      Fall Risk Assessment: Does the patient have a fear of falling? yes If yes, what fall risk assessment was performed?    Fall assessment:    5xSTS test: 21 seconds with pain increase     C/S screen: WNL except right rotation 50 deg and left 70 deg     Right shoulder AROM: flexion 140 deg, ABD 90 deg   Right shoulder strength: flexion 4-/5, abduction  3/5  Left shoulder AROM: flexion 160 deg, ABD 110 deg with UT compensation   Left shoulder strength: flexion 4-/5, abduction 4-/5     Right hip strength: flexion 3/5  Left hip strength: flexion 4-/5     90/90 HS length: right -70 deg prior to pain onset, left -45 deg prior to pain onset   SLR test (+) on the right        The patient presents for evaluation to address general pain and weakness. Pt reports hx of fibromyalgia, POTS, L/S pain, cervical pain, shoulder pain. Noted limited UE/LE AROM and strength upon gross assessment. Secondary to c/o neck/ right shoulder pain causing most dysfunction, patient performed QuickDASH and scored 56.8% indicating moderate disability secondary to shoulder dysfunction. Limited functional mobility noted upon performance of 5xSTS test, plan to assess static balance at F/U appts.  Developed and reviewed HEP. The patient will benefit from skilled PT to address above limitations and improve QoL.     Patient will continue to benefit from skilled PT / OT services to modify and  progress therapeutic interventions, analyze and address functional mobility deficits, analyze and address ROM deficits, analyze and address strength deficits, analyze and address soft tissue restrictions, analyze and cue for proper movement patterns, and analyze and modify for postural abnormalities to address functional deficits and attain remaining goals.    Progress toward goals / Updated goals:  [x]   See POC    Short term goals to be accomplished in 10 visits:   The pt will be IND and compliant with HEP and self management of symptoms.    Status at eval: developed and reviewed  Current:    The patient will perform 5xSTS test in 18 seconds as an indicator of improved functional mobility.    Status at eval: 21 seconds with pain increase   Current:    The patient will improve right shoulder flexion AROM to 150 deg and B/L shoulder ABD AROM to 120 deg for improved OH activity tolerance and ADL's.   Status at eval:  right shoulder flexion 140 deg, ABD 90 deg; left shoulder ABD 110 deg with UT compensation   Current:    The patient will improve B/L shoulder flexion and abduction strength to 4/5 for improved tolerance of household duties.    Status at eval: right shoulder flexion 4-/5 abduction 3/5; left shoulder flexion/abduction 4-/5  Current:    The patient will improve B/L hip  flexion strength to 4/5 for improved walking tolerance.   Status at eval: right 3/5, left 4-/5  Current:      Long term goals to be accomplished in 24 visits:   The patient will perform 5xSTS test in 14 seconds as an indicator of improved functional mobility.   Status at eval: 21 seconds with pain increase   Current:    The patient will improve right shoulder flexion AROM to 160 deg and B/L shoulder ABD AROM to 130 deg for improved OH activity tolerance and ADL's.   Status at eval: right shoulder flexion 140 deg, ABD 90 deg; left shoulder ABD 110 deg with UT compensation  Current:    The patient will improve B/L shoulder flexion and abduction strength to 5/5 for improved tolerance of household duties.    Status at eval: right shoulder flexion 4-/5 abduction 3/5; left shoulder flexion/abduction 4-/5  Current:    The patient will improve B/L hip strength globally to 5/5 for improved walking tolerance.   Status at eval: right flexion 3/5, left flexion 4-/5  Current:    The patient will improve QuickDASH  score to 38% as a functional indicator of improved mobility.    Status at eval: 57%  Current:       PLAN  yes Continue plan of care  []   Upgrade activities as tolerated  []   Discharge due to :  [x]   Other: Pt to begin treatment  2 times a week for 24 visits. Pt to be re-evaluated after the next 10 visits or 30 days which ever comes first.       Justification for Eval Code Complexity:  Patient History : Hx of fibromyalgia, POTS  Examination see exam   Clinical Presentation: Evolving with changing characteristics   Clinical Decision Making : QuickDASH: 57%       Harvel Quale, PT    07/29/2022    2:04 PM    Future Appointments   Date Time Provider Department Center   07/30/2022 11:30 AM [facility]

## 2022-07-29 NOTE — Progress Notes (Signed)
[facility]  [address], [place], [address] Phone: [phone] Fax [phone]  Plan of Care / Statement of Necessity for Physical Therapy Services     Patient Name: [name] [name] DOB: 1970/05/08   Medical   Diagnosis: Muscle weakness (generalized) [M62.81] Treatment Diagnosis: M62.81  GENERAL MUSCLE WEAKNESS     Onset Date: May 2024  Payor   Payor: [employer] MEDICARE / Plan: [employer] MEDICARE VALUE HMO / Product Type: *No Product type* /    Referral Source: Honor Junes, DO Start of Care [facility]): 07/29/2022   Prior Hospitalization: See medical history Provider #: [phone]   Prior Level of Function: IND   Comorbidities: asthma, depression, fibromyalgia, heart disease/high chol.     Assessment / key information:    SUBJECTIVE:  The patient presents with c/o general pain and weakness that progressed over the last month. Patient reports increased nausea limiting her activity tolerance and notes increased weakness. She reports some dizziness which she attributes to POTS symptoms. She does report occasional tripping, feels like her toes get caught. Patient states she underwent cortisone injection to upper thoracic spine in May 2024.           Functional Limitations:  All ADL's, difficulty performing yard duties, difficulty with stairs, pain with reaching overhead      Max pain: 8/10  Avg pain: 5/10  Min pain: 3/10     Objective Measure: QuickDASH    57%     Past Medical Hx:  Hx of fibromyalgia, POTS     Co-morbidities: asthma, depression, fibromyalgia, heart disease/high chol.    Objective Information/Functional Measures/Assessment:        Fall Risk Assessment: Does the patient have a fear of falling? yes If yes, what fall risk assessment was performed?    Fall assessment:               5xSTS test: 21 seconds with pain increase      C/S screen: WNL except right rotation 50 deg and left 70 deg      Right shoulder AROM: flexion 140 deg, ABD 90 deg    Right shoulder strength: flexion 4-/5, abduction 3/5  Left shoulder AROM: flexion 160 deg, ABD 110 deg with UT compensation   Left shoulder strength: flexion 4-/5, abduction 4-/5      Right hip strength: flexion 3/5  Left hip strength: flexion 4-/5      90/90 HS length: right -70 deg prior to pain onset, left -45 deg prior to pain onset   SLR test (+) on the right      The patient presents for evaluation to address general pain and weakness. Pt reports hx of fibromyalgia, POTS, L/S pain, cervical pain, shoulder pain. Noted limited UE/LE AROM and strength upon gross assessment. Secondary to c/o neck/ right shoulder pain causing most dysfunction, patient performed QuickDASH and scored 56.8% indicating moderate disability secondary to shoulder dysfunction. Limited functional mobility noted upon performance of 5xSTS test, plan to assess static balance at F/U appts.  Developed and reviewed HEP. The patient will benefit from skilled PT to address above limitations and improve QoL.     Evaluation Complexity:  History:  HIGH Complexity :3+ comorbidities / personal factors will impact the outcome/ POC ; Examination:  HIGH Complexity : 4+ Standardized tests and measures addressing body structure, function, activity limitation and / or participation in recreation  ;Presentation:  MEDIUM Complexity : Evolving with changing characteristics  ;  Clinical Decision Making: QuickDASH: Disability Arm, Shoulder, Hand = 57 % ; (41%- 60% Moderate Disability) = MODERATE Complexity  Overall Complexity Rating: HIGH   Problem List: pain affecting function, decrease ROM, decrease strength, impaired gait/balance, decrease ADL/functional abilities, decrease activity tolerance, and decrease flexibility/joint mobility   Treatment Plan may include any combination of the following: 51884 Therapeutic Exercise, 97112 Neuromuscular Re-Education, 97140 Manual Therapy, 97530 Therapeutic Activity, 97535 Self Care/Home Management, 97014 Electrical Stim  unattended, [phone] Electrical Stim attended, [phone] Vasopneumatic Device  (Vasopnuematic compression justification:  Per bilateral girth measures taken and listed above the edema is considered significant and having an impact on the patient's ADL's), 97116 Gait Training, 10932 Ultrasound, 97012 Mechanical Traction, and (Elective Self Pay) Needle Insertion w/o Injection (1 or 2 muscles), (3+ muscles)  Patient / Family readiness to learn indicated by: asking questions, trying to perform skills, and interest  Persons(s) to be included in education: patient (P)  Barriers to Learning/Limitations: none  Measures taken if barriers to learning present: N/A   Patient Goal (s): "strengthen and pain free"  Patient Self Reported Health Status: good  Rehabilitation Potential: good  Short term goals to be accomplished in 10 visits:   The pt will be IND and compliant with HEP and self management of symptoms.    Status at eval: developed and reviewed  Current:    The patient will perform 5xSTS test in 18 seconds as an indicator of improved functional mobility.    Status at eval: 21 seconds with pain increase   Current:    The patient will improve right shoulder flexion AROM to 150 deg and B/L shoulder ABD AROM to 120 deg for improved OH activity tolerance and ADL's.   Status at eval: right shoulder flexion 140 deg, ABD 90 deg; left shoulder ABD 110 deg with UT compensation   Current:    The patient will improve B/L shoulder flexion and abduction strength to 4/5 for improved tolerance of household duties.    Status at eval: right shoulder flexion 4-/5 abduction 3/5; left shoulder flexion/abduction 4-/5  Current:    The patient will improve B/L hip  flexion strength to 4/5 for improved walking tolerance.   Status at eval: right 3/5, left 4-/5  Current:       Long term goals to be accomplished in 24 visits:   The patient will perform 5xSTS test in 14 seconds as an indicator of improved functional mobility.   Status at eval: 21 seconds  with pain increase   Current:    The patient will improve right shoulder flexion AROM to 160 deg and B/L shoulder ABD AROM to 130 deg for improved OH activity tolerance and ADL's.   Status at eval: right shoulder flexion 140 deg, ABD 90 deg; left shoulder ABD 110 deg with UT compensation  Current:    The patient will improve B/L shoulder flexion and abduction strength to 5/5 for improved tolerance of household duties.    Status at eval: right shoulder flexion 4-/5 abduction 3/5; left shoulder flexion/abduction 4-/5  Current:    The patient will improve B/L hip strength globally to 5/5 for improved walking tolerance.   Status at eval: right flexion 3/5, left flexion 4-/5  Current:    The patient will improve QuickDASH  score to 38% as a functional indicator of improved mobility.    Status at eval: 57%  Current:       Frequency / Duration: Patient would benefit from skilled PT 2 times per week  for 24 VISITS as needed in this certification period.  Goals will be assigned and reassessed every 10 visits/ 30 days per Medicare guidelines    Patient/ Caregiver education and instruction: Diagnosis, prognosis, self care and exercises [x]   Plan of care has been reviewed with PTA    Certification Period: 07/29/2022-10/27/2022    Harvel Quale, PT       07/29/2022       3:38 PM  ===================================================================  I certify that the above Therapy Services are being furnished while the patient is under my care. I agree with the treatment plan and certify that this therapy is necessary.    Physician's Signature:_________________________   DATE:_________   TIME:________                           Honor Junes, DO    ** Signature, Date and Time must be completed for valid certification **  Please sign and return to [facility] or you may fax the signed copy to [phone].  Thank you.

## 2022-07-30 ENCOUNTER — Inpatient Hospital Stay: Admit: 2022-07-30 | Payer: MEDICARE | Primary: Family Medicine

## 2022-07-30 DIAGNOSIS — R110 Nausea: Secondary | ICD-10-CM

## 2022-08-06 ENCOUNTER — Inpatient Hospital Stay: Admit: 2022-08-06 | Payer: MEDICARE | Primary: Family Medicine

## 2022-08-06 DIAGNOSIS — M6281 Muscle weakness (generalized): Secondary | ICD-10-CM

## 2022-08-06 NOTE — Progress Notes (Signed)
PHYSICAL / OCCUPATIONAL THERAPY - DAILY TREATMENT NOTE (updated 1/23)    Patient Name: [name] [name]    Date: 08/06/2022    DOB: 1970-03-23  Insurance: Payor: [employer] / Plan: [employer] VALUE HMO / Product Type: *No Product type* /      Patient DOB verified yes     Visit #   Current / Total 2 24   Time   In / Out 245 325   Total Treatment Time 40   Pain   In / Out 5 3   Subjective Functional Status/Changes: Patient states she was dx with kidney stones.      NEXT PROGRESS NOTE DUE: 08/28/2022    TREATMENT AREA =  Muscle weakness (generalized) [M62.81]    OBJECTIVE    Modalities Rationale:     decrease inflammation and decrease pain to improve patient's ability to progress to PLOF and address remaining functional goals.     min []  Estim Unattended, type/location:                                      []   w/ice    []   w/heat    min []  Estim Attended, type/location:                                     []   w/US     []   w/ice    []   w/heat    []   TENS insruct      min []   Mechanical Traction: type/lbs                   []   pro   []   sup   []   int   []   cont    []   before manual    []   after manual    min []   Ultrasound, settings/location:      min []   Iontophoresis w/ dexamethasone, location:                                               []   take home patch       []   in clinic    min  unbilled []   Ice     []   Heat    location/position:     min []   Paraffin,  details:     min []   Vasopneumatic Device, press/temp:     min []   Whirlpool / Fluido:    If using vaso (only need to measure limb vaso being performed on)      pre-treatment girth :       post-treatment girth :       measured at (landmark location) :      min []   Other:    Skin assessment post-treatment (if applicable):    []   intact    []   redness- no adverse reaction                 [] redness - adverse reaction:      Vasopnuematic compression justification:  Per bilateral girth measures taken and listed above the edema is considered significant and  having an impact on the patient's ADL's  Therapeutic Procedures:  Tx Min Billable or 1:1 Min (if diff from Tx Min) Procedure, Rationale, Specifics   15 15 97110 Therapeutic Exercise (timed):  increase ROM, strength, coordination, balance, and proprioception to improve patient's ability to progress to PLOF and address remaining functional goals. (see flow sheet as applicable)     Details if applicable:    BIKE   INCLINE STRETCH  HAMSTRING STRETCH  Seated march   25 25 97530 Therapeutic Activity (timed):  use of dynamic activities replicating functional movements to increase ROM, strength, coordination, balance, and proprioception in order to improve patient's ability to progress to PLOF and address remaining functional goals.  (see flow sheet as applicable)     Details if applicable:    STANDING MARCH  HR/TR  Seated full cans  Seated bicep curls      97112 Neuromuscular Re-Education (timed):  improve balance, coordination, kinesthetic sense, posture, core stability and proprioception to improve patient's ability to develop conscious control of individual muscles and awareness of position of extremities in order to progress to PLOF and address remaining functional goals. (see flow sheet as applicable)     Details if applicable:       97140 Manual Therapy (timed):  decrease pain and increase ROM to improve patient's ability to progress to PLOF and address remaining functional goals.  The manual therapy interventions were performed at a separate and distinct time from the therapeutic activities interventions . (see flow sheet as applicable)     Details if applicable:       97535 Self Care/Home Management (timed):  improve patient knowledge and understanding of pain reducing techniques, positioning, activity modification, diagnosis/prognosis, and physical therapy expectations, procedures and progression  to improve patient's ability to progress to PLOF and address remaining functional goals.  (see flow sheet as  applicable)     Details if applicable:     40 40 MC BC Totals Reminder: bill using total billable min of TIMED therapeutic procedures (example: do not include dry needle or estim unattended, both untimed codes, in totals to left)  8-22 min = 1 unit; 23-37 min = 2 units; 38-52 min = 3 units; 53-67 min = 4 units; 68-82 min = 5 units   Total Total     [x]   Patient Education billed concurrently with other procedures   [x]  Review HEP    []  Progressed/Changed HEP, detail:    []  Other detail:       Objective Information/Functional Measures/Assessment:  First F/U since IE; initiated per POC  She reports compliance with initial HEP   Following standing HR/TR and marches, patient required seated rest break d/t muscle fatigue.   Patient reported increased challenge with seated full cans secondary to right shoulder pain   Progress as tolerated NV      Patient will continue to benefit from skilled PT / OT services to modify and progress therapeutic interventions, analyze and address functional mobility deficits, analyze and address ROM deficits, analyze and address strength deficits, analyze and address soft tissue restrictions, analyze and cue for proper movement patterns, and analyze and modify for postural abnormalities to address functional deficits and attain remaining goals.    Progress toward goals / Updated goals:  []   See Progress Note/Recertification    Short term goals to be accomplished in 10 visits:   The pt will be IND and compliant with HEP and self management of symptoms.    Status at eval: developed and reviewed  Current:  reports compliance 08/06/2022  The patient will perform 5xSTS test in 18 seconds as an indicator of improved functional mobility.    Status at eval: 21 seconds with pain increase   Current:    The patient will improve right shoulder flexion AROM to 150 deg and B/L shoulder ABD AROM to 120 deg for improved OH activity tolerance and ADL's.   Status at eval: right shoulder flexion 140 deg, ABD 90  deg; left shoulder ABD 110 deg with UT compensation   Current:    The patient will improve B/L shoulder flexion and abduction strength to 4/5 for improved tolerance of household duties.    Status at eval: right shoulder flexion 4-/5 abduction 3/5; left shoulder flexion/abduction 4-/5  Current:    The patient will improve B/L hip  flexion strength to 4/5 for improved walking tolerance.   Status at eval: right 3/5, left 4-/5  Current:      PLAN  [x]   Continue plan of care  [x]   Upgrade activities as tolerated  []   Discharge due to :  []   Other:    Harvel Quale, PT    08/06/2022    2:54 PM    Future Appointments   Date Time Provider Department Center   08/12/2022  9:40 AM [facility] [place] [facility]   08/14/2022  1:20 PM Harvel Quale, PT [facility]   08/20/2022 11:00 AM Harvel Quale, PT [facility]   08/22/2022 12:20 PM Harvel Quale, PT [facility]   08/27/2022 11:00 AM Harvel Quale, PT [facility]   08/29/2022 11:00 AM Harvel Quale, PT [facility]

## 2022-08-12 ENCOUNTER — Inpatient Hospital Stay: Admit: 2022-08-12 | Payer: MEDICARE | Primary: Family Medicine

## 2022-08-12 NOTE — Progress Notes (Signed)
PHYSICAL / OCCUPATIONAL THERAPY - DAILY TREATMENT NOTE (updated 1/23)    Patient Name: [name] [name]    Date: 08/12/2022    DOB: 03/15/1970  Insurance: Payor: [employer] / Plan: [employer] / Product Type: *No Product type* /      Patient DOB verified yes     Visit #   Current / Total 3 24   Time   In / Out 945 938   Total Treatment Time 48   Pain   In / Out 5 nr   Subjective Functional Status/Changes: Patient reports feeling tight today. States she didn't do any of her exercises last week due to having company.      NEXT PROGRESS NOTE DUE: 08/28/2022    TREATMENT AREA =  Muscle weakness (generalized) [M62.81]    OBJECTIVE    Modalities Rationale:     decrease inflammation and decrease pain to improve patient's ability to progress to PLOF and address remaining functional goals.     min []  Estim Unattended, type/location:                                      []   w/ice    []   w/heat    min []  Estim Attended, type/location:                                     []   w/US     []   w/ice    []   w/heat    []   TENS insruct      min []   Mechanical Traction: type/lbs                   []   pro   []   sup   []   int   []   cont    []   before manual    []   after manual    min []   Ultrasound, settings/location:      min []   Iontophoresis w/ dexamethasone, location:                                               []   take home patch       []   in clinic    min  unbilled []   Ice     []   Heat    location/position:     min []   Paraffin,  details:     min []   Vasopneumatic Device, press/temp:     min []   Whirlpool / Fluido:    If using vaso (only need to measure limb vaso being performed on)      pre-treatment girth :       post-treatment girth :       measured at (landmark location) :      min []   Other:    Skin assessment post-treatment (if applicable):    []   intact    []   redness- no adverse reaction                 [] redness - adverse reaction:      Vasopnuematic compression justification:  Per bilateral girth measures  taken and listed above the edema is  considered significant and having an impact on the patient's ADL's     Therapeutic Procedures:  Tx Min Billable or 1:1 Min (if diff from Tx Min) Procedure, Rationale, Specifics    23 [id number] Therapeutic Exercise (timed):  increase ROM, strength, coordination, balance, and proprioception to improve patient's ability to progress to PLOF and address remaining functional goals. (see flow sheet as applicable)     Details if applicable:    BIKE   INCLINE STRETCH  HAMSTRING STRETCH  Ball and band  LAQ      15 97530 Therapeutic Activity (timed):  use of dynamic activities replicating functional movements to increase ROM, strength, coordination, balance, and proprioception in order to improve patient's ability to progress to PLOF and address remaining functional goals.  (see flow sheet as applicable)     Details if applicable:    STANDING MARCH  HR/TR  Seated full cans  Seated bicep curls       - 97112 Neuromuscular Re-Education (timed):  improve balance, coordination, kinesthetic sense, posture, core stability and proprioception to improve patient's ability to develop conscious control of individual muscles and awareness of position of extremities in order to progress to PLOF and address remaining functional goals. (see flow sheet as applicable)     Details if applicable:      - 97140 Manual Therapy (timed):  decrease pain and increase ROM to improve patient's ability to progress to PLOF and address remaining functional goals.  The manual therapy interventions were performed at a separate and distinct time from the therapeutic activities interventions . (see flow sheet as applicable)     Details if applicable:      - 53664 Self Care/Home Management (timed):  improve patient knowledge and understanding of pain reducing techniques, positioning, activity modification, diagnosis/prognosis, and physical therapy expectations, procedures and progression  to improve patient's ability to progress to  PLOF and address remaining functional goals.  (see flow sheet as applicable)     Details if applicable:      38 MC BC Totals Reminder: bill using total billable min of TIMED therapeutic procedures (example: do not include dry needle or estim unattended, both untimed codes, in totals to left)  8-22 min = 1 unit; 23-37 min = 2 units; 38-52 min = 3 units; 53-67 min = 4 units; 68-82 min = 5 units   Total Total     [x]   Patient Education billed concurrently with other procedures   [x]  Review HEP    []  Progressed/Changed HEP, detail:    []  Other detail:       Objective Information/Functional Measures/Assessment:  Patient completed standing exercises this date without rest breaks demonstrating improved endurance  Added seated LAQ for increased quad strength for improved ability to stand safely for longer periods of time. - completed with no issue  Rechecked sit to stand test: 20 seconds but this was tested after exercises. Instructed patient to continue her exercises to optimize her success in reaching her goals.     Patient will continue to benefit from skilled PT / OT services to modify and progress therapeutic interventions, analyze and address functional mobility deficits, analyze and address ROM deficits, analyze and address strength deficits, analyze and address soft tissue restrictions, analyze and cue for proper movement patterns, and analyze and modify for postural abnormalities to address functional deficits and attain remaining goals.    Progress toward goals / Updated goals:  []   See Progress Note/Recertification    Short term goals to be accomplished in  10 visits:   The pt will be IND and compliant with HEP and self management of symptoms.    Status at eval: developed and reviewed  Current:  reports compliance 08/06/2022  The patient will perform 5xSTS test in 18 seconds as an indicator of improved functional mobility.    Status at eval: 21 seconds with pain increase   Current:  22 seconds after exercises.  08/12/2022  The patient will improve right shoulder flexion AROM to 150 deg and B/L shoulder ABD AROM to 120 deg for improved OH activity tolerance and ADL's.   Status at eval: right shoulder flexion 140 deg, ABD 90 deg; left shoulder ABD 110 deg with UT compensation   Current:    The patient will improve B/L shoulder flexion and abduction strength to 4/5 for improved tolerance of household duties.    Status at eval: right shoulder flexion 4-/5 abduction 3/5; left shoulder flexion/abduction 4-/5  Current:    The patient will improve B/L hip  flexion strength to 4/5 for improved walking tolerance.   Status at eval: right 3/5, left 4-/5  Current:      PLAN  [x]   Continue plan of care  [x]   Upgrade activities as tolerated  []   Discharge due to :  []   Other:    Aline August, PTA    08/12/2022    9:53 AM    Future Appointments   Date Time Provider Department Center   08/14/2022  1:20 PM Harvel Quale, PT [facility]   08/20/2022 11:00 AM Harvel Quale, PT [facility]   08/22/2022 12:20 PM Harvel Quale, PT [facility]   08/27/2022 11:00 AM Harvel Quale, PT [facility]   08/29/2022 11:00 AM Harvel Quale, PT [facility]

## 2022-08-14 ENCOUNTER — Encounter: Payer: MEDICARE | Primary: Family Medicine

## 2022-08-14 NOTE — Progress Notes (Signed)
PHYSICAL / OCCUPATIONAL THERAPY - DAILY TREATMENT NOTE (updated 1/23)    Patient Name: [name] [name]    Date: 08/14/2022    DOB: 05-13-1970  Insurance: Payor: [employer] / Plan: [employer] VALUE HMO / Product Type: *No Product type* /      Patient DOB verified yes     Visit #   Current / Total 4 24   Time   In / Out 242 334    Total Treatment Time 52    Pain   In / Out 3/10  3/10    Subjective Functional Status/Changes: Patient states she is feeling okay today.      NEXT PROGRESS NOTE DUE: 08/28/2022    TREATMENT AREA =  Muscle weakness (generalized) [M62.81]    OBJECTIVE    Modalities Rationale:     decrease inflammation and decrease pain to improve patient's ability to progress to PLOF and address remaining functional goals.     min []  Estim Unattended, type/location:                                      []   w/ice    []   w/heat    min []  Estim Attended, type/location:                                     []   w/US     []   w/ice    []   w/heat    []   TENS insruct      min []   Mechanical Traction: type/lbs                   []   pro   []   sup   []   int   []   cont    []   before manual    []   after manual    min []   Ultrasound, settings/location:      min []   Iontophoresis w/ dexamethasone, location:                                               []   take home patch       []   in clinic   10 min  unbilled [x]   Ice     []   Heat    location/position: Seated to C/S     min []   Paraffin,  details:     min []   Vasopneumatic Device, press/temp:     min []   Whirlpool / Fluido:    If using vaso (only need to measure limb vaso being performed on)      pre-treatment girth :       post-treatment girth :       measured at (landmark location) :      min []   Other:    Skin assessment post-treatment (if applicable):    []   intact    []   redness- no adverse reaction                 [] redness - adverse reaction:      Vasopnuematic compression justification:  Per bilateral girth measures taken and listed above the edema is  considered significant and having an  impact on the patient's ADL's     Therapeutic Procedures:  Tx Min Billable or 1:1 Min (if diff from Tx Min) Procedure, Rationale, Specifics   30 30 97110 Therapeutic Exercise (timed):  increase ROM, strength, coordination, balance, and proprioception to improve patient's ability to progress to PLOF and address remaining functional goals. (see flow sheet as applicable)     Details if applicable:    BIKE   INCLINE STRETCH  HAMSTRING STRETCH  Ball and band  LAQ  Seated march with band    12 12 97530 Therapeutic Activity (timed):  use of dynamic activities replicating functional movements to increase ROM, strength, coordination, balance, and proprioception in order to improve patient's ability to progress to PLOF and address remaining functional goals.  (see flow sheet as applicable)     Details if applicable:    Seated bicep curls   Pulley's flexion, ABD     TC:   STANDING MARCH  HR/TR  Seated full cans        - [id number] Neuromuscular Re-Education (timed):  improve balance, coordination, kinesthetic sense, posture, core stability and proprioception to improve patient's ability to develop conscious control of individual muscles and awareness of position of extremities in order to progress to PLOF and address remaining functional goals. (see flow sheet as applicable)     Details if applicable:      - 97140 Manual Therapy (timed):  decrease pain and increase ROM to improve patient's ability to progress to PLOF and address remaining functional goals.  The manual therapy interventions were performed at a separate and distinct time from the therapeutic activities interventions . (see flow sheet as applicable)     Details if applicable:      - 29562 Self Care/Home Management (timed):  improve patient knowledge and understanding of pain reducing techniques, positioning, activity modification, diagnosis/prognosis, and physical therapy expectations, procedures and progression  to improve  patient's ability to progress to PLOF and address remaining functional goals.  (see flow sheet as applicable)     Details if applicable:     42 42 MC BC Totals Reminder: bill using total billable min of TIMED therapeutic procedures (example: do not include dry needle or estim unattended, both untimed codes, in totals to left)  8-22 min = 1 unit; 23-37 min = 2 units; 38-52 min = 3 units; 53-67 min = 4 units; 68-82 min = 5 units   Total Total     [x]   Patient Education billed concurrently with other procedures   [x]  Review HEP    []  Progressed/Changed HEP, detail:    []  Other detail:       Objective Information/Functional Measures/Assessment:  Patient required seated rest break following recumbent bike warm up secondary to HR increase from POTS .   Right shoulder AROM limited compared to IE: flexion 120 deg, abd 80 deg. Patient reports EMG testing with neurologist earlier this week was WNL at right shoulder. Added pulley's for improved AAROM.   Focused on seated exercises today secondary to increased fatigue.   Plan to hold bike warm up PRN NV to allow for increased tolerance of standing exercises     Patient will continue to benefit from skilled PT / OT services to modify and progress therapeutic interventions, analyze and address functional mobility deficits, analyze and address ROM deficits, analyze and address strength deficits, analyze and address soft tissue restrictions, analyze and cue for proper movement patterns, and analyze and modify for postural abnormalities to address functional deficits and attain  remaining goals.    Progress toward goals / Updated goals:  []   See Progress Note/Recertification    Short term goals to be accomplished in 10 visits:   The pt will be IND and compliant with HEP and self management of symptoms.    Status at eval: developed and reviewed  Current:  reports compliance 08/06/2022  The patient will perform 5xSTS test in 18 seconds as an indicator of improved functional mobility.     Status at eval: 21 seconds with pain increase   Current:  22 seconds after exercises. 08/12/2022  The patient will improve right shoulder flexion AROM to 150 deg and B/L shoulder ABD AROM to 120 deg for improved OH activity tolerance and ADL's.   Status at eval: right shoulder flexion 140 deg, ABD 90 deg; left shoulder ABD 110 deg with UT compensation   Current:  right flexion 120 deg and abd 80 deg 08/14/2022  The patient will improve B/L shoulder flexion and abduction strength to 4/5 for improved tolerance of household duties.    Status at eval: right shoulder flexion 4-/5 abduction 3/5; left shoulder flexion/abduction 4-/5  Current:    The patient will improve B/L hip  flexion strength to 4/5 for improved walking tolerance.   Status at eval: right 3/5, left 4-/5  Current:      PLAN  [x]   Continue plan of care  [x]   Upgrade activities as tolerated  []   Discharge due to :  []   Other:    Harvel Quale, PT    08/14/2022    2:54 PM    Future Appointments   Date Time Provider Department Center   08/20/2022 11:00 AM Harvel Quale, PT [facility]   08/22/2022 12:20 PM Harvel Quale, PT [facility]   08/27/2022 11:00 AM Harvel Quale, PT [facility]   08/29/2022 11:00 AM Harvel Quale, PT [facility]

## 2022-08-20 ENCOUNTER — Inpatient Hospital Stay: Admit: 2022-08-20 | Payer: MEDICARE | Primary: Family Medicine

## 2022-08-20 NOTE — Progress Notes (Signed)
PHYSICAL / OCCUPATIONAL THERAPY - DAILY TREATMENT NOTE (updated 1/23)    Patient Name: [name] [name]    Date: 08/20/2022    DOB: 15-Dec-1970  Insurance: Payor: [employer] / Plan: [employer] VALUE HMO / Product Type: *No Product type* /      Patient DOB verified yes     Visit #   Current / Total 5 24   Time   In / Out 11:11 1155    Total Treatment Time 44    Pain   In / Out 3/10  3/10    Subjective Functional Status/Changes: Patient reports cardiologist is prescribing beta blocker to address HR spikes due to POTS.      NEXT PROGRESS NOTE DUE: 08/28/2022    TREATMENT AREA =  Muscle weakness (generalized) [M62.81]    OBJECTIVE    Modalities Rationale:     decrease inflammation and decrease pain to improve patient's ability to progress to PLOF and address remaining functional goals.     min []  Estim Unattended, type/location:                                      []   w/ice    []   w/heat    min []  Estim Attended, type/location:                                     []   w/US     []   w/ice    []   w/heat    []   TENS insruct      min []   Mechanical Traction: type/lbs                   []   pro   []   sup   []   int   []   cont    []   before manual    []   after manual    min []   Ultrasound, settings/location:      min []   Iontophoresis w/ dexamethasone, location:                                               []   take home patch       []   in clinic   PD  min  unbilled [x]   Ice     []   Heat    location/position: Seated to C/S     min []   Paraffin,  details:     min []   Vasopneumatic Device, press/temp:     min []   Whirlpool / Fluido:    If using vaso (only need to measure limb vaso being performed on)      pre-treatment girth :       post-treatment girth :       measured at (landmark location) :      min []   Other:    Skin assessment post-treatment (if applicable):    []   intact    []   redness- no adverse reaction                 [] redness - adverse reaction:      Vasopnuematic compression justification:  Per bilateral girth  measures taken and listed above  the edema is considered significant and having an impact on the patient's ADL's     Therapeutic Procedures:  Tx Min Billable or 1:1 Min (if diff from Tx Min) Procedure, Rationale, Specifics   14  14 97110 Therapeutic Exercise (timed):  increase ROM, strength, coordination, balance, and proprioception to improve patient's ability to progress to PLOF and address remaining functional goals. (see flow sheet as applicable)     Details if applicable:    BIKE   INCLINE STRETCH  HAMSTRING STRETCH  Ball and band  LAQ  Seated march   HEP UPDATE AND REVIEW    30 . 30 97530 Therapeutic Activity (timed):  use of dynamic activities replicating functional movements to increase ROM, strength, coordination, balance, and proprioception in order to improve patient's ability to progress to PLOF and address remaining functional goals.  (see flow sheet as applicable)     Details if applicable:    Seated bicep curls   Pulley's flexion, ABD   Standing hip 3 way   SLR   Supine chest press   Supine wand flexion   Seated scap stabs    TC:   STANDING MARCH  HR/TR  Seated full cans    - [id number] Neuromuscular Re-Education (timed):  improve balance, coordination, kinesthetic sense, posture, core stability and proprioception to improve patient's ability to develop conscious control of individual muscles and awareness of position of extremities in order to progress to PLOF and address remaining functional goals. (see flow sheet as applicable)     Details if applicable:      - 97140 Manual Therapy (timed):  decrease pain and increase ROM to improve patient's ability to progress to PLOF and address remaining functional goals.  The manual therapy interventions were performed at a separate and distinct time from the therapeutic activities interventions . (see flow sheet as applicable)     Details if applicable:      - 03474 Self Care/Home Management (timed):  improve patient knowledge and understanding of pain reducing  techniques, positioning, activity modification, diagnosis/prognosis, and physical therapy expectations, procedures and progression  to improve patient's ability to progress to PLOF and address remaining functional goals.  (see flow sheet as applicable)     Details if applicable:     44  44 MC BC Totals Reminder: bill using total billable min of TIMED therapeutic procedures (example: do not include dry needle or estim unattended, both untimed codes, in totals to left)  8-22 min = 1 unit; 23-37 min = 2 units; 38-52 min = 3 units; 53-67 min = 4 units; 68-82 min = 5 units   Total Total     [x]   Patient Education billed concurrently with other procedures   [x]  Review HEP    []  Progressed/Changed HEP, detail:    []  Other detail:       Objective Information/Functional Measures/Assessment:  No change in functional mobility as time required for 5xSTS test remains 22 seconds without UE support   Right shoulder flexion AROM 125 deg   Added standing hip strengthening exercises for improved walking tolerance and balance   Progressed right shoulder strengthening exercises for improved ADL tolerance.   Updated and reviewed HEP     Increased muscle fatigue reported at end of session without pain increase.    Progress as tolerated NV     Patient will continue to benefit from skilled PT / OT services to modify and progress therapeutic interventions, analyze and address functional mobility deficits, analyze and address ROM deficits,  analyze and address strength deficits, analyze and address soft tissue restrictions, analyze and cue for proper movement patterns, and analyze and modify for postural abnormalities to address functional deficits and attain remaining goals.    Progress toward goals / Updated goals:  []   See Progress Note/Recertification    Short term goals to be accomplished in 10 visits:   The pt will be IND and compliant with HEP and self management of symptoms.    Status at eval: developed and reviewed  Current:  reports  compliance 08/06/2022  The patient will perform 5xSTS test in 18 seconds as an indicator of improved functional mobility.    Status at eval: 21 seconds with pain increase   Current:  22 seconds after exercises. 08/12/2022, remains 22 seconds 08/20/2022  The patient will improve right shoulder flexion AROM to 150 deg and B/L shoulder ABD AROM to 120 deg for improved OH activity tolerance and ADL's.   Status at eval: right shoulder flexion 140 deg, ABD 90 deg; left shoulder ABD 110 deg with UT compensation   Current:  right flexion 120 deg and abd 80 deg 08/14/2022  The patient will improve B/L shoulder flexion and abduction strength to 4/5 for improved tolerance of household duties.    Status at eval: right shoulder flexion 4-/5 abduction 3/5; left shoulder flexion/abduction 4-/5  Current:    The patient will improve B/L hip  flexion strength to 4/5 for improved walking tolerance.   Status at eval: right 3/5, left 4-/5  Current:      PLAN  [x]   Continue plan of care  [x]   Upgrade activities as tolerated  []   Discharge due to :  []   Other:    Harvel Quale, PT    08/20/2022    11:15 AM    Future Appointments   Date Time Provider Department Center   08/22/2022 12:20 PM Harvel Quale, PT [facility]   08/27/2022 11:00 AM Harvel Quale, PT [facility]   08/29/2022 11:00 AM Harvel Quale, PT [facility]

## 2022-08-22 ENCOUNTER — Inpatient Hospital Stay: Admit: 2022-08-22 | Payer: MEDICARE | Primary: Family Medicine

## 2022-08-22 NOTE — Progress Notes (Signed)
PHYSICAL / OCCUPATIONAL THERAPY - DAILY TREATMENT NOTE (updated 1/23)    Patient Name: [name] [name]    Date: 08/22/2022    DOB: 25-Jul-1970  Insurance: Payor: [employer] / Plan: [employer] VAL[employer] / Product Type: *No Product type* /      Patient DOB verified yes     Visit #   Current / Total 6 24   Time   In / Out 1220 101    Total Treatment Time 41    Pain   In / Out 5/10 4/10    Subjective Functional Status/Changes: Patient states she is a little more achy today, not sure why. She reports performing HEP yesterday and had no questions regarding new exercises.       NEXT PROGRESS NOTE DUE: 08/28/2022    TREATMENT AREA =  Muscle weakness (generalized) [M62.81]    OBJECTIVE    Modalities Rationale:     decrease inflammation and decrease pain to improve patient's ability to progress to PLOF and address remaining functional goals.     min []  Estim Unattended, type/location:                                      []   w/ice    []   w/heat    min []  Estim Attended, type/location:                                     []   w/US     []   w/ice    []   w/heat    []   TENS insruct      min []   Mechanical Traction: type/lbs                   []   pro   []   sup   []   int   []   cont    []   before manual    []   after manual    min []   Ultrasound, settings/location:      min []   Iontophoresis w/ dexamethasone, location:                                               []   take home patch       []   in clinic   PD  min  unbilled [x]   Ice     []   Heat    location/position: Seated to C/S     min []   Paraffin,  details:     min []   Vasopneumatic Device, press/temp:     min []   Whirlpool / Fluido:    If using vaso (only need to measure limb vaso being performed on)      pre-treatment girth :       post-treatment girth :       measured at (landmark location) :      min []   Other:    Skin assessment post-treatment (if applicable):    []   intact    []   redness- no adverse reaction                 [] redness - adverse reaction:       Vasopnuematic compression  justification:  Per bilateral girth measures taken and listed above the edema is considered significant and having an impact on the patient's ADL's     Therapeutic Procedures:  Tx Min Billable or 1:1 Min (if diff from Tx Min) Procedure, Rationale, Specifics   18  18 [id number] Therapeutic Exercise (timed):  increase ROM, strength, coordination, balance, and proprioception to improve patient's ability to progress to PLOF and address remaining functional goals. (see flow sheet as applicable)     Details if applicable:    Ball and band  LAQ  Seated march   HEP UPDATE AND REVIEW     TC  BIKE   INCLINE STRETCH  HAMSTRING STRETCH-TC   23 23 97530 Therapeutic Activity (timed):  use of dynamic activities replicating functional movements to increase ROM, strength, coordination, balance, and proprioception in order to improve patient's ability to progress to PLOF and address remaining functional goals.  (see flow sheet as applicable)     Details if applicable:    Finger ladder flexion, scaption   Pulley's flexion, ABD   Standing hip 3 way       TC:   STANDING MARCH  HR/TR  Seated full cans  SLR   Supine chest press   Supine wand flexion   Seated scap stabs    - [id number] Neuromuscular Re-Education (timed):  improve balance, coordination, kinesthetic sense, posture, core stability and proprioception to improve patient's ability to develop conscious control of individual muscles and awareness of position of extremities in order to progress to PLOF and address remaining functional goals. (see flow sheet as applicable)     Details if applicable:      - 97140 Manual Therapy (timed):  decrease pain and increase ROM to improve patient's ability to progress to PLOF and address remaining functional goals.  The manual therapy interventions were performed at a separate and distinct time from the therapeutic activities interventions . (see flow sheet as applicable)     Details if applicable:      - 16109 Self Care/Home  Management (timed):  improve patient knowledge and understanding of pain reducing techniques, positioning, activity modification, diagnosis/prognosis, and physical therapy expectations, procedures and progression  to improve patient's ability to progress to PLOF and address remaining functional goals.  (see flow sheet as applicable)     Details if applicable:     41   41  MC BC Totals Reminder: bill using total billable min of TIMED therapeutic procedures (example: do not include dry needle or estim unattended, both untimed codes, in totals to left)  8-22 min = 1 unit; 23-37 min = 2 units; 38-52 min = 3 units; 53-67 min = 4 units; 68-82 min = 5 units   Total Total     [x]   Patient Education billed concurrently with other procedures   [x]  Review HEP    []  Progressed/Changed HEP, detail:    []  Other detail:       Objective Information/Functional Measures/Assessment:  Patient reports increased muscle aches today  Introduced finger ladder for improved B/L shoulder AAROM, increased challenge with scaption. Provided HEP printout to perform at home.   B/L shoulder strength: flex 4/5 and abd 4-/5 indicating improving OH activity tolerance   Some cramping reported upon standing hip abd   Patient reported increased fatigue and dizziness following standing hip 3 way. HR 91 BPM in seated position. Patient had seated rest break for 3 minutes and then was able to resume seated exercises.   Plan to progress as  tolerated NV      Patient will continue to benefit from skilled PT / OT services to modify and progress therapeutic interventions, analyze and address functional mobility deficits, analyze and address ROM deficits, analyze and address strength deficits, analyze and address soft tissue restrictions, analyze and cue for proper movement patterns, and analyze and modify for postural abnormalities to address functional deficits and attain remaining goals.    Progress toward goals / Updated goals:  []   See Progress  Note/Recertification    Short term goals to be accomplished in 10 visits:   The pt will be IND and compliant with HEP and self management of symptoms.    Status at eval: developed and reviewed  Current:  reports compliance 08/06/2022  The patient will perform 5xSTS test in 18 seconds as an indicator of improved functional mobility.    Status at eval: 21 seconds with pain increase   Current:  22 seconds after exercises. 08/12/2022, remains 22 seconds 08/20/2022  The patient will improve right shoulder flexion AROM to 150 deg and B/L shoulder ABD AROM to 120 deg for improved OH activity tolerance and ADL's.   Status at eval: right shoulder flexion 140 deg, ABD 90 deg; left shoulder ABD 110 deg with UT compensation   Current:  right flexion 120 deg and abd 80 deg 08/14/2022  The patient will improve B/L shoulder flexion and abduction strength to 4/5 for improved tolerance of household duties.    Status at eval: right shoulder flexion 4-/5 abduction 3/5; left shoulder flexion/abduction 4-/5  Current:  progressing, B/L flex 4/5 and ABD 4-/5 08/22/2022  The patient will improve B/L hip  flexion strength to 4/5 for improved walking tolerance.   Status at eval: right 3/5, left 4-/5  Current:      PLAN  [x]   Continue plan of care  [x]   Upgrade activities as tolerated  []   Discharge due to :  [x]   Other: plan to reassess NV    Harvel Quale, PT    08/22/2022    12:23 PM    Future Appointments   Date Time Provider Department Center   08/27/2022 11:00 AM Harvel Quale, PT [facility]   08/29/2022 11:00 AM Harvel Quale, PT [facility]

## 2022-08-27 ENCOUNTER — Inpatient Hospital Stay: Admit: 2022-08-27 | Payer: MEDICARE | Primary: Family Medicine

## 2022-08-27 NOTE — Progress Notes (Signed)
PHYSICAL / OCCUPATIONAL THERAPY - DAILY TREATMENT NOTE (updated 1/23)    Patient Name: [name] [name]    Date: 08/27/2022    DOB: 06/12/1970  Insurance: Payor: [employer] / Plan: [employer] VAL[employer] HMO / Product Type: *No Product type* /      Patient DOB verified yes     Visit #   Current / Total 7 24   Time   In / Out 1102  1142    Total Treatment Time 40    Pain   In / Out 2  2    Subjective Functional Status/Changes: % improvement: 50%  Max pain 6/10  Avg pain 2-3/10  Min pain 1/10    Current improvements: improved shoulder strength, improved neck pain, good tolerance and compliance to HEP, improving ability to reach overhead   Remaining functional limitations: weakness in legs, difficulty with stairs     QuickDASH: 47%       NEXT PROGRESS NOTE DUE: 08/28/2022    TREATMENT AREA =  Muscle weakness (generalized) [M62.81]    OBJECTIVE    Modalities Rationale:     decrease inflammation and decrease pain to improve patient's ability to progress to PLOF and address remaining functional goals.     min []  Estim Unattended, type/location:                                      []   w/ice    []   w/heat    min []  Estim Attended, type/location:                                     []   w/US     []   w/ice    []   w/heat    []   TENS insruct      min []   Mechanical Traction: type/lbs                   []   pro   []   sup   []   int   []   cont    []   before manual    []   after manual    min []   Ultrasound, settings/location:      min []   Iontophoresis w/ dexamethasone, location:                                               []   take home patch       []   in clinic   PD  min  unbilled [x]   Ice     []   Heat    location/position: Seated to C/S     min []   Paraffin,  details:     min []   Vasopneumatic Device, press/temp:     min []   Whirlpool / Fluido:    If using vaso (only need to measure limb vaso being performed on)      pre-treatment girth :       post-treatment girth :       measured at (landmark location) :      min []   Other:     Skin assessment post-treatment (if applicable):    []   intact    []   redness- no adverse reaction                 [] redness - adverse reaction:      Vasopnuematic compression justification:  Per bilateral girth measures taken and listed above the edema is considered significant and having an impact on the patient's ADL's     Therapeutic Procedures:  Tx Min Billable or 1:1 Min (if diff from Tx Min) Procedure, Rationale, Specifics   25   25  97110 Therapeutic Exercise (timed):  increase ROM, strength, coordination, balance, and proprioception to improve patient's ability to progress to PLOF and address remaining functional goals. (see flow sheet as applicable)     Details if applicable:    REASSESSMENT, QUICKDASH  LAQ  HEP UPDATE AND REVIEW       TC  Ball and band  Seated march   BIKE   INCLINE STRETCH  HAMSTRING STRETCH-TC   15  14  97530 Therapeutic Activity (timed):  use of dynamic activities replicating functional movements to increase ROM, strength, coordination, balance, and proprioception in order to improve patient's ability to progress to PLOF and address remaining functional goals.  (see flow sheet as applicable)     Details if applicable:    Finger ladder flexion, scaption   Pulley's flexion, ABD   Seated full cans with TA    TC:   STANDING MARCH  HR/TR  Seated full cans  SLR   Supine chest press   Supine wand flexion   Seated scap stabs    - [id number] Neuromuscular Re-Education (timed):  improve balance, coordination, kinesthetic sense, posture, core stability and proprioception to improve patient's ability to develop conscious control of individual muscles and awareness of position of extremities in order to progress to PLOF and address remaining functional goals. (see flow sheet as applicable)     Details if applicable:      - 97140 Manual Therapy (timed):  decrease pain and increase ROM to improve patient's ability to progress to PLOF and address remaining functional goals.  The manual therapy  interventions were performed at a separate and distinct time from the therapeutic activities interventions . (see flow sheet as applicable)     Details if applicable:      - 09811 Self Care/Home Management (timed):  improve patient knowledge and understanding of pain reducing techniques, positioning, activity modification, diagnosis/prognosis, and physical therapy expectations, procedures and progression  to improve patient's ability to progress to PLOF and address remaining functional goals.  (see flow sheet as applicable)     Details if applicable:     40    39 MC BC Totals Reminder: bill using total billable min of TIMED therapeutic procedures (example: do not include dry needle or estim unattended, both untimed codes, in totals to left)  8-22 min = 1 unit; 23-37 min = 2 units; 38-52 min = 3 units; 53-67 min = 4 units; 68-82 min = 5 units   Total Total     [x]   Patient Education billed concurrently with other procedures   [x]  Review HEP    []  Progressed/Changed HEP, detail:    []  Other detail:       Objective Information/Functional Measures/Assessment:  The patient presents for reassessment following 7 visits to address generalized weakness, right shoulder pain    Improvement in functional mobility indicated by  QuickDASH score change of -10 points to 47%  Right shoulder AROM: flexion 135 deg, ABD 110 deg indicating improving OH mobility   Right shoulder flexion strength  improved to 4/5 with pain, ABD remains 4-/5 with pain   Improving functional mobility as patient performed 5xSTS test in 19 seconds, nearly meetin STG2.   Noted  improved B/L hip flexion contributing to improved walking tolerance: right 4+/5, left 4+/5    Noted improved B/L HS length: right -19 deg and left -10 deg (at IE: right -70 deg, left -45 deg). SLR test (-) on the right.   The patient has maintained good compliance with HEP.   Plan to continue 1-2x/week to continue maximizing BUE/BLE strength and improve functional mobility      Patient  will continue to benefit from skilled PT / OT services to modify and progress therapeutic interventions, analyze and address functional mobility deficits, analyze and address ROM deficits, analyze and address strength deficits, analyze and address soft tissue restrictions, analyze and cue for proper movement patterns, and analyze and modify for postural abnormalities to address functional deficits and attain remaining goals.    Progress toward goals / Updated goals:  []   See Progress Note/Recertification    Short term goals to be accomplished in 10 visits:   The pt will be IND and compliant with HEP and self management of symptoms.    Status at eval: developed and reviewed  Current:  reports compliance 08/06/2022, compliant 08/27/2022  The patient will perform 5xSTS test in 18 seconds as an indicator of improved functional mobility.    Status at eval: 21 seconds with pain increase   Current:  22 seconds after exercises. 08/12/2022, remains 22 seconds 08/20/2022, NEARLY MET 19 seconds 08/27/2022  The patient will improve right shoulder flexion AROM to 150 deg and B/L shoulder ABD AROM to 120 deg for improved OH activity tolerance and ADL's.   Status at eval: right shoulder flexion 140 deg, ABD 90 deg; left shoulder ABD 110 deg with UT compensation   Current:  right flexion 120 deg and abd 80 deg 08/14/2022,  flexion 135 deg, ABD 110 deg 08/27/2022  The patient will improve B/L shoulder flexion and abduction strength to 4/5 for improved tolerance of household duties.    Status at eval: right shoulder flexion 4-/5 abduction 3/5; left shoulder flexion/abduction 4-/5  Current:  progressing, B/L flex 4/5 and ABD 4-/5 08/22/2022, flex 4/5 p! ABD 4-/5 p! 08/27/2022  The patient will improve B/L hip  flexion strength to 4/5 for improved walking tolerance.   Status at eval: right 3/5, left 4-/5  Current:  right 4+/5, left 4+/5  08/27/2022    PLAN  [x]   Continue plan of care  [x]   Upgrade activities as tolerated  []   Discharge  due to :  [x]   Other: Pt to continue current treatment  2 times a week for the remainder of the 24 visits. Pt to be re-evaluated after the next 10 visits or 30 days which ever comes first.        Harvel Quale, PT    08/27/2022    11:08 AM    Future Appointments   Date Time Provider Department Center   08/29/2022 11:00 AM Harvel Quale, PT [facility]

## 2022-08-27 NOTE — Progress Notes (Signed)
[facility]  [address] [place], [place] [address]  Phone: [phone]   [phone]  PHYSICAL THERAPY PROGRESS NOTE  Patient Name: [name] [name] DOB: Aug 30, 1970   Treatment/Medical Diagnosis: Muscle weakness (generalized) [M62.81]   Referral Source: Honor Junes, DO     Date of Initial Visit: 07/29/2022 Attended Visits: 7 Missed Visits: 0     SUMMARY OF TREATMENT  The pt has been seen for 7 visits to address generalized weakness, right shoulder pain. Therapeutic interventions have included therapeutic exercise, therapeutic activity, neuromuscular re-education, manual treatment, modalities and patient education.       CURRENT STATUS  % improvement: 50%  Max pain 6/10  Avg pain 2-3/10  Min pain 1/10     Current improvements: improved shoulder strength, improved neck pain, good tolerance and compliance to HEP, improving ability to reach overhead   Remaining functional limitations: weakness in legs, difficulty with stairs      QuickDASH: 47%      Objective Information/Functional Measures/Assessment:  The patient presents for reassessment following 7 visits to address generalized weakness, right shoulder pain    Improvement in functional mobility indicated by  QuickDASH score change of -10 points to 47%  Right shoulder AROM: flexion 135 deg, ABD 110 deg indicating improving OH mobility   Right shoulder flexion strength improved to 4/5 with pain, ABD remains 4-/5 with pain   Improving functional mobility as patient performed 5xSTS test in 19 seconds, nearly meetin STG2.   Noted  improved B/L hip flexion contributing to improved walking tolerance: right 4+/5, left 4+/5    Noted improved B/L HS length: right -19 deg and left -10 deg (at IE: right -70 deg, left -45 deg). SLR test (-) on the right.   The patient has maintained good compliance with HEP.   Plan to continue 1-2x/week to continue maximizing BUE/BLE strength and improve functional  mobility      PROGRESS TOWARDS GOALS:  Short term goals to be accomplished in 10 visits:   The pt will be IND and compliant with HEP and self management of symptoms.    Status at eval: developed and reviewed  Current:  reports compliance 08/06/2022, compliant 08/27/2022  The patient will perform 5xSTS test in 18 seconds as an indicator of improved functional mobility.    Status at eval: 21 seconds with pain increase   Current:  22 seconds after exercises. 08/12/2022, remains 22 seconds 08/20/2022, NEARLY MET 19 seconds 08/27/2022  The patient will improve right shoulder flexion AROM to 150 deg and B/L shoulder ABD AROM to 120 deg for improved OH activity tolerance and ADL's.   Status at eval: right shoulder flexion 140 deg, ABD 90 deg; left shoulder ABD 110 deg with UT compensation   Current:  right flexion 120 deg and abd 80 deg 08/14/2022,  flexion 135 deg, ABD 110 deg 08/27/2022  The patient will improve B/L shoulder flexion and abduction strength to 4/5 for improved tolerance of household duties.    Status at eval: right shoulder flexion 4-/5 abduction 3/5; left shoulder flexion/abduction 4-/5  Current:  progressing, B/L flex 4/5 and ABD 4-/5 08/22/2022, flex 4/5 p! ABD 4-/5 p! 08/27/2022  The patient will improve B/L hip  flexion strength to 4/5 for improved walking tolerance.   Status at eval: right 3/5, left 4-/5  Current:  right 4+/5, left 4+/5  08/27/2022    Long term goals to be accomplished in 24 visits:  The patient will perform 5xSTS test in 14 seconds as an indicator of improved functional mobility.   Status at eval: 21 seconds with pain increase   Status at PN (08/27/2022): 19 seconds   Current:    The patient will improve right shoulder flexion AROM to 160 deg and B/L shoulder ABD AROM to 130 deg for improved OH activity tolerance and ADL's.   Status at eval: right shoulder flexion 140 deg, ABD 90 deg; left shoulder ABD 110 deg with UT compensation  Status at PN (08/27/2022):  flexion 135 deg, ABD 110  deg   Current:    The patient will improve B/L shoulder flexion and abduction strength to 5/5 for improved tolerance of household duties.    Status at eval: right shoulder flexion 4-/5 abduction 3/5; left shoulder flexion/abduction 4-/5  Status at PN (08/27/2022): flex 4/5 p! ABD 4-/5 p!   Current:    The patient will improve B/L hip strength globally to 5/5 for improved walking tolerance.   Status at eval: right flexion 3/5, left flexion 4-/5  Status at PN (08/27/2022):  right 4+/5, left 4+/5    Current:    The patient will improve QuickDASH  score to 38% as a functional indicator of improved mobility.    Status at eval: 57%  Status at PN (08/27/2022): 47%   Current:       Payor: [employer] / Plan: [employer] VAL[employer] HMO / Product Type: *No Product type* /     Frequency / Duration:    Pt to continue current treatment  2 times a week for the remainder of the 24 visits. Pt to be re-evaluated after the next 10 visits or 30 days which ever comes first.     RECOMMENDATIONS  We would like to continue therapy for progress to goals stated above.  Continue per initial Plan of Care.    If you have any questions/comments please contact us directly.  Thank you for allowing [place] to assist in the care of your patient.    Harvel Quale, PT       08/27/2022       11:37 AM  REPORTING PERIOD:006/24/2024-08/27/2022

## 2022-08-29 ENCOUNTER — Encounter: Payer: MEDICARE | Primary: Family Medicine

## 2022-09-03 ENCOUNTER — Inpatient Hospital Stay: Admit: 2022-09-03 | Payer: MEDICARE | Primary: Family Medicine

## 2022-09-03 NOTE — Progress Notes (Addendum)
PHYSICAL / OCCUPATIONAL THERAPY - DAILY TREATMENT NOTE (updated 1/23)    Patient Name: [name] [name]    Date: 09/03/2022    DOB: Apr 19, 1970  Insurance: Payor: [employer] / Plan: [employer] VAL[employer] / Product Type: *No Product type* /      Patient DOB verified yes     Visit #   Current / Total 8 24   Time   In / Out 941   1031    Total Treatment Time 40    Pain   In / Out 5  2    Subjective Functional Status/Changes: Patient states her back is bothering her today.      NEXT PROGRESS NOTE DUE: 09/25/2022    TREATMENT AREA =  Muscle weakness (generalized) [M62.81]    OBJECTIVE    Modalities Rationale:     decrease inflammation and decrease pain to improve patient's ability to progress to PLOF and address remaining functional goals.     min []  Estim Unattended, type/location:                                      []   w/ice    []   w/heat    min []  Estim Attended, type/location:                                     []   w/US     []   w/ice    []   w/heat    []   TENS insruct      min []   Mechanical Traction: type/lbs                   []   pro   []   sup   []   int   []   cont    []   before manual    []   after manual    min []   Ultrasound, settings/location:      min []   Iontophoresis w/ dexamethasone, location:                                               []   take home patch       []   in clinic   10  min  unbilled []   Ice     [x]   Heat    location/position: Seated to L/S    min []   Paraffin,  details:     min []   Vasopneumatic Device, press/temp:     min []   Whirlpool / Fluido:    If using vaso (only need to measure limb vaso being performed on)      pre-treatment girth :       post-treatment girth :       measured at (landmark location) :      min []   Other:    Skin assessment post-treatment (if applicable):    []   intact    []   redness- no adverse reaction                 [] redness - adverse reaction:      Vasopnuematic compression justification:  Per bilateral girth measures taken and listed above the edema is  considered significant  and having an impact on the patient's ADL's     Therapeutic Procedures:  Tx Min Billable or 1:1 Min (if diff from Tx Min) Procedure, Rationale, Specifics   15    15 97110 Therapeutic Exercise (timed):  increase ROM, strength, coordination, balance, and proprioception to improve patient's ability to progress to PLOF and address remaining functional goals. (see flow sheet as applicable)     Details if applicable:    INCLINE STRETCH  HAMSTRING STRETCH  LAQ  SB iso seated x3    TC  Ball and band  Seated march   BIKE    25   25 97530 Therapeutic Activity (timed):  use of dynamic activities replicating functional movements to increase ROM, strength, coordination, balance, and proprioception in order to improve patient's ability to progress to PLOF and address remaining functional goals.  (see flow sheet as applicable)     Details if applicable:    Finger ladder flexion, scaption   Pulley's flexion, ABD   HR/TR  Step ups fwd      x  x  97112 Neuromuscular Re-Education (timed):  improve balance, coordination, kinesthetic sense, posture, core stability and proprioception to improve patient's ability to develop conscious control of individual muscles and awareness of position of extremities in order to progress to PLOF and address remaining functional goals. (see flow sheet as applicable)     Details if applicable:         - 97140 Manual Therapy (timed):  decrease pain and increase ROM to improve patient's ability to progress to PLOF and address remaining functional goals.  The manual therapy interventions were performed at a separate and distinct time from the therapeutic activities interventions . (see flow sheet as applicable)     Details if applicable:      - 44010 Self Care/Home Management (timed):  improve patient knowledge and understanding of pain reducing techniques, positioning, activity modification, diagnosis/prognosis, and physical therapy expectations, procedures and progression  to  improve patient's ability to progress to PLOF and address remaining functional goals.  (see flow sheet as applicable)     Details if applicable:     40     40 MC BC Totals Reminder: bill using total billable min of TIMED therapeutic procedures (example: do not include dry needle or estim unattended, both untimed codes, in totals to left)  8-22 min = 1 unit; 23-37 min = 2 units; 38-52 min = 3 units; 53-67 min = 4 units; 68-82 min = 5 units   Total Total     [x]   Patient Education billed concurrently with other procedures   [x]  Review HEP    []  Progressed/Changed HEP, detail:    []  Other detail:       Objective Information/Functional Measures/Assessment:  First F/U since PN last visit   Patient presents with c/o increased LBP today   Progressed LE strengthening exercises per flowsheet. Increased muscle fatigue reported at stance leg during hip 3 way  No change in shoulder AROM:  flexion 135 deg RIGHT   Progress as tolerated NV        Patient will continue to benefit from skilled PT / OT services to modify and progress therapeutic interventions, analyze and address functional mobility deficits, analyze and address ROM deficits, analyze and address strength deficits, analyze and address soft tissue restrictions, analyze and cue for proper movement patterns, and analyze and modify for postural abnormalities to address functional deficits and attain remaining goals.    Progress toward goals / Updated  goals:  []   See Progress Note/Recertification  Long term goals to be accomplished in 17 visits:   The patient will perform 5xSTS test in 14 seconds as an indicator of improved functional mobility.   Status at eval: 21 seconds with pain increase   Status at PN (08/27/2022): 19 seconds   Current:    The patient will improve right shoulder flexion AROM to 160 deg and B/L shoulder ABD AROM to 130 deg for improved OH activity tolerance and ADL's.   Status at eval: right shoulder flexion 140 deg, ABD 90 deg; left shoulder ABD 110  deg with UT compensation  Status at PN (08/27/2022):  flexion 135 deg, ABD 110 deg   Current:   flexion 135 deg 09/03/2022  The patient will improve B/L shoulder flexion and abduction strength to 5/5 for improved tolerance of household duties.    Status at eval: right shoulder flexion 4-/5 abduction 3/5; left shoulder flexion/abduction 4-/5  Status at PN (08/27/2022): flex 4/5 p! ABD 4-/5 p!   Current:    The patient will improve B/L hip strength globally to 5/5 for improved walking tolerance.   Status at eval: right flexion 3/5, left flexion 4-/5  Status at PN (08/27/2022):  right 4+/5, left 4+/5    Current:    The patient will improve QuickDASH  score to 38% as a functional indicator of improved mobility.    Status at eval: 57%  Status at PN (08/27/2022): 47%   Current:       PLAN  [x]   Continue plan of care  [x]   Upgrade activities as tolerated  []   Discharge due to :  [x]   Other: progress as tolerated NV      Harvel Quale, PT    09/03/2022    9:47 AM    Future Appointments   Date Time Provider Department Center   09/11/2022 11:40 AM Harvel Quale, PT [facility]   09/18/2022 11:40 AM Harvel Quale, PT [facility]   09/25/2022 11:40 AM Harvel Quale, PT [facility]

## 2022-09-11 ENCOUNTER — Inpatient Hospital Stay: Admit: 2022-09-11 | Payer: MEDICARE | Primary: Family Medicine

## 2022-09-11 NOTE — Progress Notes (Signed)
PHYSICAL / OCCUPATIONAL THERAPY - DAILY TREATMENT NOTE (updated 1/23)    Patient Name: [name] [name]    Date: 09/11/2022    DOB: 04-08-1970  Insurance: Payor: [employer] / Plan: [employer] VALUE HMO / Product Type: *No Product type* /      Patient DOB verified yes     Visit #   Current / Total 9 24   Time   In / Out 1144 1226    Total Treatment Time 42    Pain   In / Out 2 2    Subjective Functional Status/Changes: Patient states her right shoulder is bothering her a little more from sleeping on her side.       NEXT PROGRESS NOTE DUE: 09/25/2022    TREATMENT AREA =  Muscle weakness (generalized) [M62.81]    OBJECTIVE    Modalities Rationale:     decrease inflammation and decrease pain to improve patient's ability to progress to PLOF and address remaining functional goals.     min []  Estim Unattended, type/location:                                      []   w/ice    []   w/heat    min []  Estim Attended, type/location:                                     []   w/US     []   w/ice    []   w/heat    []   TENS insruct      min []   Mechanical Traction: type/lbs                   []   pro   []   sup   []   int   []   cont    []   before manual    []   after manual    min []   Ultrasound, settings/location:      min []   Iontophoresis w/ dexamethasone, location:                                               []   take home patch       []   in clinic   PD   min  unbilled []   Ice     [x]   Heat    location/position: Seated to L/S    min []   Paraffin,  details:     min []   Vasopneumatic Device, press/temp:     min []   Whirlpool / Fluido:    If using vaso (only need to measure limb vaso being performed on)      pre-treatment girth :       post-treatment girth :       measured at (landmark location) :      min []   Other:    Skin assessment post-treatment (if applicable):    []   intact    []   redness- no adverse reaction                 [] redness - adverse reaction:      Vasopnuematic compression justification:  Per bilateral girth  measures taken and  listed above the edema is considered significant and having an impact on the patient's ADL's     Therapeutic Procedures:  Tx Min Billable or 1:1 Min (if diff from Tx Min) Procedure, Rationale, Specifics   25     25  97110 Therapeutic Exercise (timed):  increase ROM, strength, coordination, balance, and proprioception to improve patient's ability to progress to PLOF and address remaining functional goals. (see flow sheet as applicable)     Details if applicable:    INCLINE STRETCH  HAMSTRING STRETCH  LAQ  Seated march   SB iso seated x3  Standing HS curl    17    17  97530 Therapeutic Activity (timed):  use of dynamic activities replicating functional movements to increase ROM, strength, coordination, balance, and proprioception in order to improve patient's ability to progress to PLOF and address remaining functional goals.  (see flow sheet as applicable)     Details if applicable:    Finger ladder flexion, scaption   Pulley's flexion, ABD   HR/TR  TB rows, ext         TC:   Step ups fwd      x  x  97112 Neuromuscular Re-Education (timed):  improve balance, coordination, kinesthetic sense, posture, core stability and proprioception to improve patient's ability to develop conscious control of individual muscles and awareness of position of extremities in order to progress to PLOF and address remaining functional goals. (see flow sheet as applicable)     Details if applicable:         - 97140 Manual Therapy (timed):  decrease pain and increase ROM to improve patient's ability to progress to PLOF and address remaining functional goals.  The manual therapy interventions were performed at a separate and distinct time from the therapeutic activities interventions . (see flow sheet as applicable)     Details if applicable:      - 16109 Self Care/Home Management (timed):  improve patient knowledge and understanding of pain reducing techniques, positioning, activity modification, diagnosis/prognosis, and  physical therapy expectations, procedures and progression  to improve patient's ability to progress to PLOF and address remaining functional goals.  (see flow sheet as applicable)     Details if applicable:     42      42  MC BC Totals Reminder: bill using total billable min of TIMED therapeutic procedures (example: do not include dry needle or estim unattended, both untimed codes, in totals to left)  8-22 min = 1 unit; 23-37 min = 2 units; 38-52 min = 3 units; 53-67 min = 4 units; 68-82 min = 5 units   Total Total     [x]   Patient Education billed concurrently with other procedures   [x]  Review HEP    []  Progressed/Changed HEP, detail:    []  Other detail:       Objective Information/Functional Measures/Assessment:  Continued challenge with shoulder ABD > FLEXION   Seated rest break required following finger ladder exercise secondary to increased fatigue   Added TB rows/ext for improved parascapular strength   Right shoulder abduction strength improved to 4+/5.  Increased challenge with standing HS curls R>L.   Progress as tolerated.        Patient will continue to benefit from skilled PT / OT services to modify and progress therapeutic interventions, analyze and address functional mobility deficits, analyze and address ROM deficits, analyze and address strength deficits, analyze and address soft tissue restrictions, analyze and cue for proper movement patterns, and  analyze and modify for postural abnormalities to address functional deficits and attain remaining goals.    Progress toward goals / Updated goals:  []   See Progress Note/Recertification  Long term goals to be accomplished in 17 visits:   The patient will perform 5xSTS test in 14 seconds as an indicator of improved functional mobility.   Status at eval: 21 seconds with pain increase   Status at PN (08/27/2022): 19 seconds   Current:    The patient will improve right shoulder flexion AROM to 160 deg and B/L shoulder ABD AROM to 130 deg for improved OH  activity tolerance and ADL's.   Status at eval: right shoulder flexion 140 deg, ABD 90 deg; left shoulder ABD 110 deg with UT compensation  Status at PN (08/27/2022):  flexion 135 deg, ABD 110 deg   Current:   flexion 135 deg 09/03/2022  The patient will improve B/L shoulder flexion and abduction strength to 5/5 for improved tolerance of household duties.    Status at eval: right shoulder flexion 4-/5 abduction 3/5; left shoulder flexion/abduction 4-/5  Status at PN (08/27/2022): flex 4/5 p! ABD 4-/5 p!   Current:  abduction 4+/5 09/11/2022  The patient will improve B/L hip strength globally to 5/5 for improved walking tolerance.   Status at eval: right flexion 3/5, left flexion 4-/5  Status at PN (08/27/2022):  right 4+/5, left 4+/5    Current:    The patient will improve QuickDASH  score to 38% as a functional indicator of improved mobility.    Status at eval: 57%  Status at PN (08/27/2022): 47%   Current:       PLAN  [x]   Continue plan of care  [x]   Upgrade activities as tolerated  []   Discharge due to :  [x]   Other: progress as tolerated NV      Harvel Quale, PT    09/11/2022    12:02 PM    Future Appointments   Date Time Provider Department Center   09/18/2022 11:40 AM Harvel Quale, PT [facility]   09/25/2022 11:40 AM Harvel Quale, PT [facility]

## 2022-09-18 ENCOUNTER — Encounter: Payer: MEDICARE | Primary: Family Medicine

## 2022-09-25 ENCOUNTER — Inpatient Hospital Stay: Admit: 2022-09-25 | Payer: MEDICARE | Primary: Family Medicine

## 2022-09-25 NOTE — Progress Notes (Signed)
[facility]  [address] [place], [place] [address]  Phone: [phone]   [phone]  PHYSICAL THERAPY PROGRESS NOTE  Patient Name: [name] [name] DOB: [birth year]   Treatment/Medical Diagnosis: Muscle weakness (generalized) [M62.81]   Referral Source: Honor Junes, DO     Date of Initial Visit: 07/29/2022 Attended Visits: 10 Missed Visits: 1     SUMMARY OF TREATMENT  The pt has been seen for 10 visits to address generalized weakness, right shoulder pain. Therapeutic interventions have included therapeutic exercise, therapeutic activity, neuromuscular re-education, manual treatment, modalities and patient education.     CURRENT STATUS  % improvement: significant     Current improvements: good compliance with HEP, improved shoulder range of motion and strength   Remaining functional limitations: occasional days of increased pain     QuickDASH: 26%    Objective Information/Functional Measures/Assessment:  The patient presents for reassessment following 10 visits to address generalized weakness, right shoulder pain    improvement in functional mobility indicated by  QuickDASH score change of -22 points to 25%     The patient has maintained compliance with HEP and reports significant improvement in symptoms since starting PT   Held testing for 5xSTS test secondary to increased POTS symptoms today   Right shoulder AROM improved to 145 deg flexion and 120 deg abduction, left ABD AROM improved to 150 deg   Right shoulder strength improved to 5/5 flexion and 4+/5 abduction; left shoulder flexion and ABD 5/5   Secondary to progress towards therapy goals, the patient will be placed on a 30 day hold for a trial of IND management of symptoms with HEP. The patient will be reassessed in 30 days for formal discharge if appropriate, or continue PT 2x/week for remainder of 24 certified visits if continued skilled PT services are required.             PROGRESS  TOWARDS GOALS:  Long term goals to be accomplished in 17 visits:   The patient will perform 5xSTS test in 14 seconds as an indicator of improved functional mobility.   Status at eval: 21 seconds with pain increase   Status at PN (08/27/2022): 19 seconds   Current:  HELD TESTING 09/25/2022  The patient will improve right shoulder flexion AROM to 160 deg and B/L shoulder ABD AROM to 130 deg for improved OH activity tolerance and ADL's.   Status at eval: right shoulder flexion 140 deg, ABD 90 deg; left shoulder ABD 110 deg with UT compensation  Status at PN (08/27/2022):  flexion 135 deg, ABD 110 deg   Current:   flexion 135 deg 09/03/2022, right flexion 145 deg ABD 120 deg and left ABD 150 09/25/2022  The patient will improve B/L shoulder flexion and abduction strength to 5/5 for improved tolerance of household duties.    Status at eval: right shoulder flexion 4-/5 abduction 3/5; left shoulder flexion/abduction 4-/5  Status at PN (08/27/2022): flex 4/5 p! ABD 4-/5 p!   Current:  abduction 4+/5 09/11/2022, right flexion 5/5 and ABD 4+/5 and left 5/5 globally 09/25/2022  The patient will improve B/L hip strength globally to 5/5 for improved walking tolerance.   Status at eval: right flexion 3/5, left flexion 4-/5  Status at PN (08/27/2022):  right 4+/5, left 4+/5    Current:  right 5/5, left unable pain at quad 09/25/2022   The patient will improve QuickDASH  score to 38% as a functional indicator  of improved mobility.    Status at eval: 57%  Status at PN (08/27/2022): 47%   Current:  25%  09/25/2022    LONG-TERM GOALS TO BE ACHIEVED IN 14 treatments   The patient will improve right shoulder ABD to 130 deg for improved OH lifting tolerance.   Status at eval: right shoulder flexion 140 deg, ABD 90 deg; left shoulder ABD 110 deg with UT compensation  Status at PN (08/27/2022):  flexion 135 deg, ABD 110 deg   Status at PN (09/24/2022), right flexion 145 deg ABD 120 deg and left ABD 150 09/25/2022  The patient will improve  right shoulder ABD strength to 5/5 for improved tolerance of household duties.   Status at eval: right shoulder flexion 4-/5 abduction 3/5; left shoulder flexion/abduction 4-/5  Status at PN (08/27/2022): flex 4/5 p! ABD 4-/5 p!   Status at PN (09/24/2022), right flexion 5/5 and ABD 4+/5 and left 5/5 globally 09/25/2022  The patient will improve left hip flexion strength to 5/5 for improved walking tolerance.   Status at eval: right flexion 3/5, left flexion 4-/5  Status at PN (08/27/2022):  right 4+/5, left 4+/5    Status at PN (09/24/2022) right 5/5, left unable pain at quad 09/25/2022   The patient will improve quickDASH score to <15% to improve her functional mobility.   Status at eval: 57%  Status at PN (08/27/2022): 47%   Status at PN (09/24/2022)  25%  09/25/2022  Payor: [employer]ICARE / Plan: [id number] MEDICARE VALUE HMO / Product Type: *No Product type* /     Frequency / Duration:   The patient will be placed on a 30 day hold for a trial of IND management of symptoms with HEP. The patient will be reassessed in 30 days for formal discharge if appropriate, or continue PT 2X/week for remainder of 24 certified visits if continued skilled PT services are required.    :    RECOMMENDATIONS  We would like to continue therapy for progress to goals stated above.  Continue therapy with changes to Plan of Care to include: 30 day hold    If you have any questions/comments please contact us directly.  Thank you for allowing [place] to assist in the care of your patient.    Harvel Quale, PT       09/25/2022       12:31 PM  REPORTING PERIOD:07/29/2022-09/24/2022  ========================================================================================    ___ I have read the above report and request that my patient continue as recommended.   ___ I have read the above report and request that my patient continue therapy with the following changes/special instructions: ________________________________________________   ___ I have read  the above report and request that my patient be discharged from therapy.     Physician's Signature:_________________________   DATE:_________   TIME:________                           Honor Junes, DO    ** Signature, Date and Time must be completed for valid certification **  Please sign and fax to [facility].  Thank you

## 2022-09-25 NOTE — Progress Notes (Signed)
PHYSICAL / OCCUPATIONAL THERAPY - DAILY TREATMENT NOTE (updated 1/23)    Patient Name: [name] [name]    Date: 09/25/2022    DOB: 05/07/1970  Insurance: Payor: [employer] / Plan: [employer] / Product Type: *No Product type* /      Patient DOB verified yes     Visit #   Current / Total 10 24   Time   In / Out 1144 1225    Total Treatment Time 41    Pain   In / Out 2 2    Subjective Functional Status/Changes: % improvement: significant    Current improvements: good compliance with HEP, improved shoulder range of motion and strength   Remaining functional limitations: occasional days of increased pain    QuickDASH: 26%         NEXT PROGRESS NOTE DUE: 09/25/2022    TREATMENT AREA =  Muscle weakness (generalized) [M62.81]    OBJECTIVE    Modalities Rationale:     decrease inflammation and decrease pain to improve patient's ability to progress to PLOF and address remaining functional goals.     min []  Estim Unattended, type/location:                                      []   w/ice    []   w/heat    min []  Estim Attended, type/location:                                     []   w/US     []   w/ice    []   w/heat    []   TENS insruct      min []   Mechanical Traction: type/lbs                   []   pro   []   sup   []   int   []   cont    []   before manual    []   after manual    min []   Ultrasound, settings/location:      min []   Iontophoresis w/ dexamethasone, location:                                               []   take home patch       []   in clinic   PD   min  unbilled []   Ice     [x]   Heat    location/position: Seated to L/S    min []   Paraffin,  details:     min []   Vasopneumatic Device, press/temp:     min []   Whirlpool / Fluido:    If using vaso (only need to measure limb vaso being performed on)      pre-treatment girth :       post-treatment girth :       measured at (landmark location) :      min []   Other:    Skin assessment post-treatment (if applicable):    []   intact    []   redness- no adverse  reaction                 []   redness - adverse reaction:      Vasopnuematic compression justification:  Per bilateral girth measures taken and listed above the edema is considered significant and having an impact on the patient's ADL's     Therapeutic Procedures:  Tx Min Billable or 1:1 Min (if diff from Tx Min) Procedure, Rationale, Specifics   25     25  97110 Therapeutic Exercise (timed):  increase ROM, strength, coordination, balance, and proprioception to improve patient's ability to progress to PLOF and address remaining functional goals. (see flow sheet as applicable)     Details if applicable:    REASSESSMENT, HEP UPDATE AND REVIEW    16    16 97530 Therapeutic Activity (timed):  use of dynamic activities replicating functional movements to increase ROM, strength, coordination, balance, and proprioception in order to improve patient's ability to progress to PLOF and address remaining functional goals.  (see flow sheet as applicable)     Details if applicable:    QUICKDASH  Finger ladder flexion, scaption   Pulley's flexion, ABD           TC:   Step ups fwd   HR/TR  TB rows, ext    x  x  97112 Neuromuscular Re-Education (timed):  improve balance, coordination, kinesthetic sense, posture, core stability and proprioception to improve patient's ability to develop conscious control of individual muscles and awareness of position of extremities in order to progress to PLOF and address remaining functional goals. (see flow sheet as applicable)     Details if applicable:         - 97140 Manual Therapy (timed):  decrease pain and increase ROM to improve patient's ability to progress to PLOF and address remaining functional goals.  The manual therapy interventions were performed at a separate and distinct time from the therapeutic activities interventions . (see flow sheet as applicable)     Details if applicable:      - 53614 Self Care/Home Management (timed):  improve patient knowledge and understanding of pain  reducing techniques, positioning, activity modification, diagnosis/prognosis, and physical therapy expectations, procedures and progression  to improve patient's ability to progress to PLOF and address remaining functional goals.  (see flow sheet as applicable)     Details if applicable:     41      41 MC BC Totals Reminder: bill using total billable min of TIMED therapeutic procedures (example: do not include dry needle or estim unattended, both untimed codes, in totals to left)  8-22 min = 1 unit; 23-37 min = 2 units; 38-52 min = 3 units; 53-67 min = 4 units; 68-82 min = 5 units   Total Total     [x]   Patient Education billed concurrently with other procedures   [x]  Review HEP    []  Progressed/Changed HEP, detail:    []  Other detail:       Objective Information/Functional Measures/Assessment:  The patient presents for reassessment following 10 visits to address generalized weakness, right shoulder pain    improvement in functional mobility indicated by  QuickDASH score change of -22 points to 25%     The patient has maintained compliance with HEP and reports significant improvement in symptoms since starting PT   Held testing for 5xSTS test secondary to increased POTS symptoms today   Right shoulder AROM improved to 145 deg flexion and 120 deg abduction, left ABD AROM improved to 150 deg   Right shoulder strength improved to 5/5 flexion and 4+/5 abduction; left shoulder flexion  and ABD 5/5   Secondary to progress towards therapy goals, the patient will be placed on a 30 day hold for a trial of IND management of symptoms with HEP. The patient will be reassessed in 30 days for formal discharge if appropriate, or continue PT 2x/week for remainder of 24 certified visits if continued skilled PT services are required.        Patient will continue to benefit from skilled PT / OT services to modify and progress therapeutic interventions, analyze and address functional mobility deficits, analyze and address ROM deficits,  analyze and address strength deficits, analyze and address soft tissue restrictions, analyze and cue for proper movement patterns, and analyze and modify for postural abnormalities to address functional deficits and attain remaining goals.    Progress toward goals / Updated goals:  []   See Progress Note/Recertification  Long term goals to be accomplished in 17 visits:   The patient will perform 5xSTS test in 14 seconds as an indicator of improved functional mobility.   Status at eval: 21 seconds with pain increase   Status at PN (08/27/2022): 19 seconds   Current:  HELD TESTING 09/25/2022  The patient will improve right shoulder flexion AROM to 160 deg and B/L shoulder ABD AROM to 130 deg for improved OH activity tolerance and ADL's.   Status at eval: right shoulder flexion 140 deg, ABD 90 deg; left shoulder ABD 110 deg with UT compensation  Status at PN (08/27/2022):  flexion 135 deg, ABD 110 deg   Current:   flexion 135 deg 09/03/2022, right flexion 145 deg ABD 120 deg and left ABD 150 09/25/2022  The patient will improve B/L shoulder flexion and abduction strength to 5/5 for improved tolerance of household duties.    Status at eval: right shoulder flexion 4-/5 abduction 3/5; left shoulder flexion/abduction 4-/5  Status at PN (08/27/2022): flex 4/5 p! ABD 4-/5 p!   Current:  abduction 4+/5 09/11/2022, right flexion 5/5 and ABD 4+/5 and left 5/5 globally 09/25/2022  The patient will improve B/L hip strength globally to 5/5 for improved walking tolerance.   Status at eval: right flexion 3/5, left flexion 4-/5  Status at PN (08/27/2022):  right 4+/5, left 4+/5    Current:  right 5/5, left unable pain at quad 09/25/2022   The patient will improve QuickDASH  score to 38% as a functional indicator of improved mobility.    Status at eval: 57%  Status at PN (08/27/2022): 47%   Current:  25%  09/25/2022    PLAN  [x]   Continue plan of care  [x]   Upgrade activities as tolerated  []   Discharge due to :  [x]   Other: The  patient will be placed on a 30 day hold for a trial of IND management of symptoms with HEP. The patient will be reassessed in 30 days for formal discharge if appropriate, or continue PT 2X/week for remainder of 24 certified visits if continued skilled PT services are required.       Harvel Quale, PT    09/25/2022    11:47 AM    No future appointments.

## 2022-09-25 NOTE — Progress Notes (Signed)
[facility]  [address], [place], [address]  Phone: [phone] Fax [phone]  DISCHARGE SUMMARY  Patient Name: [name] [name] DOB: 06-03-1970   Treatment/Medical Diagnosis: Muscle weakness (generalized) [M62.81]   Referral Source: Honor Junes, DO     Date of Initial Visit: 07/29/22 Attended Visits: 10 Missed Visits: 1     SUMMARY OF TREATMENT  Pt last seen for generalized weakness on 09/25/22. She was placed on hold to trial IND management of symptoms and did not make further FU appointments following that appointment. Therefore, she is to be Dc'd due to length of time since last visit per clinic attendance policy. Please refer to PN on 09/25/22 for final measurements.    RECOMMENDATIONS  Discharge per clinic attendance policy due to length of time since last visit.     If you have any questions/comments please contact us directly at [phone].   Thank you for allowing [place] to assist in the care of your patient.        Therapist Signature: Ewell Poe, PT Date: 03/18/23   Reporting Period: 07/29/22-09/25/22 Time: 10:01 AM

## 2022-10-01 ENCOUNTER — Inpatient Hospital Stay: Admit: 2022-10-01 | Payer: MEDICARE | Primary: Family Medicine

## 2022-11-01 ENCOUNTER — Encounter

## 2022-11-27 ENCOUNTER — Ambulatory Visit: Payer: MEDICARE | Primary: Family Medicine

## 2022-12-04 ENCOUNTER — Ambulatory Visit: Payer: MEDICARE | Primary: Family Medicine

## 2022-12-04 DIAGNOSIS — R1011 Right upper quadrant pain: Secondary | ICD-10-CM

## 2022-12-04 MED ORDER — TECHNETIUM TC 99M MEBROFENIN IV KIT
Freq: Once | INTRAVENOUS | Status: AC | PRN
Start: 2022-12-04 — End: 2022-12-04
  Administered 2022-12-04: 15:00:00 5.5 via INTRAVENOUS

## 2023-01-01 ENCOUNTER — Encounter: Admit: 2023-01-01 | Admitting: Registered Nurse

## 2023-01-01 DIAGNOSIS — N200 Calculus of kidney: Secondary | ICD-10-CM

## 2023-01-08 ENCOUNTER — Inpatient Hospital Stay: Admit: 2023-01-08 | Discharge: 2023-01-08 | Payer: MEDICARE | Primary: Family Medicine

## 2023-01-08 ENCOUNTER — Inpatient Hospital Stay
Admit: 2023-01-08 | Disposition: A | Discharge status: Home / Self Care | Payer: MEDICARE | Admitting: Registered Nurse | Primary: Family Medicine

## 2023-01-08 DIAGNOSIS — K900 Celiac disease: Secondary | ICD-10-CM

## 2023-01-08 LAB — VITAMIN D 25 HYDROXY: Vit D, 25-Hydroxy: 39.5 ng/mL (ref 30.0–100.0)

## 2023-01-09 ENCOUNTER — Encounter: Admit: 2023-01-09 | Admitting: Specialist

## 2023-01-09 ENCOUNTER — Inpatient Hospital Stay
Admit: 2023-01-09 | Disposition: A | Discharge status: Home / Self Care | Payer: MEDICARE | Admitting: Specialist | Primary: Family Medicine

## 2023-01-09 DIAGNOSIS — M810 Age-related osteoporosis without current pathological fracture: Secondary | ICD-10-CM

## 2023-01-09 DIAGNOSIS — Z13820 Encounter for screening for osteoporosis: Secondary | ICD-10-CM

## 2023-01-10 LAB — VITAMIN E
Alpha-Tocopherol: 24.7 mg/L — ABNORMAL HIGH (ref 5.7–19.9)
Gamma-Tocopherol: <1 mg/L (ref <=4.3)

## 2023-01-10 LAB — ZINC: Zinc: 123 ug/dL (ref 60–130)

## 2023-01-10 LAB — VITAMIN A: RETINOL (VITAMIN A): 62 ug/dL (ref 38–98)

## 2023-01-12 LAB — TISSUE TRANSGLUTAMINASE, IGA: TTG IGA AB: <1 U/mL (ref <15.0)

## 2023-02-03 ENCOUNTER — Encounter: Admit: 2023-02-03 | Admitting: Specialist

## 2023-02-03 ENCOUNTER — Encounter: Admit: 2023-02-03 | Admitting: Family Medicine

## 2023-02-03 DIAGNOSIS — Z1231 Encounter for screening mammogram for malignant neoplasm of breast: Secondary | ICD-10-CM

## 2023-02-13 ENCOUNTER — Inpatient Hospital Stay: Admit: 2023-02-13 | Payer: MEDICARE | Primary: Family Medicine

## 2023-02-18 ENCOUNTER — Inpatient Hospital Stay: Admit: 2023-02-18 | Payer: MEDICARE | Primary: Family Medicine

## 2023-02-18 DIAGNOSIS — Z1231 Encounter for screening mammogram for malignant neoplasm of breast: Secondary | ICD-10-CM

## 2023-02-21 ENCOUNTER — Encounter

## 2023-03-17 ENCOUNTER — Inpatient Hospital Stay: Admit: 2023-03-17 | Payer: MEDICARE | Primary: Family Medicine

## 2023-04-15 ENCOUNTER — Ambulatory Visit: Payer: MEDICARE | Primary: Family Medicine

## 2023-04-15 ENCOUNTER — Inpatient Hospital Stay: Admit: 2023-04-15 | Payer: MEDICARE | Primary: Family Medicine

## 2023-04-15 DIAGNOSIS — R92322 Mammographic fibroglandular density, left breast: Secondary | ICD-10-CM

## 2023-04-22 ENCOUNTER — Inpatient Hospital Stay: Admit: 2023-04-22 | Payer: MEDICARE | Primary: Family Medicine

## 2023-04-22 DIAGNOSIS — N6002 Solitary cyst of left breast: Secondary | ICD-10-CM

## 2023-09-16 ENCOUNTER — Inpatient Hospital Stay: Admit: 2023-09-16 | Payer: Medicare (Managed Care) | Primary: Family Medicine
